# Patient Record
Sex: Male | Born: 1980 | Race: White | Hispanic: No | Marital: Married | Smoking: Never smoker
Health system: Southern US, Community
[De-identification: ages and names within clinical notes are randomized; demographics above are authoritative.]

## PROBLEM LIST (undated history)

## (undated) DIAGNOSIS — F419 Anxiety disorder, unspecified: Secondary | ICD-10-CM

## (undated) DIAGNOSIS — J45909 Unspecified asthma, uncomplicated: Secondary | ICD-10-CM

## (undated) DIAGNOSIS — I10 Essential (primary) hypertension: Secondary | ICD-10-CM

## (undated) DIAGNOSIS — K219 Gastro-esophageal reflux disease without esophagitis: Secondary | ICD-10-CM

## (undated) DIAGNOSIS — H3560 Retinal hemorrhage, unspecified eye: Secondary | ICD-10-CM

## (undated) DIAGNOSIS — K602 Anal fissure, unspecified: Secondary | ICD-10-CM

## (undated) DIAGNOSIS — T7840XA Allergy, unspecified, initial encounter: Secondary | ICD-10-CM

## (undated) DIAGNOSIS — B019 Varicella without complication: Secondary | ICD-10-CM

## (undated) HISTORY — DX: Allergy, unspecified, initial encounter: T78.40XA

## (undated) HISTORY — DX: Anal fissure, unspecified: K60.2

## (undated) HISTORY — DX: Varicella without complication: B01.9

## (undated) HISTORY — PX: PLANTAR FASCIA RELEASE: SHX2239

## (undated) HISTORY — DX: Gastro-esophageal reflux disease without esophagitis: K21.9

## (undated) HISTORY — DX: Unspecified asthma, uncomplicated: J45.909

## (undated) HISTORY — PX: NO PAST SURGERIES: SHX2092

## (undated) HISTORY — PX: HERNIA REPAIR: SHX51

## (undated) HISTORY — DX: Essential (primary) hypertension: I10

## (undated) HISTORY — DX: Anxiety disorder, unspecified: F41.9

## (undated) HISTORY — DX: Retinal hemorrhage, unspecified eye: H35.60

---

## 2017-09-02 DIAGNOSIS — H3560 Retinal hemorrhage, unspecified eye: Secondary | ICD-10-CM

## 2017-09-02 HISTORY — DX: Retinal hemorrhage, unspecified eye: H35.60

## 2017-09-02 LAB — HM DIABETES EYE EXAM

## 2017-09-12 ENCOUNTER — Ambulatory Visit (INDEPENDENT_AMBULATORY_CARE_PROVIDER_SITE_OTHER): Payer: Managed Care, Other (non HMO) | Admitting: Family Medicine

## 2017-09-12 ENCOUNTER — Encounter: Payer: Self-pay | Admitting: Family Medicine

## 2017-09-12 VITALS — BP 143/82 | HR 58 | Temp 98.1°F | Resp 20 | Ht 74.5 in | Wt 383.0 lb

## 2017-09-12 DIAGNOSIS — Z131 Encounter for screening for diabetes mellitus: Secondary | ICD-10-CM | POA: Diagnosis not present

## 2017-09-12 DIAGNOSIS — Q159 Congenital malformation of eye, unspecified: Secondary | ICD-10-CM | POA: Insufficient documentation

## 2017-09-12 DIAGNOSIS — I10 Essential (primary) hypertension: Secondary | ICD-10-CM | POA: Diagnosis not present

## 2017-09-12 DIAGNOSIS — Z7689 Persons encountering health services in other specified circumstances: Secondary | ICD-10-CM

## 2017-09-12 LAB — TSH: TSH: 1.13 u[IU]/mL (ref 0.35–4.50)

## 2017-09-12 LAB — COMPREHENSIVE METABOLIC PANEL
ALBUMIN: 4.4 g/dL (ref 3.5–5.2)
ALK PHOS: 55 U/L (ref 39–117)
ALT: 48 U/L (ref 0–53)
AST: 29 U/L (ref 0–37)
BUN: 13 mg/dL (ref 6–23)
CALCIUM: 9.4 mg/dL (ref 8.4–10.5)
CO2: 26 mEq/L (ref 19–32)
CREATININE: 0.87 mg/dL (ref 0.40–1.50)
Chloride: 106 mEq/L (ref 96–112)
GFR: 105.37 mL/min (ref >60.00)
Glucose, Bld: 94 mg/dL (ref 70–99)
Potassium: 4.5 mEq/L (ref 3.5–5.1)
SODIUM: 141 meq/L (ref 135–145)
Total Bilirubin: 0.6 mg/dL (ref 0.2–1.2)
Total Protein: 7 g/dL (ref 6.0–8.3)

## 2017-09-12 LAB — HEMOGLOBIN A1C: Hgb A1c MFr Bld: 5.9 % (ref 4.6–6.5)

## 2017-09-12 LAB — LIPID PANEL
CHOLESTEROL: 180 mg/dL (ref 0–200)
HDL: 36.5 mg/dL — ABNORMAL LOW (ref >39.00)
LDL Cholesterol: 123 mg/dL — ABNORMAL HIGH (ref 0–99)
NonHDL: 143.52
TRIGLYCERIDES: 101 mg/dL (ref 0.0–149.0)
Total CHOL/HDL Ratio: 5
VLDL: 20.2 mg/dL (ref 0.0–40.0)

## 2017-09-12 LAB — CBC
HCT: 48.7 % (ref 39.0–52.0)
HEMOGLOBIN: 16.4 g/dL (ref 13.0–17.0)
MCHC: 33.6 g/dL (ref 30.0–36.0)
MCV: 83.9 fl (ref 78.0–100.0)
Platelets: 266 10*3/uL (ref 150.0–400.0)
RBC: 5.81 Mil/uL (ref 4.22–5.81)
RDW: 13.9 % (ref 11.5–15.5)
WBC: 8.4 10*3/uL (ref 4.0–10.5)

## 2017-09-12 LAB — MICROALBUMIN / CREATININE URINE RATIO
Creatinine,U: 109.5 mg/dL
Microalb Creat Ratio: 0.6 mg/g (ref 0.0–30.0)

## 2017-09-12 MED ORDER — LISINOPRIL 5 MG PO TABS: 5.0000 mg | ORAL_TABLET | Freq: Every day | ORAL | 0 refills | Status: DC

## 2017-09-12 NOTE — Progress Notes (Signed)
Patient ID: Nicholas Anthony, male  DOB: 1980/09/05, 37 y.o.   MRN: [id number] Patient Care Team    Relationship Specialty Notifications Start End  Ma Hillock, DO PCP - General Family Medicine  09/12/17     Chief Complaint  Patient presents with  . Establish Care    Subjective:  Nicholas Anthony is a 37 y.o.  male present for new patient establishment. All past medical history, surgical history, allergies, family history, immunizations, medications and social history were obtained in the electronic medical record today. All recent labs, ED visits and hospitalizations within the last year were reviewed.  Hypertension/morbid obesity/ophthalmological abnormality:  New onset hypertension with elevated readings today, at prior UC setting and at home. recently at his eye doctor that told him he had "blood behind his eye" and needed to get checked. Patient denies chest pain, shortness of breath or lower extremity edema. Pt does not take a  daily baby ASA. Pt is is not  prescribed statin. BMP: ordered today CBC: ordered today Lipids: ordered today TSH: ordered today Urine microalb: ordered today Diet: low sodium Exercise: encouraged routinely.  RF: HTN, obesity, Fhx heart disease  Depression screen [facility] 2/9 09/12/2017  Decreased Interest 0  Down, Depressed, Hopeless 0  PHQ - 2 Score 0   No flowsheet data found.    Current Exercise Habits: The patient does not participate in regular exercise at present Exercise limited by: None identified Fall Risk  09/12/2017  Falls in the past year? No     Immunization History  Administered Date(s) Administered  . Influenza Split 03/30/2014  . Tdap 03/30/2014    No exam data present  Past Medical History:  Diagnosis Date  . Allergy   . Asthma   . Chicken pox    Allergies  Allergen Reactions  . Penicillins Other (See Comments)    Unsure has not taken since childhood Told he was allergic as a child Told he was allergic as a  child    Past Surgical History:  Procedure Laterality Date  . NO PAST SURGERIES     Family History  Problem Relation Age of Onset  . Diabetes Father   . Heart disease Father   . Hyperlipidemia Father   . Hypertension Father   . Drug abuse Sister   . Alcohol abuse Sister   . Arthritis Paternal Grandmother   . Depression Paternal Grandmother   . Diabetes Paternal Grandmother   . Hyperlipidemia Paternal Grandmother   . Heart disease Paternal Grandmother   . Hypertension Paternal Grandmother   . Arthritis Paternal Grandfather   . Hearing loss Paternal Grandfather   . Heart disease Paternal Grandfather   . Hyperlipidemia Paternal Grandfather   . Hypertension Paternal Grandfather   . Kidney disease Paternal Grandfather   . Drug abuse Brother    Social History   Socioeconomic History  . Marital status: Married    Spouse name: Not on file  . Number of children: Not on file  . Years of education: Not on file  . Highest education level: Not on file  Occupational History  . Not on file  Social Needs  . Financial resource strain: Not on file  . Food insecurity:    Worry: Not on file    Inability: Not on file  . Transportation needs:    Medical: Not on file    Non-medical: Not on file  Tobacco Use  . Smoking status: Never Smoker  . Smokeless tobacco: Never Used  Substance and Sexual Activity  . Alcohol use: Never    Frequency: Never  . Drug use: Never  . Sexual activity: Yes  Lifestyle  . Physical activity:    Days per week: Not on file    Minutes per session: Not on file  . Stress: Not on file  Relationships  . Social connections:    Talks on phone: Not on file    Gets together: Not on file    Attends religious service: Not on file    Active member of club or organization: Not on file    Attends meetings of clubs or organizations: Not on file    Relationship status: Not on file  . Intimate partner violence:    Fear of current or ex partner: Not on file     Emotionally abused: Not on file    Physically abused: Not on file    Forced sexual activity: Not on file  Other Topics Concern  . Not on file  Social History Narrative  . Not on file   Allergies as of 09/12/2017      Reactions   Penicillins Other (See Comments)   Unsure has not taken since childhood Told he was allergic as a child Told he was allergic as a child      Medication List        Accurate as of 09/12/17  5:08 PM. Always use your most recent med list.          lisinopril 5 MG tablet Commonly known as:  PRINIVIL,ZESTRIL Take 1 tablet (5 mg total) by mouth daily.       All past medical history, surgical history, allergies, family history, immunizations andmedications were updated in the EMR today and reviewed under the history and medication portions of their EMR.    No results found for this or any previous visit (from the past 2160 hour(s)).  Patient was never admitted.   ROS: 14 pt review of systems performed and negative (unless mentioned in an HPI)  Objective: BP (!) 143/82 (BP Location: Left Arm, Patient Position: Sitting, Cuff Size: Large)   Pulse (!) 58   Temp 98.1 F (36.7 C)   Resp 20   Ht 6' 2.5" (1.892 m)   Wt (!) 383 lb (173.7 kg)   SpO2 98%   BMI 48.52 kg/m  Gen: Afebrile. No acute distress. Nontoxic in appearance, well-developed, well-nourished, morbid obesity, very pleasant, caucasian male.  HENT: AT. [place].  MMM, no oral lesions, adequate dentition.no Cough on exam, no hoarseness on exam. Eyes:Pupils Equal Round Reactive to light, Extraocular movements intact,  Conjunctiva without redness, discharge or icterus. Neck/lymp/endocrine: Supple,no lymphadenopathy, no thyromegaly CV: RRR no murmur, no edema, +2/4 P posterior tibialis pulses. no carotid bruits. No JVD. Chest: CTAB, no wheeze, rhonchi or crackles. normal Respiratory effort. good Air movement. Abd: Soft. obese. NTND. BS present. no Masses palpated. No hepatosplenomegaly. No rebound  tenderness or guarding. Skin: no rashes, purpura or petechiae. Warm and well-perfused. Skin intact. Neuro/Msk:  Normal gait. PERLA. EOMi. Alert. Oriented x3.  Cranial nerves II through XII intact. Muscle strength 5/5 upper/lower extremity. DTRs equal bilaterally. Psych: Normal affect, dress and demeanor. Normal speech. Normal thought content and judgment.   Assessment/plan: Nicholas Anthony is a 37 y.o. male present for establishment with new onset HTN. Essential hypertension/Ophthalmologic abnormality/ Morbid obesity ([place]) - start lisinopril 5 mg QD. - low sodium, increase exercise > 150 minutes a week.  - CBC - Comp Met (CMET) - Lipid panel -  TSH - Urine Microalbumin w/creat. ratio - HgB A1c - f/u 2 weeks with provider appt.    Return in about 2 weeks (around 09/26/2017) for HTN'.   Note is dictated utilizing voice recognition software. Although note has been proof read prior to signing, occasional typographical errors still can be missed. If any questions arise, please do not hesitate to call for verification.  Electronically signed by: Howard Pouch, DO [place]

## 2017-09-12 NOTE — Patient Instructions (Signed)
Start lisinopril 5 mg a day. Follow up in 2 weeks for provider appt.  We will call you with all lab results.    Hypertension Hypertension is another name for high blood pressure. High blood pressure forces your heart to work harder to pump blood. This can cause problems over time. There are two numbers in a blood pressure reading. There is a top number (systolic) over a bottom number (diastolic). It is best to have a blood pressure below 120/80. Healthy choices can help lower your blood pressure. You may need medicine to help lower your blood pressure if:  Your blood pressure cannot be lowered with healthy choices.  Your blood pressure is higher than 130/80.  Follow these instructions at home: Eating and drinking  If directed, follow the DASH eating plan. This diet includes: ? Filling half of your plate at each meal with fruits and vegetables. ? Filling one quarter of your plate at each meal with whole grains. Whole grains include whole wheat pasta, brown rice, and whole grain bread. ? Eating or drinking low-fat dairy products, such as skim milk or low-fat yogurt. ? Filling one quarter of your plate at each meal with low-fat (lean) proteins. Low-fat proteins include fish, skinless chicken, eggs, beans, and tofu. ? Avoiding fatty meat, cured and processed meat, or chicken with skin. ? Avoiding premade or processed food.  Eat less than 1,500 mg of salt (sodium) a day.  Limit alcohol use to no more than 1 drink a day for nonpregnant women and 2 drinks a day for men. One drink equals 12 oz of beer, 5 oz of wine, or 1 oz of hard liquor. Lifestyle  Work with your doctor to stay at a healthy weight or to lose weight. Ask your doctor what the best weight is for you.  Get at least 30 minutes of exercise that causes your heart to beat faster (aerobic exercise) most days of the week. This may include walking, swimming, or biking.  Get at least 30 minutes of exercise that strengthens your  muscles (resistance exercise) at least 3 days a week. This may include lifting weights or pilates.  Do not use any products that contain nicotine or tobacco. This includes cigarettes and e-cigarettes. If you need help quitting, ask your doctor.  Check your blood pressure at home as told by your doctor.  Keep all follow-up visits as told by your doctor. This is important. Medicines  Take over-the-counter and prescription medicines only as told by your doctor. Follow directions carefully.  Do not skip doses of blood pressure medicine. The medicine does not work as well if you skip doses. Skipping doses also puts you at risk for problems.  Ask your doctor about side effects or reactions to medicines that you should watch for. Contact a doctor if:  You think you are having a reaction to the medicine you are taking.  You have headaches that keep coming back (recurring).  You feel dizzy.  You have swelling in your ankles.  You have trouble with your vision. Get help right away if:  You get a very bad headache.  You start to feel confused.  You feel weak or numb.  You feel faint.  You get very bad pain in your: ? Chest. ? Belly (abdomen).  You throw up (vomit) more than once.  You have trouble breathing. Summary  Hypertension is another name for high blood pressure.  Making healthy choices can help lower blood pressure. If your blood pressure cannot  be controlled with healthy choices, you may need to take medicine. This information is not intended to replace advice given to you by your health care provider. Make sure you discuss any questions you have with your health care provider. Document Released: 08/25/2007 Document Revised: 02/04/2016 Document Reviewed: 02/04/2016 Elsevier Interactive Patient Education  [employer].

## 2017-09-13 ENCOUNTER — Encounter: Payer: Self-pay | Admitting: *Deleted

## 2017-09-13 ENCOUNTER — Telehealth: Payer: Self-pay | Admitting: Family Medicine

## 2017-09-13 NOTE — Telephone Encounter (Signed)
Please inform patient the following information: His labs overall look good.  - is a1c (diabetes screen) is 5.9, which does place him in the prediabetes range. No need for medication yet. Exercising > 150 min a week and lower sugar/carbohydrate meals will help decrease his chances of becoming a diabetic. We can discuss dietary modifications if he desires, at his follow up in a couple weeks.  His cholesterol is ok. He could use an increase in his "good" cholesterol, which is can be achieved by eating foods such as olive oil, salmon, whole grains, beans/legumes and exercise. Fish oil supplement of 1000 mg a day can be helpful.  Kidney function is good.  See him in 2 weeks at his follow up and we can discuss in more detail if he wants

## 2017-09-14 ENCOUNTER — Encounter: Payer: Self-pay | Admitting: *Deleted

## 2017-09-14 NOTE — Telephone Encounter (Signed)
Called patient left message for patient to return call 

## 2017-09-14 NOTE — Telephone Encounter (Signed)
Spoke with patient reviewed lab results and instructions. Patient verbalized understanding. 

## 2017-09-28 ENCOUNTER — Encounter: Payer: Self-pay | Admitting: Family Medicine

## 2017-09-28 ENCOUNTER — Ambulatory Visit (INDEPENDENT_AMBULATORY_CARE_PROVIDER_SITE_OTHER): Payer: Managed Care, Other (non HMO) | Admitting: Family Medicine

## 2017-09-28 VITALS — BP 129/84 | HR 70 | Resp 16 | Ht 74.5 in | Wt 369.0 lb

## 2017-09-28 DIAGNOSIS — I10 Essential (primary) hypertension: Secondary | ICD-10-CM | POA: Diagnosis not present

## 2017-09-28 DIAGNOSIS — R7303 Prediabetes: Secondary | ICD-10-CM

## 2017-09-28 MED ORDER — LISINOPRIL 5 MG PO TABS: 5.0000 mg | ORAL_TABLET | Freq: Every day | ORAL | 1 refills | Status: DC

## 2017-09-28 MED ORDER — TRIAMCINOLONE ACETONIDE 0.1 % EX CREA: 1.0000 "application " | TOPICAL_CREAM | Freq: Two times a day (BID) | CUTANEOUS | 0 refills | Status: DC

## 2017-09-28 NOTE — Progress Notes (Signed)
Patient ID: Nicholas Anthony, male  DOB: 06-24-1980, 37 y.o.   MRN: [id number] Patient Care Team    Relationship Specialty Notifications Start End  Ma Hillock, DO PCP - General Family Medicine  09/12/17     Chief Complaint  Patient presents with  . Follow-up    HTN    Subjective:  Nicholas Anthony is a 37 y.o.  male present for follow-up on hypertension  Hypertension/morbid obesity/ophthalmological abnormality:  New onset hypertension last visit.  Started lisinopril 5 mg daily last visit.  Patient is tolerating medication well.  He has started walking every evening and has cut back on carbohydrates and sugars in his diet.  Patient denies chest pain, shortness of breath, dizziness or lower extremity edema.  He has lost 14 pounds in the last 4 weeks. Pt does not take a  daily baby ASA. Pt is is not  prescribed statin. BMP: 09/12/2017 within normal limits CBC: 09/12/2017 within normal limits Lipids:09/12/2017 cholesterol 180, HDL 36, LDL 123, triglycerides 101 TSH: 09/12/2017 1.13 Microalbumin: 09/12/2017 within normal limits Urine microalb: ordered today Diet: low sodium Exercise: encouraged routinely.  RF: HTN, obesity, Fhx heart disease  Prediabetes: Mildly elevated A1c 09/12/2017 of 5.9, fasting glucose normal at 94.  Eczema: Patient reports long-standing history of eczema, mostly affects left palm.  Used steroid cream in the past and would like refills.  Depression screen Nicholas Anthony 2/9 09/12/2017  Decreased Interest 0  Down, Depressed, Hopeless 0  PHQ - 2 Score 0   No flowsheet data found.     Fall Risk  09/12/2017  Falls in the past year? No     Immunization History  Administered Date(s) Administered  . Influenza Split 03/30/2014  . Tdap 03/30/2014    No exam data present  Past Medical History:  Diagnosis Date  . Allergy   . Asthma   . Chicken pox   . Retinal hemorrhage 09/02/2017   Allergies  Allergen Reactions  . Penicillins Other (See Comments)    Unsure  has not taken since childhood Told he was allergic as a child Told he was allergic as a child    Past Surgical History:  Procedure Laterality Date  . NO PAST SURGERIES     Family History  Problem Relation Age of Onset  . Diabetes Father   . Heart disease Father   . Hyperlipidemia Father   . Hypertension Father   . Drug abuse Sister   . Alcohol abuse Sister   . Arthritis Paternal Grandmother   . Depression Paternal Grandmother   . Diabetes Paternal Grandmother   . Hyperlipidemia Paternal Grandmother   . Heart disease Paternal Grandmother   . Hypertension Paternal Grandmother   . Arthritis Paternal Grandfather   . Hearing loss Paternal Grandfather   . Heart disease Paternal Grandfather   . Hyperlipidemia Paternal Grandfather   . Hypertension Paternal Grandfather   . Kidney disease Paternal Grandfather   . Drug abuse Brother    Social History   Socioeconomic History  . Marital status: Married    Spouse name: Not on file  . Number of children: Not on file  . Years of education: Not on file  . Highest education level: Not on file  Occupational History  . Not on file  Social Needs  . Financial resource strain: Not on file  . Food insecurity:    Worry: Not on file    Inability: Not on file  . Transportation needs:    Medical: Not on  file    Non-medical: Not on file  Tobacco Use  . Smoking status: Never Smoker  . Smokeless tobacco: Never Used  Substance and Sexual Activity  . Alcohol use: Never    Frequency: Never  . Drug use: Never  . Sexual activity: Yes    Partners: Female  Lifestyle  . Physical activity:    Days per week: Not on file    Minutes per session: Not on file  . Stress: Not on file  Relationships  . Social connections:    Talks on phone: Not on file    Gets together: Not on file    Attends religious service: Not on file    Active member of club or organization: Not on file    Attends meetings of clubs or organizations: Not on file     Relationship status: Not on file  . Intimate partner violence:    Fear of current or ex partner: Not on file    Emotionally abused: Not on file    Physically abused: Not on file    Forced sexual activity: Not on file  Other Topics Concern  . Not on file  Social History Narrative   Married, employed,    In college. Currently working as a Engineer, building services.    Drinks caffeine.    Smoke alarm in the home. Wears his seatbelt.    Feels safe in his relationship.    Allergies as of 09/28/2017      Reactions   Penicillins Other (See Comments)   Unsure has not taken since childhood Told he was allergic as a child Told he was allergic as a child      Medication List        Accurate as of 09/28/17  9:32 AM. Always use your most recent med list.          lisinopril 5 MG tablet Commonly known as:  PRINIVIL,ZESTRIL Take 1 tablet (5 mg total) by mouth daily.       All past medical history, surgical history, allergies, family history, immunizations andmedications were updated in the EMR today and reviewed under the history and medication portions of their EMR.    Recent Results (from the past 2160 hour(s))  HM DIABETES EYE EXAM     Status: None   Collection Time: 09/02/17 12:00 AM  Result Value Ref Range   HM Diabetic Eye Exam No Retinopathy No Retinopathy  CBC     Status: None   Collection Time: 09/12/17 10:03 AM  Result Value Ref Range   WBC 8.4 4.0 - 10.5 K/uL   RBC 5.81 4.22 - 5.81 Mil/uL   Platelets 266.0 150.0 - 400.0 K/uL   Hemoglobin 16.4 13.0 - 17.0 g/dL   HCT 48.7 39.0 - 52.0 %   MCV 83.9 78.0 - 100.0 fl   MCHC 33.6 30.0 - 36.0 g/dL   RDW 13.9 11.5 - 15.5 %  HgB A1c     Status: None   Collection Time: 09/12/17 10:03 AM  Result Value Ref Range   Hgb A1c MFr Bld 5.9 4.6 - 6.5 %    Comment: Glycemic Control Guidelines for People with Diabetes:Non Diabetic:  <6%Goal of Therapy: <7%Additional Action Suggested:  >8%   Comp Met (CMET)     Status: None   Collection Time:  09/12/17 10:03 AM  Result Value Ref Range   Sodium 141 135 - 145 mEq/L   Potassium 4.5 3.5 - 5.1 mEq/L   Chloride 106 96 - 112 mEq/L  CO2 26 19 - 32 mEq/L   Glucose, Bld 94 70 - 99 mg/dL   BUN 13 6 - 23 mg/dL   Creatinine, Ser 0.87 0.40 - 1.50 mg/dL   Total Bilirubin 0.6 0.2 - 1.2 mg/dL   Alkaline Phosphatase 55 39 - 117 U/L   AST 29 0 - 37 U/L   ALT 48 0 - 53 U/L   Total Protein 7.0 6.0 - 8.3 g/dL   Albumin 4.4 3.5 - 5.2 g/dL   Calcium 9.4 8.4 - 10.5 mg/dL   GFR 105.37 >60.00 mL/min  Lipid panel     Status: Abnormal   Collection Time: 09/12/17 10:03 AM  Result Value Ref Range   Cholesterol 180 0 - 200 mg/dL    Comment: ATP III Classification       Desirable:  < 200 mg/dL               Borderline High:  200 - 239 mg/dL          High:  > = 240 mg/dL   Triglycerides 101.0 0.0 - 149.0 mg/dL    Comment: Normal:  <150 mg/dLBorderline High:  150 - 199 mg/dL   HDL 36.50 (L) >39.00 mg/dL   VLDL 20.2 0.0 - 40.0 mg/dL   LDL Cholesterol 123 (H) 0 - 99 mg/dL   Total CHOL/HDL Ratio 5     Comment:                Men          Women1/2 Average Risk     3.4          3.3Average Risk          5.0          4.42X Average Risk          9.6          7.13X Average Risk          15.0          11.0                       NonHDL 143.52     Comment: NOTE:  Non-HDL goal should be 30 mg/dL higher than patient's LDL goal (i.e. LDL goal of < 70 mg/dL, would have non-HDL goal of < 100 mg/dL)  TSH     Status: None   Collection Time: 09/12/17 10:03 AM  Result Value Ref Range   TSH 1.13 0.35 - 4.50 uIU/mL  Urine Microalbumin w/creat. ratio     Status: None   Collection Time: 09/12/17 10:03 AM  Result Value Ref Range   Microalb, Ur <0.7 0.0 - 1.9 mg/dL   Creatinine,U 109.5 mg/dL   Microalb Creat Ratio 0.6 0.0 - 30.0 mg/g    Patient was never admitted.   ROS: 14 pt review of systems performed and negative (unless mentioned in an HPI)  Objective: BP 129/84 (BP Location: Left Arm, Patient Position: Sitting,  Cuff Size: Large)   Pulse 70   Resp 16   Ht 6' 2.5" (1.892 m)   Wt (!) 369 lb (167.4 kg)   SpO2 97%   BMI 46.74 kg/m  Gen: Afebrile. No acute distress.  Nontoxic in appearance, well-developed, well-nourished, very pleasant obese Caucasian male. HENT: AT. [place].  MMM.  Eyes:Pupils Equal Round Reactive to light, Extraocular movements intact,  Conjunctiva without redness, discharge or icterus. Neck/lymp/endocrine: Supple, no lymphadenopathy, no thyromegaly CV: RRR no murmur, no edema, +2/4  P posterior tibialis pulses Chest: CTAB, no wheeze or crackles Abd: Soft.  Obese. NTND. BS active.  Skin: dry flaky skin left palm.  Neuro:  Normal gait. PERLA. EOMi. Alert. Oriented.  Psych: Normal affect, dress and demeanor. Normal speech. Normal thought content and judgment.  Assessment/plan: Jancarlos Thrun is a 37 y.o. male present for establishment with new onset HTN. Essential hypertension/Ophthalmologic abnormality/ Morbid obesity ([place]) -Stable.  Continue lisinopril 5 mg QD.  Refills provided today. - low sodium, increase exercise > 150 minutes a week.  Doing great has lost 14 pounds. -Follow-up in 6 months.  Eczema: -New problem to this provider, chronic condition to patient.  Has used steroid cream in the past.  Will provide Kenalog cream.  Recommended patient use cetaphil or cerave cream after showers.    Return in about 6 months (around 03/31/2018) for HTN'.   Note is dictated utilizing voice recognition software. Although note has been proof read prior to signing, occasional typographical errors still can be missed. If any questions arise, please do not hesitate to call for verification.  Electronically signed by: Howard Pouch, DO [place]

## 2017-09-28 NOTE — Patient Instructions (Signed)
Great job on the weight loss. Refilled medicines for you. Follow up in 6 months.    Hypertension Hypertension is another name for high blood pressure. High blood pressure forces your heart to work harder to pump blood. This can cause problems over time. There are two numbers in a blood pressure reading. There is a top number (systolic) over a bottom number (diastolic). It is best to have a blood pressure below 120/80. Healthy choices can help lower your blood pressure. You may need medicine to help lower your blood pressure if:  Your blood pressure cannot be lowered with healthy choices.  Your blood pressure is higher than 130/80.  Follow these instructions at home: Eating and drinking  If directed, follow the DASH eating plan. This diet includes: ? Filling half of your plate at each meal with fruits and vegetables. ? Filling one quarter of your plate at each meal with whole grains. Whole grains include whole wheat pasta, brown rice, and whole grain bread. ? Eating or drinking low-fat dairy products, such as skim milk or low-fat yogurt. ? Filling one quarter of your plate at each meal with low-fat (lean) proteins. Low-fat proteins include fish, skinless chicken, eggs, beans, and tofu. ? Avoiding fatty meat, cured and processed meat, or chicken with skin. ? Avoiding premade or processed food.  Eat less than 1,500 mg of salt (sodium) a day.  Limit alcohol use to no more than 1 drink a day for nonpregnant women and 2 drinks a day for men. One drink equals 12 oz of beer, 5 oz of wine, or 1 oz of hard liquor. Lifestyle  Work with your doctor to stay at a healthy weight or to lose weight. Ask your doctor what the best weight is for you.  Get at least 30 minutes of exercise that causes your heart to beat faster (aerobic exercise) most days of the week. This may include walking, swimming, or biking.  Get at least 30 minutes of exercise that strengthens your muscles (resistance exercise) at  least 3 days a week. This may include lifting weights or pilates.  Do not use any products that contain nicotine or tobacco. This includes cigarettes and e-cigarettes. If you need help quitting, ask your doctor.  Check your blood pressure at home as told by your doctor.  Keep all follow-up visits as told by your doctor. This is important. Medicines  Take over-the-counter and prescription medicines only as told by your doctor. Follow directions carefully.  Do not skip doses of blood pressure medicine. The medicine does not work as well if you skip doses. Skipping doses also puts you at risk for problems.  Ask your doctor about side effects or reactions to medicines that you should watch for. Contact a doctor if:  You think you are having a reaction to the medicine you are taking.  You have headaches that keep coming back (recurring).  You feel dizzy.  You have swelling in your ankles.  You have trouble with your vision. Get help right away if:  You get a very bad headache.  You start to feel confused.  You feel weak or numb.  You feel faint.  You get very bad pain in your: ? Chest. ? Belly (abdomen).  You throw up (vomit) more than once.  You have trouble breathing. Summary  Hypertension is another name for high blood pressure.  Making healthy choices can help lower blood pressure. If your blood pressure cannot be controlled with healthy choices, you may need  to take medicine. This information is not intended to replace advice given to you by your health care provider. Make sure you discuss any questions you have with your health care provider. Document Released: 08/25/2007 Document Revised: 02/04/2016 Document Reviewed: 02/04/2016 Elsevier Interactive Patient Education  [employer].

## 2017-10-03 ENCOUNTER — Encounter: Payer: Self-pay | Admitting: Family Medicine

## 2017-10-03 DIAGNOSIS — R7303 Prediabetes: Secondary | ICD-10-CM | POA: Insufficient documentation

## 2017-10-03 HISTORY — DX: Prediabetes: R73.03

## 2017-11-25 ENCOUNTER — Encounter: Payer: Self-pay | Admitting: Family Medicine

## 2017-11-25 ENCOUNTER — Ambulatory Visit (INDEPENDENT_AMBULATORY_CARE_PROVIDER_SITE_OTHER): Payer: Managed Care, Other (non HMO) | Admitting: Family Medicine

## 2017-11-25 VITALS — BP 134/84 | HR 76 | Temp 97.9°F | Resp 20 | Ht 75.0 in | Wt 349.2 lb

## 2017-11-25 DIAGNOSIS — I10 Essential (primary) hypertension: Secondary | ICD-10-CM | POA: Diagnosis not present

## 2017-11-25 DIAGNOSIS — R41840 Attention and concentration deficit: Secondary | ICD-10-CM

## 2017-11-25 DIAGNOSIS — Z23 Encounter for immunization: Secondary | ICD-10-CM | POA: Diagnosis not present

## 2017-11-25 DIAGNOSIS — F419 Anxiety disorder, unspecified: Secondary | ICD-10-CM | POA: Insufficient documentation

## 2017-11-25 HISTORY — DX: Attention and concentration deficit: R41.840

## 2017-11-25 MED ORDER — LISINOPRIL 10 MG PO TABS: 10.0000 mg | ORAL_TABLET | Freq: Every day | ORAL | 1 refills | Status: DC

## 2017-11-25 MED ORDER — VENLAFAXINE HCL ER 37.5 MG PO CP24: ORAL_CAPSULE | ORAL | 0 refills | Status: DC

## 2017-11-25 NOTE — Progress Notes (Addendum)
Nicholas Anthony , [birth year], 37 y.o., male MRN: [id number] Patient Care Team    Relationship Specialty Notifications Start End  Natalia Leatherwood, DO PCP - General Family Medicine  09/12/17     Chief Complaint  Patient presents with  . trouble focusing    wants to be evaluated for ADHD     Subjective: Pt presents for an OV with complaints of occult he staying focused.  He states he had trouble as a kid but was never formally diagnosed with ADHD or had treatment.  His brothers were diagnosed with ADHD and had treatment.  He states he has gone back to school and is having difficulty staying focused.  He reports that his mind is racing and he has trouble staying on tasks and getting assignments completed.  Has difficulty retaining what he reads because he is thinking of other things as he is reading.  He reports he is unable to "shut off the other parts of his brain "when needing to focus.    Depression screen [facility] 2/9 09/12/2017  Decreased Interest 0  Down, Depressed, Hopeless 0  PHQ - 2 Score 0    Allergies  Allergen Reactions  . Penicillins Other (See Comments)    Unsure has not taken since childhood Told he was allergic as a child Told he was allergic as a child    Social History   Tobacco Use  . Smoking status: Never Smoker  . Smokeless tobacco: Never Used  Substance Use Topics  . Alcohol use: Never    Frequency: Never   Past Medical History:  Diagnosis Date  . Allergy   . Asthma   . Chicken pox   . 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 NO PAST SURGERIES     Family History  Problem Relation Age of Onset  . Diabetes Father   . Heart disease Father   . Hyperlipidemia Father   . Hypertension Father   . Drug abuse Sister   . Alcohol abuse Sister   . Arthritis Paternal Grandmother   . Depression Paternal Grandmother   . Diabetes Paternal Grandmother   . Hyperlipidemia Paternal Grandmother   . Heart disease Paternal  Grandmother   . Hypertension Paternal Grandmother   . Arthritis Paternal Grandfather   . Hearing loss Paternal Grandfather   . Heart disease Paternal Grandfather   . Hyperlipidemia Paternal Grandfather   . Hypertension Paternal Grandfather   . Kidney disease Paternal Grandfather   . Drug abuse Brother    Allergies as of 11/25/2017      Reactions   Penicillins Other (See Comments)   Unsure has not taken since childhood Told he was allergic as a child Told he was allergic as a child      Medication List        Accurate as of 11/25/17  3:24 PM. Always use your most recent med list.          lisinopril 5 MG tablet Commonly known as:  PRINIVIL,ZESTRIL Take 1 tablet (5 mg total) by mouth daily.   triamcinolone cream 0.1 % Commonly known as:  KENALOG Apply 1 application topically 2 (two) times daily.       All past medical history, surgical history, allergies, family history, immunizations andmedications were updated in the EMR today and reviewed under the history and medication portions of their EMR.     ROS: Negative, with the exception of above mentioned in HPI  Objective:  BP 134/84 (BP Location: Right Arm, Patient Position: Sitting, Cuff Size: Large)   Pulse 76   Temp 97.9 F (36.6 C)   Resp 20   Ht 6\' 3"  (1.905 m)   Wt (!) 349 lb 4 oz (158.4 kg)   SpO2 97%   BMI 43.65 kg/m  Body mass index is 43.65 kg/m. Gen: Afebrile. No acute distress. Nontoxic in appearance, well developed, well nourished.  HENT: AT. [place].  MMM Eyes:Pupils Equal Round Reactive to light, Extraocular movements intact,  Conjunctiva without redness, discharge or icterus. CV: RRR no murmur, no edema Chest: CTAB, no wheeze or crackles. Good air movement, normal resp effort.  Abd: Soft.  Obese. NTND. BS +.  Neuro:  Normal gait. PERLA. EOMi. Alert. Oriented x3  Psych: Normal affect, dress and demeanor. Normal speech. Normal thought content and judgment.  No exam data present No results found. No  results found for this or any previous visit (from the past 24 hour(s)).  Assessment/Plan: Jarmal Lewelling is a 37 y.o. male present for OV for  Immunization due - Flu Vaccine QUAD 6+ mos PF IM (Fluarix Quad PF)  Attention deficit/Mild anxiety -Lengthy discussion today on different types of medications that can be used for treating off label for attention deficits and mild anxiety.  He is agreeable to try Effexor.  Effexor taper was prescribed today with instructions. Follow-up in 4 weeks.  Essential hypertension Blood pressures are still borderline.  Increase lisinopril to 10 mg daily.  New prescription prescribed for him today. -low Sodium diet.  Increase exercise.   Reviewed expectations re: course of current medical issues.  Discussed self-management of symptoms.  Outlined signs and symptoms indicating need for more acute intervention.  Patient verbalized understanding and all questions were answered.  Patient received an After-Visit Summary.    Orders Placed This Encounter  Procedures  . Flu Vaccine QUAD 6+ mos PF IM (Fluarix Quad PF)    > 25 minutes spent with patient, >50% of time spent face to face counseling   Note is dictated utilizing voice recognition software. Although note has been proof read prior to signing, occasional typographical errors still can be missed. If any questions arise, please do not hesitate to call for verification.   electronically signed by:  Felix Pacini, DO  [facility] - [place]

## 2017-11-25 NOTE — Patient Instructions (Addendum)
Start effexor taper as directions state.  Increase lisinopril to 10 mg a day ( take 2- of the 5 mg tabs you have at home). New script will be 10 mg per tab (takeone then).    Follow up in 4 weeks.    Please help [place] help you:  We are honored you have chosen [facility] for your Primary Care home. Below you will find basic instructions that you may need to access in the future. Please help [place] help you by reading the instructions, which cover many of the frequent questions we experience.   Prescription refills and request:  -In order to allow more efficient response time, please call your pharmacy for all refills. They will forward the request electronically to [place]. This allows for the quickest possible response. Request left on a nurse line can take longer to refill, since these are checked as time allows between office patients and other phone calls.  - refill request can take up to 3-5 working days to complete.  - If request is sent electronically and request is appropiate, it is usually completed in 1-2 business days.  - all patients will need to be seen routinely for all chronic medical conditions requiring prescription medications (see follow-up below). If you are overdue for follow up on your condition, you will be asked to make an appointment and we will call in enough medication to cover you until your appointment (up to 30 days).  - all controlled substances will require a face to face visit to request/refill.  - if you desire your prescriptions to go through a new pharmacy, and have an active script at original pharmacy, you will need to call your pharmacy and have scripts transferred to new pharmacy. This is completed between the pharmacy locations and not by your provider.    Results: If any images or labs were ordered, it can take up to 1 week to get results depending on the test ordered and the lab/facility running and resulting the test. - Normal or stable results, which do not  need further discussion, may be released to your mychart immediately with attached note to you. A call may not be generated for normal results. Please make certain to sign up for mychart. If you have questions on how to activate your mychart you can call the front office.  - If your results need further discussion, our office will attempt to contact you via phone, and if unable to reach you after 2 attempts, we will release your abnormal result to your mychart with instructions.  - All results will be automatically released in mychart after 1 week.  - Your provider will provide you with explanation and instruction on all relevant material in your results. Please keep in mind, results and labs may appear confusing or abnormal to the untrained eye, but it does not mean they are actually abnormal for you personally. If you have any questions about your results that are not covered, or you desire more detailed explanation than what was provided, you should make an appointment with your provider to do so.   Our office handles many outgoing and incoming calls daily. If we have not contacted you within 1 week about your results, please check your mychart to see if there is a message first and if not, then contact our office.  In helping with this matter, you help decrease call volume, and therefore allow [place] to be able to respond to patients needs more efficiently.  Acute office visits (sick visit):  An acute visit is intended for a new problem and are scheduled in shorter time slots to allow schedule openings for patients with new problems. This is the appropriate visit to discuss a new problem. Problems will not be addressed by phone call or Echart message. Appointment is needed if requesting treatment. In order to provide you with excellent quality medical care with proper time for you to explain your problem, have an exam and receive treatment with instructions, these appointments should be limited to one new  problem per visit. If you experience a new problem, in which you desire to be addressed, please make an acute office visit, we save openings on the schedule to accommodate you. Please do not save your new problem for any other type of visit, let us take care of it properly and quickly for you.   Follow up visits:  Depending on your condition(s) your provider will need to see you routinely in order to provide you with quality care and prescribe medication(s). Most chronic conditions (Example: hypertension, Diabetes, depression/anxiety... etc), require visits a couple times a year. Your provider will instruct you on proper follow up for your personal medical conditions and history. Please make certain to make follow up appointments for your condition as instructed. Failing to do so could result in lapse in your medication treatment/refills. If you request a refill, and are overdue to be seen on a condition, we will always provide you with a 30 day script (once) to allow you time to schedule.    Medicare wellness (well visit): - we have a wonderful Nurse Maudie Mercury), that will meet with you and provide you will yearly medicare wellness visits. These visits should occur yearly (can not be scheduled less than 1 calendar year apart) and cover preventive health, immunizations, advance directives and screenings you are entitled to yearly through your medicare benefits. Do not miss out on your entitled benefits, this is when medicare will pay for these benefits to be ordered for you.  These are strongly encouraged by your provider and is the appropriate type of visit to make certain you are up to date with all preventive health benefits. If you have not had your medicare wellness exam in the last 12 months, please make certain to schedule one by calling the office and schedule your medicare wellness with Maudie Mercury as soon as possible.   Yearly physical (well visit):  - Adults are recommended to be seen yearly for physicals.  Check with your insurance and date of your last physical, most insurances require one calendar year between physicals. Physicals include all preventive health topics, screenings, medical exam and labs that are appropriate for gender/age and history. You may have fasting labs needed at this visit. This is a well visit (not a sick visit), new problems should not be covered during this visit (see acute visit).  - Pediatric patients are seen more frequently when they are younger. Your provider will advise you on well child visit timing that is appropriate for your their age. - This is not a medicare wellness visit. Medicare wellness exams do not have an exam portion to the visit. Some medicare companies allow for a physical, some do not allow a yearly physical. If your medicare allows a yearly physical you can schedule the medicare wellness with our nurse Maudie Mercury and have your physical with your provider after, on the same day. Please check with insurance for your full benefits.   Late Policy/No Shows:  -  all new patients should arrive 15-30 minutes earlier than appointment to allow [place] time  to  obtain all personal demographics,  insurance information and for you to complete office paperwork. - All established patients should arrive 10-15 minutes earlier than appointment time to update all information and be checked in .  - In our best efforts to run on time, if you are late for your appointment you will be asked to either reschedule or if able, we will work you back into the schedule. There will be a wait time to work you back in the schedule,  depending on availability.  - If you are unable to make it to your appointment as scheduled, please call 24 hours ahead of time to allow [place] to fill the time slot with someone else who needs to be seen. If you do not cancel your appointment ahead of time, you may be charged a no show fee.

## 2017-12-23 ENCOUNTER — Ambulatory Visit (INDEPENDENT_AMBULATORY_CARE_PROVIDER_SITE_OTHER): Payer: Managed Care, Other (non HMO) | Admitting: Family Medicine

## 2017-12-23 ENCOUNTER — Encounter: Payer: Self-pay | Admitting: Family Medicine

## 2017-12-23 VITALS — BP 128/86 | HR 66 | Temp 98.2°F | Resp 20 | Ht 75.0 in | Wt 327.0 lb

## 2017-12-23 DIAGNOSIS — F419 Anxiety disorder, unspecified: Secondary | ICD-10-CM | POA: Diagnosis not present

## 2017-12-23 DIAGNOSIS — R41840 Attention and concentration deficit: Secondary | ICD-10-CM

## 2017-12-23 MED ORDER — VENLAFAXINE HCL ER 75 MG PO CP24: 75.0000 mg | ORAL_CAPSULE | Freq: Every day | ORAL | 0 refills | Status: DC

## 2017-12-23 NOTE — Patient Instructions (Signed)
New script for effexor 75 mg a day prescribed. The new pill is 75 mg in each pill, just take 1 now.  If you are doing well, follow up in 3 months. If you feel you are not doing as well as you would like at the end of October, make an appt for early November so we can adjust meds.

## 2017-12-23 NOTE — Progress Notes (Signed)
Nicholas Anthony , [birth year], 37 y.o., male MRN: [id number] Patient Care Team    Relationship Specialty Notifications Start End  Natalia Leatherwood, DO PCP - General Family Medicine  09/12/17     Chief Complaint  Patient presents with  . ADD  . Anxiety     Subjective:  Pt reports he is doing well on the tapering of effexor. No negative side effects to reports. He has noticed a decrease in his appetite, which is ok with him. He has not noticed much anxiety or attention benefit yet- but maybe a small amount. He is on his 2nd week of therapeutic dose.  Prior note:  Pt presents for an OV with complaints of difficulty staying focused.  He states he had trouble as a kid but was never formally diagnosed with ADHD or had treatment.  His brothers were diagnosed with ADHD and had treatment.  He states he has gone back to school and is having difficulty staying focused.  He reports that his mind is racing and he has trouble staying on tasks and getting assignments completed.  Has difficulty retaining what he reads because he is thinking of other things as he is reading.  He reports he is unable to "shut off the other parts of his brain "when needing to focus.    Depression screen [facility] 2/9 11/25/2017 09/12/2017  Decreased Interest 0 0  Down, Depressed, Hopeless 2 0  PHQ - 2 Score 2 0  Altered sleeping 0 -  Tired, decreased energy 0 -  Change in appetite 0 -  Feeling bad or failure about yourself  0 -  Trouble concentrating 3 -  Moving slowly or fidgety/restless 3 -  Suicidal thoughts 0 -  PHQ-9 Score 8 -  Difficult doing work/chores Very difficult -    Allergies  Allergen Reactions  . Penicillins Other (See Comments)    Unsure has not taken since childhood Told he was allergic as a child Told he was allergic as a child    Social History   Tobacco Use  . Smoking status: Never Smoker  . Smokeless tobacco: Never Used  Substance Use Topics  . Alcohol use: Never    Frequency: Never   Past  Medical History:  Diagnosis Date  . Allergy   . Asthma   . Chicken pox   . 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 NO PAST SURGERIES     Family History  Problem Relation Age of Onset  . Diabetes Father   . Heart disease Father   . Hyperlipidemia Father   . Hypertension Father   . Drug abuse Sister   . Alcohol abuse Sister   . Arthritis Paternal Grandmother   . Depression Paternal Grandmother   . Diabetes Paternal Grandmother   . Hyperlipidemia Paternal Grandmother   . Heart disease Paternal Grandmother   . Hypertension Paternal Grandmother   . Arthritis Paternal Grandfather   . Hearing loss Paternal Grandfather   . Heart disease Paternal Grandfather   . Hyperlipidemia Paternal Grandfather   . Hypertension Paternal Grandfather   . Kidney disease Paternal Grandfather   . Drug abuse Brother    Allergies as of 12/23/2017      Reactions   Penicillins Other (See Comments)   Unsure has not taken since childhood Told he was allergic as a child Told he was allergic as a child      Medication List  Accurate as of 12/23/17  3:50 PM. Always use your most recent med list.          lisinopril 10 MG tablet Commonly known as:  PRINIVIL,ZESTRIL Take 1 tablet (10 mg total) by mouth daily.   triamcinolone cream 0.1 % Commonly known as:  KENALOG Apply 1 application topically 2 (two) times daily.   venlafaxine XR 37.5 MG 24 hr capsule Commonly known as:  EFFEXOR-XR Take 1 capsule (37.5 mg total) by mouth daily with breakfast for 7 days, THEN 2 capsules (75 mg total) daily with breakfast for 23 days. Start taking on:  11/25/2017       All past medical history, surgical history, allergies, family history, immunizations andmedications were updated in the EMR today and reviewed under the history and medication portions of their EMR.     ROS: Negative, with the exception of above mentioned in HPI  Objective:  BP 128/86 (BP  Location: Right Arm, Patient Position: Sitting, Cuff Size: Large)   Pulse 66   Temp 98.2 F (36.8 C)   Resp 20   Ht 6\' 3"  (1.905 m)   Wt (!) 327 lb (148.3 kg)   SpO2 98%   BMI 40.87 kg/m  Body mass index is 40.87 kg/m. Gen: Afebrile. No acute distress. Very pleasant, caucasian male. Obese. HENT: AT. .  MMM.  Eyes:Pupils Equal Round Reactive to light, Extraocular movements intact,  Conjunctiva without redness, discharge or icterus. Neuro:  Normal gait. PERLA. EOMi. Alert. Oriented.  Psych: Normal affect, dress and demeanor. Normal speech. Normal thought content and judgment.  No exam data present No results found. No results found for this or any previous visit (from the past 24 hour(s)).  Assessment/Plan: Nicholas Anthony is a 37 y.o. male present for OV for  Attention deficit/Mild anxiety - continue Effexor 75 mg QD. Refills provided today. Only been on therapeutic dose for 2-3 weeks. Full benefit of this dose will be achieved within another 3-4 weeks.  - he is doing great on his weight loss.  - Discussed follow up in 3 months if doing well on this dose or in 4 weeks if not experiencing enough benefit.    Reviewed expectations re: course of current medical issues.  Discussed self-management of symptoms.  Outlined signs and symptoms indicating need for more acute intervention.  Patient verbalized understanding and all questions were answered.  Patient received an After-Visit Summary.    No orders of the defined types were placed in this encounter.   Note is dictated utilizing voice recognition software. Although note has been proof read prior to signing, occasional typographical errors still can be missed. If any questions arise, please do not hesitate to call for verification.   electronically signed by:  Felix Pacini, DO  [facility] [place]

## 2018-01-23 ENCOUNTER — Encounter: Payer: Self-pay | Admitting: Family Medicine

## 2018-01-23 ENCOUNTER — Ambulatory Visit (INDEPENDENT_AMBULATORY_CARE_PROVIDER_SITE_OTHER): Payer: Managed Care, Other (non HMO) | Admitting: Family Medicine

## 2018-01-23 VITALS — BP 136/82 | HR 80 | Temp 97.0°F | Resp 20 | Ht 75.0 in | Wt 316.0 lb

## 2018-01-23 DIAGNOSIS — F419 Anxiety disorder, unspecified: Secondary | ICD-10-CM

## 2018-01-23 DIAGNOSIS — R41840 Attention and concentration deficit: Secondary | ICD-10-CM | POA: Diagnosis not present

## 2018-01-23 MED ORDER — VENLAFAXINE HCL ER 150 MG PO CP24: 150.0000 mg | ORAL_CAPSULE | Freq: Every day | ORAL | 1 refills | Status: DC

## 2018-01-23 NOTE — Progress Notes (Signed)
Nicholas Anthony , [birth year], 37 y.o., male MRN: [id number] Patient Care Team    Relationship Specialty Notifications Start End  Natalia Leatherwood, DO PCP - General Family Medicine  09/12/17     Chief Complaint  Patient presents with  . Anxiety  . ADD     Subjective:  Patient reports he is doing ok on effexor 75 mg QD, but still feels anxious and inability to focus.  Pt reports he is doing well on the tapering of effexor. No negative side effects to reports. He has noticed a decrease in his appetite, which is ok with him. He has not noticed much anxiety or attention benefit yet- but maybe a small amount. He is on his 2nd week of therapeutic dose.  Prior note:  Pt presents for an OV with complaints of difficulty staying focused.  He states he had trouble as a kid but was never formally diagnosed with ADHD or had treatment.  His brothers were diagnosed with ADHD and had treatment.  He states he has gone back to school and is having difficulty staying focused.  He reports that his mind is racing and he has trouble staying on tasks and getting assignments completed.  Has difficulty retaining what he reads because he is thinking of other things as he is reading.  He reports he is unable to "shut off the other parts of his brain "when needing to focus.    Depression screen [facility] 2/9 11/25/2017 09/12/2017  Decreased Interest 0 0  Down, Depressed, Hopeless 2 0  PHQ - 2 Score 2 0  Altered sleeping 0 -  Tired, decreased energy 0 -  Change in appetite 0 -  Feeling bad or failure about yourself  0 -  Trouble concentrating 3 -  Moving slowly or fidgety/restless 3 -  Suicidal thoughts 0 -  PHQ-9 Score 8 -  Difficult doing work/chores Very difficult -   GAD 7 : Generalized Anxiety Score 01/23/2018 12/23/2017 11/25/2017  Nervous, Anxious, on Edge 3 0 0  Control/stop worrying 0 0 0  Worry too much - different things 0 0 0  Trouble relaxing 1 0 0  Restless 3 2 2   Easily annoyed or irritable 3 1 2     Afraid - awful might happen 0 0 0  Total GAD 7 Score 10 3 4   Anxiety Difficulty Extremely difficult Somewhat difficult Very difficult   Allergies  Allergen Reactions  . Penicillins Other (See Comments)    Unsure has not taken since childhood Told he was allergic as a child Told he was allergic as a child    Social History   Tobacco Use  . Smoking status: Never Smoker  . Smokeless tobacco: Never Used  Substance Use Topics  . Alcohol use: Never    Frequency: Never   Past Medical History:  Diagnosis Date  . Allergy   . Asthma   . Chicken pox   . 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 NO PAST SURGERIES     Family History  Problem Relation Age of Onset  . Diabetes Father   . Heart disease Father   . Hyperlipidemia Father   . Hypertension Father   . Drug abuse Sister   . Alcohol abuse Sister   . Arthritis Paternal Grandmother   . Depression Paternal Grandmother   . Diabetes Paternal Grandmother   . Hyperlipidemia Paternal Grandmother   . Heart disease Paternal Grandmother   . Hypertension Paternal  Grandmother   . Arthritis Paternal Grandfather   . Hearing loss Paternal Grandfather   . Heart disease Paternal Grandfather   . Hyperlipidemia Paternal Grandfather   . Hypertension Paternal Grandfather   . Kidney disease Paternal Grandfather   . Drug abuse Brother    Allergies as of 01/23/2018      Reactions   Penicillins Other (See Comments)   Unsure has not taken since childhood Told he was allergic as a child Told he was allergic as a child      Medication List        Accurate as of 01/23/18  2:52 PM. Always use your most recent med list.          lisinopril 10 MG tablet Commonly known as:  PRINIVIL,ZESTRIL Take 1 tablet (10 mg total) by mouth daily.   triamcinolone cream 0.1 % Commonly known as:  KENALOG Apply 1 application topically 2 (two) times daily.   venlafaxine XR 75 MG 24 hr capsule Commonly  known as:  EFFEXOR-XR Take 1 capsule (75 mg total) by mouth daily with breakfast.       All past medical history, surgical history, allergies, family history, immunizations andmedications were updated in the EMR today and reviewed under the history and medication portions of their EMR.     ROS: Negative, with the exception of above mentioned in HPI  Objective:  BP 136/82 (BP Location: Right Arm, Patient Position: Sitting, Cuff Size: Large)   Pulse 80   Temp (!) 97 F (36.1 C)   Resp 20   Ht 6\' 3"  (1.905 m)   Wt (!) 316 lb (143.3 kg)   SpO2 97%   BMI 39.50 kg/m  Body mass index is 39.5 kg/m. Gen: Afebrile. No acute distress.  HENT: AT. [place]. . MMM.  Eyes:Pupils Equal Round Reactive to light, Extraocular movements intact,  Conjunctiva without redness, discharge or icterus. CV: RRR  Chest: CTAB, no wheeze or crackles Neuro:  Normal gait. PERLA. EOMi. Alert. Oriented.  Psych: Normal affect, dress and demeanor. Normal speech. Normal thought content and judgment..    No exam data present No results found. No results found for this or any previous visit (from the past 24 hour(s)).  Assessment/Plan: Nicholas Anthony is a 37 y.o. male present for OV for  Attention deficit/Mild anxiety - increase Effexor 150 mg QD. Refills provided today. - he is doing great on his weight loss.  - he has an appt in 4 weeks. Could consider adding SSRI or vyvanse, depending on remaining symptoms present after increase in effexor is either anxiety or inattention.  - monitor BP w/ increasing effexor.    Reviewed expectations re: course of current medical issues.  Discussed self-management of symptoms.  Outlined signs and symptoms indicating need for more acute intervention.  Patient verbalized understanding and all questions were answered.  Patient received an After-Visit Summary.    No orders of the defined types were placed in this encounter.   Note is dictated utilizing voice recognition  software. Although note has been proof read prior to signing, occasional typographical errors still can be missed. If any questions arise, please do not hesitate to call for verification.   electronically signed by:  Felix Pacini, DO  [facility] - [place]

## 2018-01-23 NOTE — Patient Instructions (Addendum)
increase to effexor 150 mg a day.  We follow up at your 12/10 appt.  Please help [place] help you:  We are honored you have chosen [facility] for your Primary Care home. Below you will find basic instructions that you may need to access in the future. Please help [place] help you by reading the instructions, which cover many of the frequent questions we experience.   Prescription refills and request:  -In order to allow more efficient response time, please call your pharmacy for all refills. They will forward the request electronically to [place]. This allows for the quickest possible response. Request left on a nurse line can take longer to refill, since these are checked as time allows between office patients and other phone calls.  - refill request can take up to 3-5 working days to complete.  - If request is sent electronically and request is appropiate, it is usually completed in 1-2 business days.  - all patients will need to be seen routinely for all chronic medical conditions requiring prescription medications (see follow-up below). If you are overdue for follow up on your condition, you will be asked to make an appointment and we will call in enough medication to cover you until your appointment (up to 30 days).  - all controlled substances will require a face to face visit to request/refill.  - if you desire your prescriptions to go through a new pharmacy, and have an active script at original pharmacy, you will need to call your pharmacy and have scripts transferred to new pharmacy. This is completed between the pharmacy locations and not by your provider.    Results: If any images or labs were ordered, it can take up to 1 week to get results depending on the test ordered and the lab/facility running and resulting the test. - Normal or stable results, which do not need further discussion, may be released to your mychart immediately with attached note to you. A call may not be generated for normal  results. Please make certain to sign up for mychart. If you have questions on how to activate your mychart you can call the front office.  - If your results need further discussion, our office will attempt to contact you via phone, and if unable to reach you after 2 attempts, we will release your abnormal result to your mychart with instructions.  - All results will be automatically released in mychart after 1 week.  - Your provider will provide you with explanation and instruction on all relevant material in your results. Please keep in mind, results and labs may appear confusing or abnormal to the untrained eye, but it does not mean they are actually abnormal for you personally. If you have any questions about your results that are not covered, or you desire more detailed explanation than what was provided, you should make an appointment with your provider to do so.   Our office handles many outgoing and incoming calls daily. If we have not contacted you within 1 week about your results, please check your mychart to see if there is a message first and if not, then contact our office.  In helping with this matter, you help decrease call volume, and therefore allow [place] to be able to respond to patients needs more efficiently.   Acute office visits (sick visit):  An acute visit is intended for a new problem and are scheduled in shorter time slots to allow schedule openings for patients with new problems.  This is the appropriate visit to discuss a new problem. Problems will not be addressed by phone call or Echart message. Appointment is needed if requesting treatment. In order to provide you with excellent quality medical care with proper time for you to explain your problem, have an exam and receive treatment with instructions, these appointments should be limited to one new problem per visit. If you experience a new problem, in which you desire to be addressed, please make an acute office visit, we save  openings on the schedule to accommodate you. Please do not save your new problem for any other type of visit, let us take care of it properly and quickly for you.   Follow up visits:  Depending on your condition(s) your provider will need to see you routinely in order to provide you with quality care and prescribe medication(s). Most chronic conditions (Example: hypertension, Diabetes, depression/anxiety... etc), require visits a couple times a year. Your provider will instruct you on proper follow up for your personal medical conditions and history. Please make certain to make follow up appointments for your condition as instructed. Failing to do so could result in lapse in your medication treatment/refills. If you request a refill, and are overdue to be seen on a condition, we will always provide you with a 30 day script (once) to allow you time to schedule.    Medicare wellness (well visit): - we have a wonderful Nurse Maudie Mercury), that will meet with you and provide you will yearly medicare wellness visits. These visits should occur yearly (can not be scheduled less than 1 calendar year apart) and cover preventive health, immunizations, advance directives and screenings you are entitled to yearly through your medicare benefits. Do not miss out on your entitled benefits, this is when medicare will pay for these benefits to be ordered for you.  These are strongly encouraged by your provider and is the appropriate type of visit to make certain you are up to date with all preventive health benefits. If you have not had your medicare wellness exam in the last 12 months, please make certain to schedule one by calling the office and schedule your medicare wellness with Maudie Mercury as soon as possible.   Yearly physical (well visit):  - Adults are recommended to be seen yearly for physicals. Check with your insurance and date of your last physical, most insurances require one calendar year between physicals. Physicals  include all preventive health topics, screenings, medical exam and labs that are appropriate for gender/age and history. You may have fasting labs needed at this visit. This is a well visit (not a sick visit), new problems should not be covered during this visit (see acute visit).  - Pediatric patients are seen more frequently when they are younger. Your provider will advise you on well child visit timing that is appropriate for your their age. - This is not a medicare wellness visit. Medicare wellness exams do not have an exam portion to the visit. Some medicare companies allow for a physical, some do not allow a yearly physical. If your medicare allows a yearly physical you can schedule the medicare wellness with our nurse Maudie Mercury and have your physical with your provider after, on the same day. Please check with insurance for your full benefits.   Late Policy/No Shows:  - all new patients should arrive 15-30 minutes earlier than appointment to allow [place] time  to  obtain all personal demographics,  insurance information and for you to complete office  paperwork. - All established patients should arrive 10-15 minutes earlier than appointment time to update all information and be checked in .  - In our best efforts to run on time, if you are late for your appointment you will be asked to either reschedule or if able, we will work you back into the schedule. There will be a wait time to work you back in the schedule,  depending on availability.  - If you are unable to make it to your appointment as scheduled, please call 24 hours ahead of time to allow [place] to fill the time slot with someone else who needs to be seen. If you do not cancel your appointment ahead of time, you may be charged a no show fee.

## 2018-02-28 ENCOUNTER — Ambulatory Visit: Payer: Managed Care, Other (non HMO) | Admitting: Family Medicine

## 2018-03-22 HISTORY — PX: SPHINCTEROTOMY: SHX5279

## 2018-03-23 ENCOUNTER — Ambulatory Visit (INDEPENDENT_AMBULATORY_CARE_PROVIDER_SITE_OTHER): Payer: Managed Care, Other (non HMO) | Admitting: Family Medicine

## 2018-03-23 ENCOUNTER — Encounter: Payer: Self-pay | Admitting: Family Medicine

## 2018-03-23 VITALS — BP 133/83 | HR 69 | Temp 98.2°F | Resp 16 | Ht 75.0 in | Wt 301.0 lb

## 2018-03-23 DIAGNOSIS — R42 Dizziness and giddiness: Secondary | ICD-10-CM | POA: Diagnosis not present

## 2018-03-23 DIAGNOSIS — F419 Anxiety disorder, unspecified: Secondary | ICD-10-CM

## 2018-03-23 DIAGNOSIS — R51 Headache: Secondary | ICD-10-CM | POA: Diagnosis not present

## 2018-03-23 DIAGNOSIS — R41840 Attention and concentration deficit: Secondary | ICD-10-CM

## 2018-03-23 DIAGNOSIS — R519 Headache, unspecified: Secondary | ICD-10-CM

## 2018-03-23 LAB — GLUCOSE, POCT (MANUAL RESULT ENTRY): POC GLUCOSE: 87 mg/dL (ref 70–99)

## 2018-03-23 MED ORDER — FLUOXETINE HCL 20 MG PO TABS: ORAL_TABLET | ORAL | 2 refills | Status: DC

## 2018-03-23 MED ORDER — VENLAFAXINE HCL ER 75 MG PO CP24: 225.0000 mg | ORAL_CAPSULE | Freq: Every day | ORAL | 0 refills | Status: DC

## 2018-03-23 NOTE — Patient Instructions (Addendum)
Start drinking at least 130 ounces of water a day.  Add more "good" carbs- fruits, veggies, wheat based products to diet for snacks between.    Good carbs equal carbs with low glycemic index.  Increase effexor 225 mg QD Start prozac taper as bottle reads.  Follow up in 3 months   Please help [place] help you:  We are honored you have chosen [facility] for your Primary Care home. Below you will find basic instructions that you may need to access in the future. Please help [place] help you by reading the instructions, which cover many of the frequent questions we experience.   Prescription refills and request:  -In order to allow more efficient response time, please call your pharmacy for all refills. They will forward the request electronically to [place]. This allows for the quickest possible response. Request left on a nurse line can take longer to refill, since these are checked as time allows between office patients and other phone calls.  - refill request can take up to 3-5 working days to complete.  - If request is sent electronically and request is appropiate, it is usually completed in 1-2 business days.  - all patients will need to be seen routinely for all chronic medical conditions requiring prescription medications (see follow-up below). If you are overdue for follow up on your condition, you will be asked to make an appointment and we will call in enough medication to cover you until your appointment (up to 30 days).  - all controlled substances will require a face to face visit to request/refill.  - if you desire your prescriptions to go through a new pharmacy, and have an active script at original pharmacy, you will need to call your pharmacy and have scripts transferred to new pharmacy. This is completed between the pharmacy locations and not by your provider.    Results: If any images or labs were ordered, it can take up to 1 week to get results depending on the test ordered and the  lab/facility running and resulting the test. - Normal or stable results, which do not need further discussion, may be released to your mychart immediately with attached note to you. A call may not be generated for normal results. Please make certain to sign up for mychart. If you have questions on how to activate your mychart you can call the front office.  - If your results need further discussion, our office will attempt to contact you via phone, and if unable to reach you after 2 attempts, we will release your abnormal result to your mychart with instructions.  - All results will be automatically released in mychart after 1 week.  - Your provider will provide you with explanation and instruction on all relevant material in your results. Please keep in mind, results and labs may appear confusing or abnormal to the untrained eye, but it does not mean they are actually abnormal for you personally. If you have any questions about your results that are not covered, or you desire more detailed explanation than what was provided, you should make an appointment with your provider to do so.   Our office handles many outgoing and incoming calls daily. If we have not contacted you within 1 week about your results, please check your mychart to see if there is a message first and if not, then contact our office.  In helping with this matter, you help decrease call volume, and therefore allow [place] to be able  to respond to patients needs more efficiently.   Acute office visits (sick visit):  An acute visit is intended for a new problem and are scheduled in shorter time slots to allow schedule openings for patients with new problems. This is the appropriate visit to discuss a new problem. Problems will not be addressed by phone call or Echart message. Appointment is needed if requesting treatment. In order to provide you with excellent quality medical care with proper time for you to explain your problem, have an exam and  receive treatment with instructions, these appointments should be limited to one new problem per visit. If you experience a new problem, in which you desire to be addressed, please make an acute office visit, we save openings on the schedule to accommodate you. Please do not save your new problem for any other type of visit, let us take care of it properly and quickly for you.   Follow up visits:  Depending on your condition(s) your provider will need to see you routinely in order to provide you with quality care and prescribe medication(s). Most chronic conditions (Example: hypertension, Diabetes, depression/anxiety... etc), require visits a couple times a year. Your provider will instruct you on proper follow up for your personal medical conditions and history. Please make certain to make follow up appointments for your condition as instructed. Failing to do so could result in lapse in your medication treatment/refills. If you request a refill, and are overdue to be seen on a condition, we will always provide you with a 30 day script (once) to allow you time to schedule.    Medicare wellness (well visit): - we have a wonderful Nurse Selena Batten), that will meet with you and provide you will yearly medicare wellness visits. These visits should occur yearly (can not be scheduled less than 1 calendar year apart) and cover preventive health, immunizations, advance directives and screenings you are entitled to yearly through your medicare benefits. Do not miss out on your entitled benefits, this is when medicare will pay for these benefits to be ordered for you.  These are strongly encouraged by your provider and is the appropriate type of visit to make certain you are up to date with all preventive health benefits. If you have not had your medicare wellness exam in the last 12 months, please make certain to schedule one by calling the office and schedule your medicare wellness with Selena Batten as soon as possible.   Yearly  physical (well visit):  - Adults are recommended to be seen yearly for physicals. Check with your insurance and date of your last physical, most insurances require one calendar year between physicals. Physicals include all preventive health topics, screenings, medical exam and labs that are appropriate for gender/age and history. You may have fasting labs needed at this visit. This is a well visit (not a sick visit), new problems should not be covered during this visit (see acute visit).  - Pediatric patients are seen more frequently when they are younger. Your provider will advise you on well child visit timing that is appropriate for your their age. - This is not a medicare wellness visit. Medicare wellness exams do not have an exam portion to the visit. Some medicare companies allow for a physical, some do not allow a yearly physical. If your medicare allows a yearly physical you can schedule the medicare wellness with our nurse Selena Batten and have your physical with your provider after, on the same day. Please check with insurance for  your full benefits.   Late Policy/No Shows:  - all new patients should arrive 15-30 minutes earlier than appointment to allow [place] time  to  obtain all personal demographics,  insurance information and for you to complete office paperwork. - All established patients should arrive 10-15 minutes earlier than appointment time to update all information and be checked in .  - In our best efforts to run on time, if you are late for your appointment you will be asked to either reschedule or if able, we will work you back into the schedule. There will be a wait time to work you back in the schedule,  depending on availability.  - If you are unable to make it to your appointment as scheduled, please call 24 hours ahead of time to allow [place] to fill the time slot with someone else who needs to be seen. If you do not cancel your appointment ahead of time, you may be charged a no show  fee.

## 2018-03-23 NOTE — Progress Notes (Signed)
Nicholas Anthony , 1980/07/30, 38 y.o., male MRN: [id number] Patient Care Team    Relationship Specialty Notifications Start End  Natalia LeatherwoodKuneff, Nadege Carriger A, DO PCP - General Family Medicine  09/12/17     Chief Complaint  Patient presents with  . Headache    4-5 days  . Dizziness  . Fatigue  . Fussy     Subjective:  Patient reports he is doing ok on effexor150 mg QD, increased last visit 01/23/2018 2/2 to reports of irritable, anxious and lack of focus. Today he reports 5 days of headache, dizziness, fatigue and feeling "fussy". No negative side effects to medication.  Reports the dizziness is usually when changing positions such as when squatting.  He also has noticed a frontal headache and needing to squint at night because headlights were hurting his eyes.  He has been dieting cutting back on many carbs, however he states he does eat some carbs per day.  He drinks about 4-5 bottles of water a day.  Denies fever, chills, nausea, vomit, syncope or diarrhea.  He is worried his sugar is dropping. Flu shot UTD. Prior note:  Pt presents for an OV with complaints of difficulty staying focused.  He states he had trouble as a kid but was never formally diagnosed with ADHD or had treatment.  His brothers were diagnosed with ADHD and had treatment.  He states he has gone back to school and is having difficulty staying focused.  He reports that his mind is racing and he has trouble staying on tasks and getting assignments completed.  Has difficulty retaining what he reads because he is thinking of other things as he is reading.  He reports he is unable to "shut off the other parts of his brain "when needing to focus.    [facility] 2[facility] 11/25/2017 09/12/2017  Decreased Interest 0 0  Down, Depressed, Hopeless 2 0  PHQ - 2 Score 2 0  Altered sleeping 0 -  Tired, decreased energy 0 -  Change in appetite 0 -  Feeling bad or failure about yourself  0 -  Trouble concentrating 3 -  Moving slowly or  fidgety/restless 3 -  Suicidal thoughts 0 -  PHQ-9 Score 8 -  Difficult doing work/chores Very difficult -   GAD 7 : Generalized Anxiety Score 01/23/2018 12/23/2017 11/25/2017  Nervous, Anxious, on Edge 3 0 0  Control/stop worrying 0 0 0  Worry too much - different things 0 0 0  Trouble relaxing 1 0 0  Restless 3 2 2   Easily annoyed or irritable 3 1 2   Afraid - awful might happen 0 0 0  Total GAD 7 Score 10 3 4   Anxiety Difficulty Extremely difficult Somewhat difficult Very difficult   Allergies  Allergen Reactions  . Penicillins Other (See Comments)    Unsure has not taken since childhood Told he was allergic as a child Told he was allergic as a child    Social History   Tobacco Use  . Smoking status: Never Smoker  . Smokeless tobacco: Never Used  Substance Use Topics  . Alcohol use: Never    Frequency: Never   Past Medical History:  Diagnosis Date  . Allergy   . Asthma   . Chicken pox   . 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 NO PAST SURGERIES     Family History  Problem Relation Age of Onset  . Diabetes Father   . Heart  disease Father   . Hyperlipidemia Father   . Hypertension Father   . Drug abuse Sister   . Alcohol abuse Sister   . Arthritis Paternal Grandmother   . Depression Paternal Grandmother   . Diabetes Paternal Grandmother   . Hyperlipidemia Paternal Grandmother   . Heart disease Paternal Grandmother   . Hypertension Paternal Grandmother   . Arthritis Paternal Grandfather   . Hearing loss Paternal Grandfather   . Heart disease Paternal Grandfather   . Hyperlipidemia Paternal Grandfather   . Hypertension Paternal Grandfather   . Kidney disease Paternal Grandfather   . Drug abuse Brother    Allergies as of 03/23/2018      Reactions   Penicillins Other (See Comments)   Unsure has not taken since childhood Told he was allergic as a child Told he was allergic as a child      Medication List         Accurate as of March 23, 2018 11:59 PM. Always use your most recent med list.        FLUoxetine 20 MG tablet Commonly known as:  PROZAC 20 mg Qd for 7 days, then 40 mg QD   lisinopril 10 MG tablet Commonly known as:  PRINIVIL,ZESTRIL Take 1 tablet (10 mg total) by mouth daily.   triamcinolone cream 0.1 % Commonly known as:  KENALOG Apply 1 application topically 2 (two) times daily.   venlafaxine XR 75 MG 24 hr capsule Commonly known as:  EFFEXOR XR Take 3 capsules (225 mg total) by mouth daily with breakfast.       All past medical history, surgical history, allergies, family history, immunizations andmedications were updated in the EMR today and reviewed under the history and medication portions of their EMR.     ROS: Negative, with the exception of above mentioned in HPI  Objective:  BP 133/83 (BP Location: Left Arm, Patient Position: Sitting, Cuff Size: Large)   Pulse 69   Temp 98.2 F (36.8 C) (Oral)   Resp 16   Ht 6\' 3"  (1.905 m)   Wt (!) 301 lb (136.5 kg)   SpO2 (!) 16%   BMI 37.62 kg/m  Body mass index is 37.62 kg/m. Gen: Afebrile. No acute distress.  Nontoxic.  Obese, Caucasian male. HENT: AT. [place]. Bilateral TM visualized and normal in appearance. MMM. Bilateral nares without erythema, drainage or swelling. Throat without erythema or exudates.  No cough, no hoarseness. Eyes:Pupils Equal Round Reactive to light, Extraocular movements intact,  Conjunctiva without redness, discharge or icterus. Neck/lymp/endocrine: Supple, no lymphadenopathy, no thyromegaly CV: RRR Chest: CTAB, no wheeze or crackles Skin: No rashes, purpura or petechiae.  Neuro:  Normal gait. PERLA. EOMi. Alert. Oriented x3  Psych: Normal affect, dress and demeanor. Normal speech. Normal thought content and judgment.   Results for orders placed or performed in visit on 03/23/18 (from the past 48 hour(s))  POCT Glucose (CBG)     Status: Normal   Collection Time: 03/23/18  8:57 AM  Result  Value Ref Range   POC Glucose 87 70 - 99 mg/dl    Assessment/Plan: Nicholas Anthony is a 38 y.o. male present for OV for  Dizziness/headache -Multiple  potential causes of his dizziness, irritability and headaches.  He is exercising and watching his diet closely.  He states that he is taking in carbohydrates daily.  Encouraged him to monitor the grams of carbohydrates he is taking in, try to increase his carbohydrates with low glycemic index carbs.  He was  educated on low glycemic index carbs today. -Hydrate.  Encouraged him to take in at least 130 ounces of water a day, more if exercising. -Did not appear infectious today. - POCT Glucose (CBG) -If symptoms are not improved with the above recommendations follow-up in 2-4 weeks otherwise we will follow-up in 3 months  Mild anxiety/Attention deficit -Discussed options with his irritability today.  Increase Effexor to 225 mg daily.  Refills provided for higher dose. Start Prozac 20 mg--tapered to 40 mg. -He does have attention problems when in school, would not want to start a stimulant if he is having irritability and anxiety issues.  He agrees.  Could consider at a later date once anxiety and irritability is more controlled. - venlafaxine XR (EFFEXOR-XR) 75 MG 24 hr capsule; Take 3 capsules (225 mg total) by mouth daily with breakfast.  Dispense: 270 capsule; Refill: 0 -, Up 3 months  Reviewed expectations re: course of current medical issues.  Discussed self-management of symptoms.  Outlined signs and symptoms indicating need for more acute intervention.  Patient verbalized understanding and all questions were answered.  Patient received an After-Visit Summary.   > 25 minutes spent with patient, >50% of time spent face to face counseling     Orders Placed This Encounter  Procedures  . POCT Glucose (CBG)    Note is dictated utilizing voice recognition software. Although note has been proof read prior to signing, occasional  typographical errors still can be missed. If any questions arise, please do not hesitate to call for verification.   electronically signed by:  Felix Pacini, DO  [facility] [place]

## 2018-03-24 ENCOUNTER — Encounter: Payer: Self-pay | Admitting: Family Medicine

## 2018-03-24 DIAGNOSIS — R51 Headache: Secondary | ICD-10-CM

## 2018-03-24 DIAGNOSIS — R519 Headache, unspecified: Secondary | ICD-10-CM | POA: Insufficient documentation

## 2018-03-31 ENCOUNTER — Ambulatory Visit: Payer: Managed Care, Other (non HMO) | Admitting: Family Medicine

## 2018-04-04 ENCOUNTER — Other Ambulatory Visit: Payer: Self-pay

## 2018-04-04 MED ORDER — LISINOPRIL 10 MG PO TABS: 10.0000 mg | ORAL_TABLET | Freq: Every day | ORAL | 1 refills | Status: DC

## 2018-04-04 NOTE — Telephone Encounter (Signed)
Pts pharmacy request refill on pts Lisinopril. Pt was in for OV in 1/20. Will rf #90 w 1 additional rf.

## 2018-04-25 ENCOUNTER — Other Ambulatory Visit: Payer: Self-pay | Admitting: Family Medicine

## 2018-04-25 DIAGNOSIS — F419 Anxiety disorder, unspecified: Secondary | ICD-10-CM

## 2018-04-25 DIAGNOSIS — R41840 Attention and concentration deficit: Secondary | ICD-10-CM

## 2018-05-17 ENCOUNTER — Other Ambulatory Visit: Payer: Self-pay | Admitting: Family Medicine

## 2018-05-17 DIAGNOSIS — R41840 Attention and concentration deficit: Secondary | ICD-10-CM

## 2018-05-17 DIAGNOSIS — F419 Anxiety disorder, unspecified: Secondary | ICD-10-CM

## 2018-05-18 NOTE — Telephone Encounter (Signed)
Called pt and LM about stating we had a RX refill and if he needed refills on his medications to please call the office.

## 2018-05-18 NOTE — Telephone Encounter (Signed)
Please inform patient the following information: I received a request for his effexor. This med was filled his last appt 03/23/2018 for #270 caps- which should have provided with him with 3 months of script.  Please make sure he has not run out of this medication, and will not before his appt 4/2

## 2018-05-26 ENCOUNTER — Encounter: Payer: Self-pay | Admitting: Family Medicine

## 2018-05-26 ENCOUNTER — Ambulatory Visit (INDEPENDENT_AMBULATORY_CARE_PROVIDER_SITE_OTHER): Payer: Managed Care, Other (non HMO) | Admitting: Family Medicine

## 2018-05-26 VITALS — BP 119/78 | HR 76 | Temp 98.0°F | Resp 16 | Ht 75.0 in | Wt 292.5 lb

## 2018-05-26 DIAGNOSIS — I10 Essential (primary) hypertension: Secondary | ICD-10-CM | POA: Diagnosis not present

## 2018-05-26 DIAGNOSIS — F419 Anxiety disorder, unspecified: Secondary | ICD-10-CM | POA: Diagnosis not present

## 2018-05-26 DIAGNOSIS — L84 Corns and callosities: Secondary | ICD-10-CM | POA: Insufficient documentation

## 2018-05-26 DIAGNOSIS — R41840 Attention and concentration deficit: Secondary | ICD-10-CM

## 2018-05-26 MED ORDER — LISINOPRIL 10 MG PO TABS: 10.0000 mg | ORAL_TABLET | Freq: Every day | ORAL | 5 refills | Status: DC

## 2018-05-26 MED ORDER — FLUOXETINE HCL 20 MG PO TABS: ORAL_TABLET | ORAL | 5 refills | Status: DC

## 2018-05-26 MED ORDER — FLUOXETINE HCL 20 MG PO TABS: 40.0000 mg | ORAL_TABLET | Freq: Every day | ORAL | 5 refills | Status: DC

## 2018-05-26 MED ORDER — VENLAFAXINE HCL ER 75 MG PO CP24: 225.0000 mg | ORAL_CAPSULE | Freq: Every day | ORAL | 5 refills | Status: DC

## 2018-05-26 NOTE — Patient Instructions (Addendum)
Glad you are doing great.  Follow up in 6 months for physical (fasting)- if other chronic  Problems doing well with no changes needed--> we will cover those as well. If your are having issues--> follow up sooner for your chronic problems.   Try the corn pads for your foot.    Please help [place] help you:  We are honored you have chosen [facility] for your Primary Care home. Below you will find basic instructions that you may need to access in the future. Please help [place] help you by reading the instructions, which cover many of the frequent questions we experience.   Prescription refills and request:  -In order to allow more efficient response time, please call your pharmacy for all refills. They will forward the request electronically to [place]. This allows for the quickest possible response. Request left on a nurse line can take longer to refill, since these are checked as time allows between office patients and other phone calls.  - refill request can take up to 3-5 working days to complete.  - If request is sent electronically and request is appropiate, it is usually completed in 1-2 business days.  - all patients will need to be seen routinely for all chronic medical conditions requiring prescription medications (see follow-up below). If you are overdue for follow up on your condition, you will be asked to make an appointment and we will call in enough medication to cover you until your appointment (up to 30 days).  - all controlled substances will require a face to face visit to request/refill.  - if you desire your prescriptions to go through a new pharmacy, and have an active script at original pharmacy, you will need to call your pharmacy and have scripts transferred to new pharmacy. This is completed between the pharmacy locations and not by your provider.    Results: If any images or labs were ordered, it can take up to 1 week to get results depending on the test ordered and the  lab/facility running and resulting the test. - Normal or stable results, which do not need further discussion, may be released to your mychart immediately with attached note to you. A call may not be generated for normal results. Please make certain to sign up for mychart. If you have questions on how to activate your mychart you can call the front office.  - If your results need further discussion, our office will attempt to contact you via phone, and if unable to reach you after 2 attempts, we will release your abnormal result to your mychart with instructions.  - All results will be automatically released in mychart after 1 week.  - Your provider will provide you with explanation and instruction on all relevant material in your results. Please keep in mind, results and labs may appear confusing or abnormal to the untrained eye, but it does not mean they are actually abnormal for you personally. If you have any questions about your results that are not covered, or you desire more detailed explanation than what was provided, you should make an appointment with your provider to do so.   Our office handles many outgoing and incoming calls daily. If we have not contacted you within 1 week about your results, please check your mychart to see if there is a message first and if not, then contact our office.  In helping with this matter, you help decrease call volume, and therefore allow [place] to be able to  respond to patients needs more efficiently.   Acute office visits (sick visit):  An acute visit is intended for a new problem and are scheduled in shorter time slots to allow schedule openings for patients with new problems. This is the appropriate visit to discuss a new problem. Problems will not be addressed by phone call or Echart message. Appointment is needed if requesting treatment. In order to provide you with excellent quality medical care with proper time for you to explain your problem, have an exam and  receive treatment with instructions, these appointments should be limited to one new problem per visit. If you experience a new problem, in which you desire to be addressed, please make an acute office visit, we save openings on the schedule to accommodate you. Please do not save your new problem for any other type of visit, let us take care of it properly and quickly for you.   Follow up visits:  Depending on your condition(s) your provider will need to see you routinely in order to provide you with quality care and prescribe medication(s). Most chronic conditions (Example: hypertension, Diabetes, depression/anxiety... etc), require visits a couple times a year. Your provider will instruct you on proper follow up for your personal medical conditions and history. Please make certain to make follow up appointments for your condition as instructed. Failing to do so could result in lapse in your medication treatment/refills. If you request a refill, and are overdue to be seen on a condition, we will always provide you with a 30 day script (once) to allow you time to schedule.    Medicare wellness (well visit): - we have a wonderful Nurse Maudie Mercury), that will meet with you and provide you will yearly medicare wellness visits. These visits should occur yearly (can not be scheduled less than 1 calendar year apart) and cover preventive health, immunizations, advance directives and screenings you are entitled to yearly through your medicare benefits. Do not miss out on your entitled benefits, this is when medicare will pay for these benefits to be ordered for you.  These are strongly encouraged by your provider and is the appropriate type of visit to make certain you are up to date with all preventive health benefits. If you have not had your medicare wellness exam in the last 12 months, please make certain to schedule one by calling the office and schedule your medicare wellness with Maudie Mercury as soon as possible.   Yearly  physical (well visit):  - Adults are recommended to be seen yearly for physicals. Check with your insurance and date of your last physical, most insurances require one calendar year between physicals. Physicals include all preventive health topics, screenings, medical exam and labs that are appropriate for gender/age and history. You may have fasting labs needed at this visit. This is a well visit (not a sick visit), new problems should not be covered during this visit (see acute visit).  - Pediatric patients are seen more frequently when they are younger. Your provider will advise you on well child visit timing that is appropriate for your their age. - This is not a medicare wellness visit. Medicare wellness exams do not have an exam portion to the visit. Some medicare companies allow for a physical, some do not allow a yearly physical. If your medicare allows a yearly physical you can schedule the medicare wellness with our nurse Maudie Mercury and have your physical with your provider after, on the same day. Please check with insurance for your  full benefits.   Late Policy/No Shows:  - all new patients should arrive 15-30 minutes earlier than appointment to allow [place] time  to  obtain all personal demographics,  insurance information and for you to complete office paperwork. - All established patients should arrive 10-15 minutes earlier than appointment time to update all information and be checked in .  - In our best efforts to run on time, if you are late for your appointment you will be asked to either reschedule or if able, we will work you back into the schedule. There will be a wait time to work you back in the schedule,  depending on availability.  - If you are unable to make it to your appointment as scheduled, please call 24 hours ahead of time to allow [place] to fill the time slot with someone else who needs to be seen. If you do not cancel your appointment ahead of time, you may be charged a no show fee.

## 2018-05-26 NOTE — Progress Notes (Signed)
Nicholas Anthony , 09-Apr-1980, 38 y.o., male MRN: [id number] Patient Care Team    Relationship Specialty Notifications Start End  Natalia Leatherwood, DO PCP - General Family Medicine  09/12/17     Chief Complaint  Patient presents with  . Anxiety    Not fasting. Possible side effects of anxiety medications.   . Foot Pain    Left pain x2 months but has gotten worse. Hard place that is sore to touch and painful when walking.   Marland Kitchen Hypertension    Pt takes medicaitons in the morning. Takes BP at home sometimes. States it has been good. He would like 90 day supply of medications if possible.      Subjective:   Hypertension/morbid obesity/ophthalmological abnormality:  Patient reports compliance with lisinopril 10 mg daily.  He has started walking every evening and has cut back on carbohydrates and sugars in his diet.  Patient denies chest pain, shortness of breath, dizziness or lower extremity edema.  He has lost 90.5 pounds since September 12, 2017. Pt does not take a  daily baby ASA. Pt is is not  prescribed statin. BMP: 09/12/2017 within normal limits CBC: 09/12/2017 within normal limits Lipids:09/12/2017 cholesterol 180, HDL 36, LDL 123, triglycerides 101 TSH: 09/12/2017 1.13 Microalbumin: 09/12/2017 within normal limits Urine microalb: ordered today Diet: low sodium Exercise: encouraged routinely.  RF: HTN, obesity, Fhx heart disease  ADD/Anxiety:  She returns today to discuss his ADD and anxiety.  He states he is doing the best he has ever done with the current combination of medications.  He is compliant with Effexor 225 mg daily, which was increased last visit.  He is also compliant with Prozac 40 mg daily, which was tapered and started last visit.  He reports he is focusing well, he is not irritable, he feels great.  The only problem is he is having some sexual side effects since the last visit while we increased his regimen.  He states he can deal with it for now but he is wondering if this  is something that will get better with time.  He states he does have a desire and he is able to have an erection, but he is not able to have an orgasm. Prior note: Patient reports he is doing ok on effexor150 mg QD, increased last visit 01/23/2018 2/2 to reports of irritable, anxious and lack of focus. Today he reports 5 days of headache, dizziness, fatigue and feeling "fussy". No negative side effects to medication.  Reports the dizziness is usually when changing positions such as when squatting.  He also has noticed a frontal headache and needing to squint at night because headlights were hurting his eyes.  He has been dieting cutting back on many carbs, however he states he does eat some carbs per day.  He drinks about 4-5 bottles of water a day.  Denies fever, chills, nausea, vomit, syncope or diarrhea.  He is worried his sugar is dropping. Flu shot UTD. Prior note:  Pt presents for an OV with complaints of difficulty staying focused.  He states he had trouble as a kid but was never formally diagnosed with ADHD or had treatment.  His brothers were diagnosed with ADHD and had treatment.  He states he has gone back to school and is having difficulty staying focused.  He reports that his mind is racing and he has trouble staying on tasks and getting assignments completed.  Has difficulty retaining what he reads because he is thinking  of other things as he is reading.  He reports he is unable to "shut off the other parts of his brain "when needing to focus.    Foot Pain:  Patient reports last 2 weeks he has had left foot pain on the bottom year first and second metatarsal heads.  He states he has had something like this similar in the past.  He does have 2 hard callus-like formations at this location.  He has not tried anything for the discomfort.  He also has a bone spur he is aware of at a different location that is creating foot pain.    Depression screen [facility] 2/9 05/26/2018 11/25/2017 09/12/2017  Decreased  Interest 0 0 0  Down, Depressed, Hopeless 0 2 0  PHQ - 2 Score 0 2 0  Altered sleeping 3 0 -  Tired, decreased energy 0 0 -  Change in appetite 0 0 -  Feeling bad or failure about yourself  0 0 -  Trouble concentrating 0 3 -  Moving slowly or fidgety/restless 0 3 -  Suicidal thoughts 0 0 -  PHQ-9 Score 3 8 -  Difficult doing work/chores Not difficult at all Very difficult -   GAD 7 : Generalized Anxiety Score 05/26/2018 01/23/2018 12/23/2017 11/25/2017  Nervous, Anxious, on Edge 0 3 0 0  Control/stop worrying 0 0 0 0  Worry too much - different things 0 0 0 0  Trouble relaxing 0 1 0 0  Restless 0 3 2 2   Easily annoyed or irritable 0 3 1 2   Afraid - awful might happen 0 0 0 0  Total GAD 7 Score 0 10 3 4   Anxiety Difficulty Not difficult at all Extremely difficult Somewhat difficult Very difficult   Allergies  Allergen Reactions  . Penicillins Other (See Comments)    Unsure has not taken since childhood Told he was allergic as a child Told he was allergic as a child    Social History   Tobacco Use  . Smoking status: Never Smoker  . Smokeless tobacco: Never Used  Substance Use Topics  . Alcohol use: Never    Frequency: Never   Past Medical History:  Diagnosis Date  . Allergy   . Asthma   . Chicken pox   . 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 NO PAST SURGERIES     Family History  Problem Relation Age of Onset  . Diabetes Father   . Heart disease Father   . Hyperlipidemia Father   . Hypertension Father   . Drug abuse Sister   . Alcohol abuse Sister   . Arthritis Paternal Grandmother   . Depression Paternal Grandmother   . Diabetes Paternal Grandmother   . Hyperlipidemia Paternal Grandmother   . Heart disease Paternal Grandmother   . Hypertension Paternal Grandmother   . Arthritis Paternal Grandfather   . Hearing loss Paternal Grandfather   . Heart disease Paternal Grandfather   . Hyperlipidemia Paternal  Grandfather   . Hypertension Paternal Grandfather   . Kidney disease Paternal Grandfather   . Drug abuse Brother    Allergies as of 05/26/2018      Reactions   Penicillins Other (See Comments)   Unsure has not taken since childhood Told he was allergic as a child Told he was allergic as a child      Medication List       Accurate as of May 26, 2018 12:50 PM. Always use your  most recent med list.        FLUoxetine 20 MG tablet Commonly known as:  PROZAC 20 mg Qd for 7 days, then 40 mg QD   lisinopril 10 MG tablet Commonly known as:  PRINIVIL,ZESTRIL Take 1 tablet (10 mg total) by mouth daily.   triamcinolone cream 0.1 % Commonly known as:  KENALOG Apply 1 application topically 2 (two) times daily.   venlafaxine XR 75 MG 24 hr capsule Commonly known as:  EFFEXOR-XR Take 3 capsules (225 mg total) by mouth daily with breakfast.       All past medical history, surgical history, allergies, family history, immunizations andmedications were updated in the EMR today and reviewed under the history and medication portions of their EMR.     ROS: Negative, with the exception of above mentioned in HPI  Objective:  BP 119/78 (BP Location: Right Arm, Patient Position: Sitting, Cuff Size: Normal)   Pulse 76   Temp 98 F (36.7 C) (Oral)   Resp 16   Ht  (1.905 m)   Wt 292 lb 8 oz (132.7 kg)   SpO2 100%   BMI 36.56 kg/m  Body mass index is 36.56 kg/m. Gen: Afebrile. No acute distress.  Nontoxic in presentation, well-developed, well-nourished, obese, Caucasian, male. HENT: AT. [place].MMM.  Eyes:Pupils Equal Round Reactive to light, Extraocular movements intact,  Conjunctiva without redness, discharge or icterus. Neck/lymp/endocrine: Supple, no lymphadenopathy, no thyromegaly CV: RRR no murmur, no edema, +2/4 P posterior tibialis pulses Chest: CTAB, no wheeze or crackles Abd: Soft. NTND. BS present.  MSK: Plantar aspect left foot with x2 small corns located between first and  second metatarsal heads.  No erythema, no drainage. Skin: no rashes, purpura or petechiae.  Neuro: Normal gait. PERLA. EOMi. Alert. Oriented.  Psych: Normal affect, dress and demeanor. Normal speech. Normal thought content and judgment..   No results found for this or any previous visit (from the past 48 hour(s)).  Assessment/Plan: Nicholas Anthony is a 38 y.o. male present for OV for  Mild anxiety/Attention deficit -Stable on current regimen.  Is having some sexual side effects.  Hopefully this will resolve with time.  If not may need to either decrease the Effexor dose or the Prozac dose and see if we can keep him as controlled on lower dose medication.   -Continue Effexor 225 mg daily.   -Continue Prozac 40 mg daily.  -Follow-up 6 months, can be CPE if he is doing well on medications and does not need any changes.  Hypertension/obesity:  -Stable.  Continue lisinopril 10 mg daily.  Refills provided today. -Continue diet and weight loss regimen, routine exercise.  He has lost 90.5 pounds since last June. -Low sodium diet. -Follow-up in 6 months, can be CPE.  Corn:  -Area appears to be a corn.  Encouraged him to use OTC corn treatment.  If does not resolve with treatment can consider podiatry referral.  He is agreeable to this today.   Reviewed expectations re: course of current medical issues.  Discussed self-management of symptoms.  Outlined signs and symptoms indicating need for more acute intervention.  Patient verbalized understanding and all questions were answered.  Patient received an After-Visit Summary.   > 25 minutes spent with patient, >50% of time spent face to face counseling     No orders of the defined types were placed in this encounter.   Note is dictated utilizing voice recognition software. Although note has been proof read prior to signing, occasional typographical errors  still can be missed. If any questions arise, please do not hesitate to call for  verification.   electronically signed by:  Felix Pacini, DO  [facility] - [place]

## 2018-06-19 ENCOUNTER — Ambulatory Visit (INDEPENDENT_AMBULATORY_CARE_PROVIDER_SITE_OTHER): Payer: Managed Care, Other (non HMO) | Admitting: Family Medicine

## 2018-06-19 ENCOUNTER — Encounter: Payer: Self-pay | Admitting: Family Medicine

## 2018-06-19 ENCOUNTER — Other Ambulatory Visit: Payer: Self-pay

## 2018-06-19 DIAGNOSIS — S99922A Unspecified injury of left foot, initial encounter: Secondary | ICD-10-CM

## 2018-06-19 DIAGNOSIS — M79675 Pain in left toe(s): Secondary | ICD-10-CM

## 2018-06-19 NOTE — Progress Notes (Signed)
Virtual Visit via Video Note  I connected with Nicholas Anthony on 06/19/18 at  3:30 PM EDT by a video enabled telemedicine application and verified that I am speaking with the correct person using two identifiers.  Location patient: home Location provider:work office Persons participating in the virtual visit: patient, provider  I discussed the limitations of evaluation and management by telemedicine and the availability of in person appointments. The patient expressed understanding and agreed to proceed.   HPI: Four days ago he jammed L big toe into front of his crocks.  Hurts pretty bad still. Bruised.  It is swollen--looks bigger than the other one.  Pain with palpation of IP joint per Nicholas Anthony.  Hurts quite a bit when he stands and puts wt on it. No hx of injury to that toe in the past. Took ibup once, otherwise dealing with the pain.   He walks on side of foot.    ROS: See pertinent positives and negatives per HPI.  Past Medical History:  Diagnosis Date  . Allergy   . Asthma   . Chicken pox   . 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 NO PAST SURGERIES      Family History  Problem Relation Age of Onset  . Diabetes Father   . Heart disease Father   . Hyperlipidemia Father   . Hypertension Father   . Drug abuse Sister   . Alcohol abuse Sister   . Arthritis Paternal Grandmother   . Depression Paternal Grandmother   . Diabetes Paternal Grandmother   . Hyperlipidemia Paternal Grandmother   . Heart disease Paternal Grandmother   . Hypertension Paternal Grandmother   . Arthritis Paternal Grandfather   . Hearing loss Paternal Grandfather   . Heart disease Paternal Grandfather   . Hyperlipidemia Paternal Grandfather   . Hypertension Paternal Grandfather   . Kidney disease Paternal Grandfather   . Drug abuse Brother       Current Outpatient Medications:  .  cetirizine (ZYRTEC) 10 MG tablet, Take 10 mg by mouth daily., Disp: , Rfl:  .   FLUoxetine (PROZAC) 20 MG tablet, Take 2 tablets (40 mg total) by mouth daily., Disp: 60 tablet, Rfl: 5 .  lisinopril (PRINIVIL,ZESTRIL) 10 MG tablet, Take 1 tablet (10 mg total) by mouth daily., Disp: 30 tablet, Rfl: 5 .  triamcinolone cream (KENALOG) 0.1 %, Apply 1 application topically 2 (two) times daily., Disp: 30 g, Rfl: 0 .  venlafaxine XR (EFFEXOR-XR) 75 MG 24 hr capsule, Take 3 capsules (225 mg total) by mouth daily with breakfast., Disp: 90 capsule, Rfl: 5  EXAM:  VITALS per patient if applicable: There were no vitals taken for this visit.   GENERAL: alert, oriented, appears well and in no acute distress  No further exam today.  ASSESSMENT AND PLAN:  Discussed the following assessment and plan:  Left great toe pain s/p jamming into front of shoe: ongoing pain, swelling. Need to r/o fracture. Post-op shoe dispensed. X-ray ordered for [employer] for tomorrow. Ice x 2-3 times per day, elevate, crutches prn.   Take 600 mg ibup q6h prn with food.    I discussed the assessment and treatment plan with the patient. The patient was provided an opportunity to ask questions and all were answered. The patient agreed with the plan and demonstrated an understanding of the instructions.   The patient was advised to call back or seek an in-person evaluation if the symptoms  worsen or if the condition fails to improve as anticipated.  F/u:  To be determined based on results of pending workup.   Signed:  Santiago Bumpers, MD           06/19/2018

## 2018-06-20 ENCOUNTER — Ambulatory Visit (INDEPENDENT_AMBULATORY_CARE_PROVIDER_SITE_OTHER): Payer: Managed Care, Other (non HMO)

## 2018-06-20 DIAGNOSIS — S99922A Unspecified injury of left foot, initial encounter: Secondary | ICD-10-CM

## 2018-06-20 DIAGNOSIS — M79675 Pain in left toe(s): Secondary | ICD-10-CM

## 2018-06-22 ENCOUNTER — Ambulatory Visit: Payer: Managed Care, Other (non HMO) | Admitting: Family Medicine

## 2018-08-10 ENCOUNTER — Ambulatory Visit (INDEPENDENT_AMBULATORY_CARE_PROVIDER_SITE_OTHER): Payer: Managed Care, Other (non HMO) | Admitting: Family Medicine

## 2018-08-10 ENCOUNTER — Encounter: Payer: Self-pay | Admitting: Family Medicine

## 2018-08-10 VITALS — BP 138/90 | HR 80 | Temp 98.1°F | Resp 17 | Ht 75.0 in | Wt 306.5 lb

## 2018-08-10 DIAGNOSIS — K6289 Other specified diseases of anus and rectum: Secondary | ICD-10-CM | POA: Diagnosis not present

## 2018-08-10 DIAGNOSIS — K640 First degree hemorrhoids: Secondary | ICD-10-CM

## 2018-08-10 LAB — POC HEMOCCULT BLD/STL (OFFICE/1-CARD/DIAGNOSTIC)
Card #1 Date: NEGATIVE
Fecal Occult Blood, POC: NEGATIVE

## 2018-08-10 MED ORDER — HYDROCORTISONE (PERIANAL) 2.5 % EX CREA: TOPICAL_CREAM | Freq: Two times a day (BID) | CUTANEOUS | 1 refills | Status: DC

## 2018-08-10 MED ORDER — DILTIAZEM GEL 2 %: 1.0000 "application " | Freq: Two times a day (BID) | CUTANEOUS | 0 refills | Status: DC

## 2018-08-10 MED ORDER — LIDOCAINE (ANORECTAL) 5 % EX GEL: CUTANEOUS | 1 refills | Status: DC

## 2018-08-10 MED ORDER — HYDROCORTISONE ACETATE 25 MG RE SUPP: 25.0000 mg | Freq: Two times a day (BID) | RECTAL | 0 refills | Status: DC

## 2018-08-10 NOTE — Progress Notes (Signed)
Nicholas Anthony , [birth year], 38 y.o., male MRN: [id number] Patient Care Team    Relationship Specialty Notifications Start End  Natalia LeatherwoodKuneff, [employer] A, DO PCP - General Family Medicine  09/12/17     Chief Complaint  Patient presents with  . Hemorrhoids    x1 month. No blood. No fever. Inside anus L side is painful, more after using the bathroom.      Subjective: Pt presents for an OV with complaints of rectal pain of 1 month duration.  Patient reports he had an ED visit in which he was diagnosed with hemorrhoids provided with a hydrocortisone suppository and cream.  He used the suppositories and symptoms were relieved but not resolved.  For the last few days his symptoms have increased with increased pain.  He reports he feels like it is spasming and stinging.  Is keeping him awake at night and throbbing.  Reports sitting can be uncomfortable but still can standing.  Denies any rectal bleeding.  He states the majority of time his stools are soft formed.  He does not suffer routinely from constipation.  He denies any increased activity or lifting prior to onset.  He has never had hemorrhoids in the past.  Feels that the pain is worse on the left side just inside the rectum.  Pt has tried over-the-counter pain aids and he has continued to use the steroid cream to ease their symptoms.   [facility] 05/26/2018 11/25/2017 09/12/2017  Decreased Interest 0 0 0  Down, Depressed, Hopeless 0 2 0  PHQ - 2 Score 0 2 0  Altered sleeping 3 0 -  Tired, decreased energy 0 0 -  Change in appetite 0 0 -  Feeling bad or failure about yourself  0 0 -  Trouble concentrating 0 3 -  Moving slowly or fidgety/restless 0 3 -  Suicidal thoughts 0 0 -  PHQ-9 Score 3 8 -  Difficult doing work/chores Not difficult at all Very difficult -    Allergies  Allergen Reactions  . Penicillins Other (See Comments)    Unsure has not taken since childhood Told he was allergic as a child Told he was allergic as a  child    Social History   Social History Narrative   Married, employed,    In college. Currently working as a Games developerdiesel Mechanic.    Drinks caffeine.    Smoke alarm in the home. Wears his seatbelt.    Feels safe in his relationship.    Past Medical History:  Diagnosis Date  . Allergy   . Asthma   . Chicken pox   . 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 NO PAST SURGERIES     Family History  Problem Relation Age of Onset  . Diabetes Father   . Heart disease Father   . Hyperlipidemia Father   . Hypertension Father   . Drug abuse Sister   . Alcohol abuse Sister   . Arthritis Paternal Grandmother   . Depression Paternal Grandmother   . Diabetes Paternal Grandmother   . Hyperlipidemia Paternal Grandmother   . Heart disease Paternal Grandmother   . Hypertension Paternal Grandmother   . Arthritis Paternal Grandfather   . Hearing loss Paternal Grandfather   . Heart disease Paternal Grandfather   . Hyperlipidemia Paternal Grandfather   . Hypertension Paternal Grandfather   . Kidney disease Paternal Grandfather   . Drug abuse Brother    Allergies as  of 08/10/2018      Reactions   Penicillins Other (See Comments)   Unsure has not taken since childhood Told he was allergic as a child Told he was allergic as a child      Medication List       Accurate as of Aug 10, 2018 10:23 AM. If you have any questions, ask your nurse or doctor.        cetirizine 10 MG tablet Commonly known as:  ZYRTEC Take 10 mg by mouth daily.   diltiazem 2 % Gel Apply 1 application topically 2 (two) times daily. Started by:  Felix Pacini, DO   FLUoxetine 20 MG tablet Commonly known as:  PROZAC Take 2 tablets (40 mg total) by mouth daily.   hydrocortisone 2.5 % rectal cream Commonly known as:  ANUSOL-HC Apply topically 2 (two) times daily. What changed:    how to take this  when to take this Changed by:  Felix Pacini, DO    hydrocortisone 25 MG suppository Commonly known as:  ANUSOL-HC Place 1 suppository (25 mg total) rectally 2 (two) times daily. Started by:  Felix Pacini, DO   Lidocaine (Anorectal) 5 % Gel Apply pea size amount to rectal area up to 4 times a day PRN for pain. Started by:  Felix Pacini, DO   lisinopril 10 MG tablet Commonly known as:  ZESTRIL Take 1 tablet (10 mg total) by mouth daily.   triamcinolone cream 0.1 % Commonly known as:  KENALOG Apply 1 application topically 2 (two) times daily.   venlafaxine XR 75 MG 24 hr capsule Commonly known as:  EFFEXOR-XR Take 3 capsules (225 mg total) by mouth daily with breakfast.       All past medical history, surgical history, allergies, family history, immunizations andmedications were updated in the EMR today and reviewed under the history and medication portions of their EMR.     ROS: Negative, with the exception of above mentioned in HPI   Objective:  BP 138/90 (BP Location: Left Arm, Patient Position: Sitting, Cuff Size: Normal)   Pulse 80   Temp 98.1 F (36.7 C) (Temporal)   Resp 17   Ht  (1.905 m)   Wt (!) 306 lb 8 oz (139 kg)   SpO2 98%   BMI 38.31 kg/m  Body mass index is 38.31 kg/m. Gen: Afebrile. No acute distress. Nontoxic in appearance, well developed, well nourished.  HENT: AT. [place].  Moist mucous membranes.   Eyes:Pupils Equal Round Reactive to light, Extraocular movements intact,  Conjunctiva without redness, discharge or icterus. Abd: Soft. NTND. BS present.  Rectal exam: tenderness noted 9 o'clock position with rectal exam, internal hemorrhoids noted 9 o'clock position, sphincter tone normal, stool guaiac negative.  No exam data present No results found. Results for orders placed or performed in visit on 08/10/18 (from the past 24 hour(s))  POC Hemoccult Bld/Stl (1-Cd Office Dx)     Status: Normal   Collection Time: 08/10/18  9:36 AM  Result Value Ref Range   Card #1 Date Negative    Fecal Occult  Blood, POC Negative Negative    Assessment/Plan: Nicholas Anthony is a 38 y.o. male present for OV for  Grade I hemorrhoids/Rectal pain - afebrile. TTP over internal hemorrhoid at 9 o'clock position. -Described hydrocortisone suppositories, cortisone cream, lidocaine and a rectal gel and diltiazem gel. -Referred to anorectal specialist to evaluate further and consider procedure if felt necessary secondary to his hemorrhoids returning quickly within a few weeks after  initial treatment. -She was encouraged to increase the fiber in his diet.  Increase water consumption.  Keep stools soft but formed may use MiraLAX if needed and titrate to keep stools soft but formed.  Avoid heavy lifting if possible. -She was encouraged to monitor for any signs of infection or bleeding.  Report immediately if recurs. - Ambulatory referral to General Surgery - POC Hemoccult Bld/Stl (1-Cd Office Dx)>negative -Follow-up PRN   Reviewed expectations re: course of current medical issues.  Discussed self-management of symptoms.  Outlined signs and symptoms indicating need for more acute intervention.  Patient verbalized understanding and all questions were answered.  Patient received an After-Visit Summary.    Orders Placed This Encounter  Procedures  . Ambulatory referral to General Surgery  . POC Hemoccult Bld/Stl (1-Cd Office Dx)    > 25 minutes spent with patient, >50% of time spent face to face counseling     Note is dictated utilizing voice recognition software. Although note has been proof read prior to signing, occasional typographical errors still can be missed. If any questions arise, please do not hesitate to call for verification.   electronically signed by:  Felix Pacini, DO  [facility] - [place]

## 2018-08-10 NOTE — Patient Instructions (Signed)
Start suppositories again. I have called in new ones for and additional cream.  Keep stools soft but formed- you can taper miralax as we discussed to help with this.   Higher fiber content diet can help- as well increasing water consumption.   Avoid heavy lifting- use your legs to help avoid abdominal/rectal pressure with lifting.   Monitor for fever or redness- this would indicate need to be seen.  I have referred you to a specialist to evaluate and offer options since this returned so quickly for you.    Hemorrhoids Hemorrhoids are swollen veins in and around the rectum or anus. There are two types of hemorrhoids:  Internal hemorrhoids. These occur in the veins that are just inside the rectum. They may poke through to the outside and become irritated and painful.  External hemorrhoids. These occur in the veins that are outside the anus and can be felt as a painful swelling or hard lump near the anus. Most hemorrhoids do not cause serious problems, and they can be managed with home treatments such as diet and lifestyle changes. If home treatments do not help the symptoms, procedures can be done to shrink or remove the hemorrhoids. What are the causes? This condition is caused by increased pressure in the anal area. This pressure may result from various things, including:  Constipation.  Straining to have a bowel movement.  Diarrhea.  Pregnancy.  Obesity.  Sitting for long periods of time.  Heavy lifting or other activity that causes you to strain.  Anal sex.  Riding a bike for a long period of time. What are the signs or symptoms? Symptoms of this condition include:  Pain.  Anal itching or irritation.  Rectal bleeding.  Leakage of stool (feces).  Anal swelling.  One or more lumps around the anus. How is this diagnosed? This condition can often be diagnosed through a visual exam. Other exams or tests may also be done, such as:  An exam that involves feeling  the rectal area with a gloved hand (digital rectal exam).  An exam of the anal canal that is done using a small tube (anoscope).  A blood test, if you have lost a significant amount of blood.  A test to look inside the colon using a flexible tube with a camera on the end (sigmoidoscopy or colonoscopy). How is this treated? This condition can usually be treated at home. However, various procedures may be done if dietary changes, lifestyle changes, and other home treatments do not help your symptoms. These procedures can help make the hemorrhoids smaller or remove them completely. Some of these procedures involve surgery, and others do not. Common procedures include:  Rubber band ligation. Rubber bands are placed at the base of the hemorrhoids to cut off their blood supply.  Sclerotherapy. Medicine is injected into the hemorrhoids to shrink them.  Infrared coagulation. A type of light energy is used to get rid of the hemorrhoids.  Hemorrhoidectomy surgery. The hemorrhoids are surgically removed, and the veins that supply them are tied off.  Stapled hemorrhoidopexy surgery. The surgeon staples the base of the hemorrhoid to the rectal wall. Follow these instructions at home: Eating and drinking   Eat foods that have a lot of fiber in them, such as whole grains, beans, nuts, fruits, and vegetables.  Ask your health care provider about taking products that have added fiber (fiber supplements).  Reduce the amount of fat in your diet. You can do this by eating low-fat dairy products,  eating less red meat, and avoiding processed foods.  Drink enough fluid to keep your urine pale yellow. Managing pain and swelling   Take warm sitz baths for 20 minutes, 3-4 times a day to ease pain and discomfort. You may do this in a bathtub or using a portable sitz bath that fits over the toilet.  If directed, apply ice to the affected area. Using ice packs between sitz baths may be helpful. ? Put ice in a  plastic bag. ? Place a towel between your skin and the bag. ? Leave the ice on for 20 minutes, 2-3 times a day. General instructions  Take over-the-counter and prescription medicines only as told by your health care provider.  Use medicated creams or suppositories as told.  Get regular exercise. Ask your health care provider how much and what kind of exercise is best for you. In general, you should do moderate exercise for at least 30 minutes on most days of the week (150 minutes each week). This can include activities such as walking, biking, or yoga.  Go to the bathroom when you have the urge to have a bowel movement. Do not wait.  Avoid straining to have bowel movements.  Keep the anal area dry and clean. Use wet toilet paper or moist towelettes after a bowel movement.  Do not sit on the toilet for long periods of time. This increases blood pooling and pain.  Keep all follow-up visits as told by your health care provider. This is important. Contact a health care provider if you have:  Increasing pain and swelling that are not controlled by treatment or medicine.  Difficulty having a bowel movement, or you are unable to have a bowel movement.  Pain or inflammation outside the area of the hemorrhoids. Get help right away if you have:  Uncontrolled bleeding from your rectum. Summary  Hemorrhoids are swollen veins in and around the rectum or anus.  Most hemorrhoids can be managed with home treatments such as diet and lifestyle changes.  Taking warm sitz baths can help ease pain and discomfort.  In severe cases, procedures or surgery can be done to shrink or remove the hemorrhoids. This information is not intended to replace advice given to you by your health care provider. Make sure you discuss any questions you have with your health care provider. Document Released: 03/05/2000 Document Revised: 07/28/2017 Document Reviewed: 07/28/2017 Elsevier Interactive Patient Education   2019 [employer].

## 2018-09-10 ENCOUNTER — Encounter: Payer: Self-pay | Admitting: Family Medicine

## 2018-09-11 NOTE — Telephone Encounter (Signed)
Pt was called and has appt at 9:30am tomorrow with surgeon. He is feeling better this morning than he was last night. Pt is using all medications and sitz baths as RXed. Pt is only seeing blood when he wipes. Pt is not constipated and is having formed, soft stools. Denies fever. Pt was advised to call surgeon to see if they could move appt to today and if he began to see blood that was more than when wiping he needed to go to ED. Pt was offered another appt with another Chili MD but refused. Pt will call back with any concerns or questions

## 2018-09-12 ENCOUNTER — Encounter: Payer: Self-pay | Admitting: Surgery

## 2018-09-12 DIAGNOSIS — K602 Anal fissure, unspecified: Secondary | ICD-10-CM | POA: Insufficient documentation

## 2018-09-18 ENCOUNTER — Ambulatory Visit: Payer: Self-pay | Admitting: Surgery

## 2018-09-18 NOTE — H&P (Signed)
Nicholas Anthony Documented: 09/12/2018 9:22 AM Location: [facility] Patient #: [id number] DOB: Jul 01, 1980 Married / Language: [place] / Race: White Male   History ofRolan Anthony Present Illness Nicholas Anthony(Nicholas Anthony C. Nicholas Lebeck MD; 09/12/2018 2:30 PM) The patient is a 38 year old male who presents with anal pain. Note for "Anal pain": ` ` ` Patient sent for surgical consultation at the request of Dr Nicholas DikeKunoff  Chief Complaint: Anal pain. Probable hemorrhoids ` ` The patient is a pleasant male that has had some anal pain more recently. He does moderate lifting in his work for [employer]. He is an obese male. He adjusted to a daily and has intentionally lost about 90 pounds. He does not see any radical change in his bowel movements. Usually moves his bowels most days. However he had an episode of sharp pain. Occasional bleeding. Started several weeks ago. However he notes he gets very sharp pain with bowel movements. Not much bleeding. Just when he wipes. No history of prior anorectal problems no history of gastroenterologist. Does confess that sometimes it's hard to strain to get his bowels going. He saw his primary care physician. Resumption of hemorrhoids. Recommended placement of fiber supplement. His be using MiraLAX and that seems his bowels move a little more easily with less straining. However still occasionally hard. He was prescribed suppositories. He didn't feel like that helped much. Tried some topical medications with some mild relief.  No personal nor family history of GI/colon cancer, inflammatory bowel disease, irritable bowel syndrome, allergy such as Celiac Sprue, dietary/dairy problems, colitis, ulcers nor gastritis. No recent sick contacts/gastroenteritis. No travel outside the country. No changes in diet. No dysphagia to solids or liquids. No significant heartburn or reflux. No hematochezia, hematemesis, coffee ground emesis. No evidence of prior gastric/peptic  ulceration.  (Review of systems as stated in this history (HPI) or in the review of systems. Otherwise all other 12 point ROS are negative) ` ` `   Past Surgical History (Nicholas A. Manson PasseyBrown, RMA; 09/12/2018 9:23 AM) No pertinent past surgical history   Diagnostic Studies History (Nicholas A. Manson PasseyBrown, RMA; 09/12/2018 9:23 AM) Colonoscopy  never  Allergies (Nicholas A. Manson PasseyBrown, RMA; 09/12/2018 9:24 AM) Penicillins  Allergies Reconciled   Medication History (Nicholas A. Manson PasseyBrown, RMA; 09/12/2018 9:24 AM) FLUoxetine HCl (20MG  Capsule, Oral) Active. Lisinopril (10MG  Tablet, Oral) Active. Venlafaxine HCl ER (225MG  Tablet ER 24HR, Oral) Active. Proctozone-HC (2.5% Cream, External) Active. Medications Reconciled  Social History (Nicholas A. Manson PasseyBrown, RMA; 09/12/2018 9:23 AM) Caffeine use  Coffee. No alcohol use  No drug use  Tobacco use  Never smoker.  Family History (Nicholas A. Manson PasseyBrown, RMA; 09/12/2018 9:23 AM) Diabetes Mellitus  Family Members In General, Father. Heart Disease  Father. Hypertension  Father.  Other Problems (Nicholas A. Manson PasseyBrown, RMA; 09/12/2018 9:23 AM) Anxiety Disorder  Hemorrhoids  High blood pressure     Review of Systems (Nicholas Anthony RMA; 09/12/2018 9:23 AM) General Not Present- Appetite Loss, Chills, Fatigue, Fever, Night Sweats, Weight Gain and Weight Loss. Skin Present- Dryness and Rash. Not Present- Change in Wart/Mole, Hives, Jaundice, New Lesions, Non-Healing Wounds and Ulcer. HEENT Present- Seasonal Allergies and Wears glasses/contact lenses. Not Present- Earache, Hearing Loss, Hoarseness, Nose Bleed, Oral Ulcers, Ringing in the Ears, Sinus Pain, Sore Throat, Visual Disturbances and Yellow Eyes. Respiratory Not Present- Bloody sputum, Chronic Cough, Difficulty Breathing, Snoring and Wheezing. Breast Not Present- Breast Mass, Breast Pain, Nipple Discharge and Skin Changes. Cardiovascular Not Present- Chest Pain, Difficulty Breathing Lying Down, Leg  Cramps,  Palpitations, Rapid Heart Rate, Shortness of Breath and Swelling of Extremities. Gastrointestinal Present- Bloody Stool, Hemorrhoids, Nausea and Rectal Pain. Not Present- Abdominal Pain, Bloating, Change in Bowel Habits, Chronic diarrhea, Constipation, Difficulty Swallowing, Excessive gas, Gets full quickly at meals, Indigestion and Vomiting. Male Genitourinary Not Present- Blood in Urine, Change in Urinary Stream, Frequency, Impotence, Nocturia, Painful Urination, Urgency and Urine Leakage. Musculoskeletal Not Present- Back Pain, Joint Pain, Joint Stiffness, Muscle Pain, Muscle Weakness and Swelling of Extremities. Neurological Present- Headaches. Not Present- Decreased Memory, Fainting, Numbness, Seizures, Tingling, Tremor, Trouble walking and Weakness. Psychiatric Present- Anxiety. Not Present- Bipolar, Change in Sleep Pattern, Depression, Fearful and Frequent crying. Endocrine Not Present- Cold Intolerance, Excessive Hunger, Hair Changes, Heat Intolerance, Hot flashes and New Diabetes. Hematology Not Present- Blood Thinners, Easy Bruising, Excessive bleeding, Gland problems, HIV and Persistent Infections.  Vitals (Nicholas Anthony RMA; 09/12/2018 9:23 AM) 09/12/2018 9:23 AM Weight: 302.6 lb Height: 75in Body Surface Area: 2.62 m Body Mass Index: 37.82 kg/m  Temp.: 98.58F  Pulse: 101 (Regular)  BP: 132/88(Sitting, Left Arm, Standard)       Physical Exam Nicholas Hector MD; 09/12/2018 1:59 PM) General Mental Status-Alert. General Appearance-Not in acute distress, Not Sickly. Orientation-Oriented X3. Hydration-Well hydrated. Voice-Normal.  Integumentary Global Assessment Upon inspection and palpation of skin surfaces of the - Axillae: non-tender, no inflammation or ulceration, no drainage. and Distribution of scalp and body hair is normal. General Characteristics Temperature - normal warmth is noted.  Head and Neck Head-normocephalic, atraumatic  with no lesions or palpable masses. Face Global Assessment - atraumatic, no absence of expression. Neck Global Assessment - no abnormal movements, no bruit auscultated on the right, no bruit auscultated on the left, no decreased range of motion, non-tender. Trachea-midline. Thyroid Gland Characteristics - non-tender.  Eye Eyeball - Left-Extraocular movements intact, No Nystagmus - Left. Eyeball - Right-Extraocular movements intact, No Nystagmus - Right. Cornea - Left-No Hazy - Left. Cornea - Right-No Hazy - Right. Sclera/Conjunctiva - Left-No scleral icterus, No Discharge - Left. Sclera/Conjunctiva - Right-No scleral icterus, No Discharge - Right. Pupil - Left-Direct reaction to light normal. Pupil - Right-Direct reaction to light normal.  ENMT Ears Pinna - Left - no drainage observed, no generalized tenderness observed. Pinna - Right - no drainage observed, no generalized tenderness observed. Nose and Sinuses External Inspection of the Nose - no destructive lesion observed. Inspection of the nares - Left - quiet respiration. Inspection of the nares - Right - quiet respiration. Mouth and Throat Lips - Upper Lip - no fissures observed, no pallor noted. Lower Lip - no fissures observed, no pallor noted. Nasopharynx - no discharge present. Oral Cavity/Oropharynx - Tongue - no dryness observed. Oral Mucosa - no cyanosis observed. Hypopharynx - no evidence of airway distress observed.  Chest and Lung Exam Inspection Movements - Normal and Symmetrical. Accessory muscles - No use of accessory muscles in breathing. Palpation Palpation of the chest reveals - Non-tender. Auscultation Breath sounds - Normal and Clear.  Cardiovascular Auscultation Rhythm - Regular. Murmurs & Other Heart Sounds - Auscultation of the heart reveals - No Murmurs and No Systolic Clicks.  Abdomen Inspection Inspection of the abdomen reveals - No Visible peristalsis and No Abnormal pulsations.  Umbilicus - No Bleeding, No Urine drainage. Palpation/Percussion Palpation and Percussion of the abdomen reveal - Soft, Non Tender, No Rebound tenderness, No Rigidity (guarding) and No Cutaneous hyperesthesia. Note: Abdomen soft. Nontender. Not distended. No umbilical or incisional hernias. No guarding.   Male Genitourinary Sexual Maturity  Tanner 5 - Adult hair pattern and Adult penile size and shape. Note: No inguinal hernias. Normal external genitalia. Epididymi, testes, and spermatic cords normal without any masses.   Rectal Note: Posterior anal midline fissure with very tiny sentinel tag. Increased sphincter tone. No external hemorrhoids or condylomas. Mild moisture in leaking and mild pleuritis. On any digital or anoscopic exam.   Peripheral Vascular Upper Extremity Inspection - Left - No Cyanotic nailbeds - Left, Not Ischemic. Inspection - Right - No Cyanotic nailbeds - Right, Not Ischemic.  Neurologic Neurologic evaluation reveals -normal attention span and ability to concentrate, able to name objects and repeat phrases. Appropriate fund of knowledge , normal sensation and normal coordination. Mental Status Affect - not angry, not paranoid. Cranial Nerves-Normal Bilaterally. Gait-Normal.  Neuropsychiatric Mental status exam performed with findings of-able to articulate well with normal speech/language, rate, volume and coherence, thought content normal with ability to perform basic computations and apply abstract reasoning and no evidence of hallucinations, delusions, obsessions or homicidal/suicidal ideation.  Musculoskeletal Global Assessment Spine, Ribs and Pelvis - no instability, subluxation or laxity. Right Upper Extremity - no instability, subluxation or laxity.  Lymphatic Head & Neck  General Head & Neck Lymphatics: Bilateral - Description - No Localized lymphadenopathy. Axillary  General Axillary Region: Bilateral - Description - No Localized  lymphadenopathy. Femoral & Inguinal  Generalized Femoral & Inguinal Lymphatics: Left - Description - No Localized lymphadenopathy. Right - Description - No Localized lymphadenopathy.    Assessment & Plan Nicholas Anthony(Kathelyn Gombos C. 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  I recommended that adjust his fiber bowel regimen until he is moving his bowels 1 or 2 times a day.  I recommended a trial of diltiazem cream 4 times a day. Call back in 2 weeks to let us know how things are going. IF he has not seen improvement in the next 2 weeks, schedule surgery for examination under anesthesia and partial internal sphincterotomy. Because the data notes that most fissures can be healed nonoperatively with a muscle relaxant diltiazem cream, try that first. He agrees with the plan  Current Plans Pt Education - [facility] Anal Fissure (Nicholas Anthony) Started DILTIAZEM GEL, 2% (External Gel), 1 (one) application four times daily, 15 Gram, 09/12/2018, Ref. x3. Local Order: Pharmacist Notes: Apply on anus for 3-6 weeks to allow fissure to heal The patient was instructed to call back in 2 weeks with progress  CHRONIC CONSTIPATION (K59.09) Impression: I noted that the fissure is most likely a side effect from hard stools. I agree with his primary care physician and a fiber bowel regimen to soften and normalize his stools and avoid straining to break the cycle of fissures and other issues  Current Plans Pt Education - [facility] Constipation (AT) Pt Education - [facility] Good Bowel Health (Nicholas Anthony)   Signed by Nicholas SportsmanSteven C Andra Matsuo, MD (09/12/2018 2:30 PM)   Addendum - He already had been using diltiazem Rx from PCP w/o help - wants surgery.  Will schedule  Nicholas SportsmanSteven C. Tiannah Greenly, MD, FACS, MASCRS Gastrointestinal and Minimally Invasive Surgery    [address] [place], [place] [id number] [phone] Main / Paging [phone] Fax

## 2018-11-30 ENCOUNTER — Other Ambulatory Visit: Payer: Self-pay | Admitting: Family Medicine

## 2018-11-30 DIAGNOSIS — F419 Anxiety disorder, unspecified: Secondary | ICD-10-CM

## 2018-11-30 DIAGNOSIS — R41840 Attention and concentration deficit: Secondary | ICD-10-CM

## 2018-11-30 NOTE — Telephone Encounter (Signed)
Patient called requesting enough Effexor to get him to his 12/14/18 appt.

## 2018-11-30 NOTE — Telephone Encounter (Signed)
RF request for Effexor  LOV: 05/26/2018 Next ov: 12/14/2018 Last written: 05/26/2018 #60 x5 refills   Pt needs medication to last until scheduled OV. Please advise.

## 2018-12-01 MED ORDER — FLUOXETINE HCL 20 MG PO TABS: 40.0000 mg | ORAL_TABLET | Freq: Every day | ORAL | 0 refills | Status: DC

## 2018-12-04 ENCOUNTER — Telehealth: Payer: Self-pay | Admitting: Family Medicine

## 2018-12-04 DIAGNOSIS — R41840 Attention and concentration deficit: Secondary | ICD-10-CM

## 2018-12-04 DIAGNOSIS — F419 Anxiety disorder, unspecified: Secondary | ICD-10-CM

## 2018-12-04 MED ORDER — VENLAFAXINE HCL ER 75 MG PO CP24: 225.0000 mg | ORAL_CAPSULE | Freq: Every day | ORAL | 0 refills | Status: DC

## 2018-12-04 NOTE — Telephone Encounter (Signed)
PATIENT STATES THAT HE CALLED ON FRIDAY FOR REFILL venlafaxine XR (EFFEXOR-XR) 75 MG 24 hr capsule [[id number]]   PHARMACY STATING RX IS NOT THERE. PATIENT IS OUT OF MEDS  [facility] - [place], [place] #:  --

## 2018-12-04 NOTE — Telephone Encounter (Signed)
Pharmacy was called and they received the Prozac but not the Effexor refill sent for 30 day supply to last until patients appt. PT was notified.

## 2018-12-08 ENCOUNTER — Encounter: Payer: Managed Care, Other (non HMO) | Admitting: Family Medicine

## 2018-12-14 ENCOUNTER — Ambulatory Visit (INDEPENDENT_AMBULATORY_CARE_PROVIDER_SITE_OTHER): Payer: Managed Care, Other (non HMO) | Admitting: Family Medicine

## 2018-12-14 ENCOUNTER — Encounter: Payer: Self-pay | Admitting: Family Medicine

## 2018-12-14 ENCOUNTER — Other Ambulatory Visit: Payer: Self-pay

## 2018-12-14 VITALS — BP 121/81 | HR 66 | Temp 97.6°F | Resp 16 | Ht 75.0 in | Wt 316.0 lb

## 2018-12-14 DIAGNOSIS — R7303 Prediabetes: Secondary | ICD-10-CM | POA: Diagnosis not present

## 2018-12-14 DIAGNOSIS — W57XXXA Bitten or stung by nonvenomous insect and other nonvenomous arthropods, initial encounter: Secondary | ICD-10-CM | POA: Diagnosis not present

## 2018-12-14 DIAGNOSIS — R41840 Attention and concentration deficit: Secondary | ICD-10-CM

## 2018-12-14 DIAGNOSIS — I10 Essential (primary) hypertension: Secondary | ICD-10-CM

## 2018-12-14 DIAGNOSIS — Z23 Encounter for immunization: Secondary | ICD-10-CM

## 2018-12-14 DIAGNOSIS — R42 Dizziness and giddiness: Secondary | ICD-10-CM | POA: Insufficient documentation

## 2018-12-14 DIAGNOSIS — Z0000 Encounter for general adult medical examination without abnormal findings: Secondary | ICD-10-CM | POA: Diagnosis not present

## 2018-12-14 DIAGNOSIS — F419 Anxiety disorder, unspecified: Secondary | ICD-10-CM | POA: Diagnosis not present

## 2018-12-14 HISTORY — DX: Bitten or stung by nonvenomous insect and other nonvenomous arthropods, initial encounter: W57.XXXA

## 2018-12-14 LAB — COMPREHENSIVE METABOLIC PANEL
ALT: 38 U/L (ref 0–53)
AST: 32 U/L (ref 0–37)
Albumin: 4.6 g/dL (ref 3.5–5.2)
Alkaline Phosphatase: 55 U/L (ref 39–117)
BUN: 17 mg/dL (ref 6–23)
CO2: 26 mEq/L (ref 19–32)
Calcium: 9.4 mg/dL (ref 8.4–10.5)
Chloride: 103 mEq/L (ref 96–112)
Creatinine, Ser: 0.92 mg/dL (ref 0.40–1.50)
GFR: 92.31 mL/min (ref >60.00)
Glucose, Bld: 77 mg/dL (ref 70–99)
Potassium: 4.8 mEq/L (ref 3.5–5.1)
Sodium: 137 mEq/L (ref 135–145)
Total Bilirubin: 0.6 mg/dL (ref 0.2–1.2)
Total Protein: 7.2 g/dL (ref 6.0–8.3)

## 2018-12-14 LAB — CBC
HCT: 45.4 % (ref 39.0–52.0)
Hemoglobin: 14.9 g/dL (ref 13.0–17.0)
MCHC: 32.8 g/dL (ref 30.0–36.0)
MCV: 86.1 fl (ref 78.0–100.0)
Platelets: 274 10*3/uL (ref 150.0–400.0)
RBC: 5.28 Mil/uL (ref 4.22–5.81)
RDW: 13.3 % (ref 11.5–15.5)
WBC: 7.4 10*3/uL (ref 4.0–10.5)

## 2018-12-14 LAB — LIPID PANEL
Cholesterol: 183 mg/dL (ref 0–200)
HDL: 38.8 mg/dL — ABNORMAL LOW (ref >39.00)
LDL Cholesterol: 127 mg/dL — ABNORMAL HIGH (ref 0–99)
NonHDL: 144.62
Total CHOL/HDL Ratio: 5
Triglycerides: 87 mg/dL (ref 0.0–149.0)
VLDL: 17.4 mg/dL (ref 0.0–40.0)

## 2018-12-14 LAB — HEMOGLOBIN A1C: Hgb A1c MFr Bld: 5.6 % (ref 4.6–6.5)

## 2018-12-14 LAB — TSH: TSH: 1.15 u[IU]/mL (ref 0.35–4.50)

## 2018-12-14 MED ORDER — LISINOPRIL 10 MG PO TABS: 10.0000 mg | ORAL_TABLET | Freq: Every day | ORAL | 5 refills | Status: DC

## 2018-12-14 MED ORDER — FLUOXETINE HCL 20 MG PO TABS: 40.0000 mg | ORAL_TABLET | Freq: Every day | ORAL | 5 refills | Status: DC

## 2018-12-14 MED ORDER — TRIAMCINOLONE ACETONIDE 0.1 % EX CREA: 1.0000 "application " | TOPICAL_CREAM | Freq: Two times a day (BID) | CUTANEOUS | 2 refills | Status: DC

## 2018-12-14 MED ORDER — VENLAFAXINE HCL ER 75 MG PO CP24: 225.0000 mg | ORAL_CAPSULE | Freq: Every day | ORAL | 5 refills | Status: DC

## 2018-12-14 NOTE — Progress Notes (Signed)
Patient ID: Nicholas Anthony, male  DOB: [birth year], 38 y.o.   MRN: [id number] Patient Care Team    Relationship Specialty Notifications Start End  Natalia LeatherwoodKuneff, Renee A, DO PCP - General Family Medicine  09/12/17     Chief Complaint  Patient presents with  . Anxiety    Pt is doing well with no complaints. Pt had surgery for anal fissure.   . Hypertension  . ADD    Subjective:  Nicholas Anthony is a 38 y.o.  Male  present for CPE. All past medical history, surgical history, allergies, family history, immunizations, medications and social history were updated in the electronic medical record today. All recent labs, ED visits and hospitalizations within the last year were reviewed.  Hypertension/morbid obesity/ophthalmological abnormality:  Patient reports compliance with lisinopril 10 mg daily. Patient denies chest pain, shortness of breath, dizziness or lower extremity edema.    He has lost 90.5 pounds since September 12, 2017. Pt does not take a daily baby ASA. Pt is is not prescribed statin. BMP:09/12/2017 within normal limits CBC:09/12/2017 within normal limits Lipids:09/12/2017 cholesterol 180, HDL 36, LDL 123, triglycerides 101 TSH:09/12/2017 1.13 Microalbumin: 09/12/2017 within normal limits Urine microalb: ordered today Diet: low sodium Exercise: encouraged routinely.  RF: HTN, obesity, Fhx heart disease  ADD/Anxiety:  Patient reports he is doing rather well on the fluoxetine and Effexor combination.  He reports he has better attention and focus, he is no longer irritable and he feels good.  He initially was having some sexual side effects that have resolved.   Health maintenance:  Colonoscopy: No fhx. Routine screen. Immunizations: tdap UTD 2016, Influenza provided today (encouraged yearly) Infectious disease screening: HIV declined. PSA: no fhx. Routine screen. Assistive device: none Oxygen ION:GEXBuse:none Patient has a Dental home. Hospitalizations/ED visits: reviewed   [facility] 12/14/2018 05/26/2018 11/25/2017 09/12/2017  Decreased Interest 0 0 0 0  Down, Depressed, Hopeless 0 0 2 0  PHQ - 2 Score 0 0 2 0  Altered sleeping 3 3 0 -  Tired, decreased energy 0 0 0 -  Change in appetite 0 0 0 -  Feeling bad or failure about yourself  0 0 0 -  Trouble concentrating 1 0 3 -  Moving slowly or fidgety/restless 0 0 3 -  Suicidal thoughts 0 0 0 -  PHQ-9 Score 4 3 8  -  Difficult doing work/chores Not difficult at all Not difficult at all Very difficult -   GAD 7 : Generalized Anxiety Score 12/14/2018 05/26/2018 01/23/2018 12/23/2017  Nervous, Anxious, on Edge 0 0 3 0  Control/stop worrying 0 0 0 0  Worry too much - different things 0 0 0 0  Trouble relaxing 0 0 1 0  Restless 0 0 3 2  Easily annoyed or irritable 0 0 3 1  Afraid - awful might happen 0 0 0 0  Total GAD 7 Score 0 0 10 3  Anxiety Difficulty Not difficult at all Not difficult at all Extremely difficult Somewhat difficult    Immunization History  Administered Date(s) Administered  . Influenza Split 03/30/2014  . Influenza,inj,Quad PF,6+ Mos 11/25/2017, 12/14/2018  . Tdap 03/30/2014   Past Medical History:  Diagnosis Date  . Allergy   . Asthma   . Chicken pox   . Retinal hemorrhage 09/02/2017   Allergies  Allergen Reactions  . Penicillins Other (See Comments)    Unsure has not taken since childhood Told he was allergic as a child Told he was allergic  as a child    Past Surgical History:  Procedure Laterality Date  . NO PAST SURGERIES     Family History  Problem Relation Age of Onset  . Diabetes Father   . Heart disease Father   . Hyperlipidemia Father   . Hypertension Father   . Drug abuse Sister   . Alcohol abuse Sister   . Arthritis Paternal Grandmother   . Depression Paternal Grandmother   . Diabetes Paternal Grandmother   . Hyperlipidemia Paternal Grandmother   . Heart disease Paternal Grandmother   . Hypertension Paternal Grandmother   . Arthritis Paternal  Grandfather   . Hearing loss Paternal Grandfather   . Heart disease Paternal Grandfather   . Hyperlipidemia Paternal Grandfather   . Hypertension Paternal Grandfather   . Kidney disease Paternal Grandfather   . 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    Drinks caffeine.    Smoke alarm in the home. Wears his seatbelt.    Feels safe in his relationship.     Allergies as of 12/14/2018      Reactions   Penicillins Other (See Comments)   Unsure has not taken since childhood Told he was allergic as a child Told he was allergic as a child      Medication List       Accurate as of December 14, 2018 12:31 PM. If you have any questions, ask your nurse or doctor.        STOP taking these medications   diltiazem 2 % Gel Stopped by: Felix Pacini, DO   hydrocortisone 2.5 % rectal cream Commonly known as: ANUSOL-HC Stopped by: Felix Pacini, DO   hydrocortisone 25 MG suppository Commonly known as: ANUSOL-HC Stopped by: Felix Pacini, DO   Lidocaine (Anorectal) 5 % Gel Stopped by: Felix Pacini, DO     TAKE these medications   cetirizine 10 MG tablet Commonly known as: ZYRTEC Take 10 mg by mouth daily.   FLUoxetine 20 MG tablet Commonly known as: PROZAC Take 2 tablets (40 mg total) by mouth daily.   lisinopril 10 MG tablet Commonly known as: ZESTRIL Take 1 tablet (10 mg total) by mouth daily.   triamcinolone cream 0.1 % Commonly known as: KENALOG Apply 1 application topically 2 (two) times daily.   venlafaxine XR 75 MG 24 hr capsule Commonly known as: EFFEXOR-XR Take 3 capsules (225 mg total) by mouth daily with breakfast.       All past medical history, surgical history, allergies, family history, immunizations andmedications were updated in the EMR today and reviewed under the history and medication portions of their EMR.     No results found for this or any  previous visit (from the past 2160 hour(s)).  Patient was never admitted.   ROS: 14 pt review of systems performed and negative (unless mentioned in an HPI)   Objective: BP 121/81 (BP Location: Left Arm, Patient Position: Sitting, Cuff Size: Large)   Pulse 66   Temp 97.6 F (36.4 C) (Temporal)   Resp 16   Ht 6\' 3"  (1.905 m)   Wt (!) 316 lb (143.3 kg)   SpO2 98%   BMI 39.50 kg/m  Gen: Afebrile. No acute distress. Nontoxic in appearance, well-developed, well-nourished, pleasant, obese Caucasian male. HENT: AT. . Bilateral TM visualized and normal in appearance, normal external auditory canal. MMM, no oral lesions, adequate dentition. Bilateral  nares within normal limits. Throat without erythema, ulcerations or exudates.  No cough on exam, no hoarseness on exam. Eyes:Pupils Equal Round Reactive to light, Extraocular movements intact,  Conjunctiva without redness, discharge or icterus. Neck/lymp/endocrine: Supple, no lymphadenopathy, no thyromegaly CV: RRR no murmur, no edema, +2/4 P posterior tibialis pulses.  No carotid bruits. No JVD. Chest: CTAB, no wheeze, rhonchi or crackles.  Normal respiratory effort.  Good air movement. Abd: Soft.  Obese. NTND. BS present.  No masses palpated. No hepatosplenomegaly. No rebound tenderness or guarding. Skin: Mild erythemic fine rash bilateral shins.  No purpura or petechiae. Warm and well-perfused. Skin intact. Neuro/Msk:  Normal gait. PERLA. EOMi. Alert. Oriented x3.  Cranial nerves II through XII intact. Muscle strength 5/5 upper/lower extremity. DTRs equal bilaterally. Psych: Normal affect, dress and demeanor. Normal speech. Normal thought content and judgment.   No exam data present  Assessment/plan: Nicholas Anthony is a 38 y.o. male present for CPE Mild anxiety/Attention deficit -Stable on current regimen. -Continue Effexor 225 mg daily.   -Continue Prozac 40 mg daily.  -Refills provided today -Follow-up 6 months   Hypertension/obesity/dizziness:  -BP stable.  Having some orthostatic symptoms- but BP is normal here and at home. He hydrates - drinking water all day.R/O electrolytes and will discuss further plan on dizziness after labs received.  - Continue lisinopril 10 mg daily.   - Continue diet and weight loss regimen, routine exercise.  -Low sodium diet. -CBC, CMP, lipid panel, TSH -Follow-up in 6 months  Rash/tick bites:  - PMLE on shins after sunburn- kenalog prescribed and education provided.  - sun screen and cover while in the sun.   - recent tick bites x2 trunk- do not appear infected. No systemic symptoms or related rash. Discussed monitor area and for signs and  Symptoms of tick bourne disease. Follow up PRN  Need for influenza vaccination - Flu Vaccine QUAD 36+ mos IM  Prediabetes - Hemoglobin A1c  Encounter for preventive health examination Patient was encouraged to exercise greater than 150 minutes a week. Patient was encouraged to choose a diet filled with fresh fruits and vegetables, and lean meats. AVS provided to patient today for education/recommendation on gender specific health and safety maintenance. Colonoscopy: No fhx. Routine screen. Immunizations: tdap UTD 2016, Influenza provided today (encouraged yearly) Infectious disease screening: HIV declined. PSA: no fhx. Routine screen.  Annual physical completed today in addition to > 15 minutes spent with patient, > 50% of that time face to face discussing, evaluating acute and chronic problems.   Return in about 6 months (around 06/13/2019) for [place] (30 min). 1 year for annual physical Electronically signed by: Howard Pouch, DO [place]

## 2018-12-14 NOTE — Patient Instructions (Signed)

## 2018-12-15 ENCOUNTER — Telehealth: Payer: Self-pay

## 2018-12-15 ENCOUNTER — Telehealth: Payer: Self-pay | Admitting: Family Medicine

## 2018-12-15 DIAGNOSIS — F419 Anxiety disorder, unspecified: Secondary | ICD-10-CM

## 2018-12-15 DIAGNOSIS — R41840 Attention and concentration deficit: Secondary | ICD-10-CM

## 2018-12-15 MED ORDER — VENLAFAXINE HCL ER 75 MG PO CP24: 225.0000 mg | ORAL_CAPSULE | Freq: Every day | ORAL | 5 refills | Status: DC

## 2018-12-15 NOTE — Telephone Encounter (Signed)
Fax sent from pharmacy ask for a quantity of 90 to give patient a full 30 day supply of Effexor. No changes were made at CPE on 12/14/2018.

## 2018-12-15 NOTE — Telephone Encounter (Signed)
Please inform patient the following information: - His labs are all normal and look great.  - His labs do not give the cause for his dizziness upon standing, since he is not anemic and electrolytes are all normal. His fluoxetine and Effexor have 1-2 % side effect of potentially causing this, so its not common, but it is possible those symptoms are from his medication.    - Since the cause is not anemia,low BP, electrolyte imbalances and his is hydrating,if the symptoms are tolerable, I would not suggest changes- but just caution on standing quickly. Stand slowly, count to 5, then walk.  If symptoms worsen or are not tolerable >> then follow up and we will need to consider further work up or changing meds. Thanks.

## 2018-12-15 NOTE — Telephone Encounter (Signed)
Pt was called and given information. He verbalized understanding  

## 2019-01-10 ENCOUNTER — Other Ambulatory Visit: Payer: Self-pay

## 2019-01-10 DIAGNOSIS — Z20822 Contact with and (suspected) exposure to covid-19: Secondary | ICD-10-CM

## 2019-01-11 LAB — NOVEL CORONAVIRUS, NAA: SARS-CoV-2, NAA: NOT DETECTED

## 2019-03-21 ENCOUNTER — Other Ambulatory Visit: Payer: Self-pay

## 2019-03-21 ENCOUNTER — Encounter: Payer: Self-pay | Admitting: Family Medicine

## 2019-03-21 ENCOUNTER — Ambulatory Visit (INDEPENDENT_AMBULATORY_CARE_PROVIDER_SITE_OTHER): Payer: Managed Care, Other (non HMO) | Admitting: Family Medicine

## 2019-03-21 VITALS — Temp 97.1°F

## 2019-03-21 DIAGNOSIS — Z20822 Contact with and (suspected) exposure to covid-19: Secondary | ICD-10-CM

## 2019-03-21 DIAGNOSIS — J4521 Mild intermittent asthma with (acute) exacerbation: Secondary | ICD-10-CM

## 2019-03-21 DIAGNOSIS — Z20828 Contact with and (suspected) exposure to other viral communicable diseases: Secondary | ICD-10-CM

## 2019-03-21 DIAGNOSIS — J189 Pneumonia, unspecified organism: Secondary | ICD-10-CM

## 2019-03-21 MED ORDER — AZITHROMYCIN 250 MG PO TABS: ORAL_TABLET | ORAL | 0 refills | Status: DC

## 2019-03-21 MED ORDER — ALBUTEROL SULFATE HFA 108 (90 BASE) MCG/ACT IN AERS: 2.0000 | INHALATION_SPRAY | RESPIRATORY_TRACT | 0 refills | Status: DC | PRN

## 2019-03-21 MED ORDER — PREDNISONE 20 MG PO TABS: ORAL_TABLET | ORAL | 0 refills | Status: DC

## 2019-03-21 NOTE — Progress Notes (Signed)
Virtual Visit via Video Note  I connected with pt on 03/21/19 at  9:30 AM EST by a video enabled telemedicine application and verified that I am speaking with the correct person using two identifiers.  Location patient: home Location provider:work or home office Persons participating in the virtual visit: patient, provider  I discussed the limitations of evaluation and management by telemedicine and the availability of in person appointments. The patient expressed understanding and agreed to proceed.  Telemedicine visit is a necessity given the COVID-19 restrictions in place at the current time.  HPI: 38 y/o WM being seen today for respiratory complaints. Onset of feeling bad 6 d/a, HA and deep cough.  Nonproductive.  No fever.  Runny nose, mild ST from coughing.  No loss of sense of taste or smell.  NO distinct SOB but a bit of more feeling of difficulty getting breaths in/out.  Very fatigued.  No body aches.  Some nausea.  No change in bowel habits. Some feeling of pain/tightness in sinus regions.  +Wheezing "pretty bad".  He doesn't have an inhaler and says he has never been rx'd one.  Robitussin and mucinex DM and ibuprofen.   He feels like his illness is about the same as at onset.  Wife had positive rapid covid 19 test 03/15/19 b/c of exposure to coworker with covid illness. Wife got some nasal cong/cough (mild) shortly after being tested.  No fever.  3 kids at home w/out any sx's at all.  Pt had neg covid 19 test 03/19/19. This was self-swab [facility] test.    ROS: See pertinent positives and negatives per HPI.  Past Medical History:  Diagnosis Date  . Allergy   . Asthma   . Chicken pox   . Retinal hemorrhage 09/02/2017  Hx of childhood asthma---occ sx's as adult with severe allergy flares.  Past Surgical History:  Procedure Laterality Date  . NO PAST SURGERIES      Family History  Problem Relation Age of Onset  . Diabetes Father   . Heart disease Father   .  Hyperlipidemia Father   . Hypertension Father   . Drug abuse Sister   . Alcohol abuse Sister   . Arthritis Paternal Grandmother   . Depression Paternal Grandmother   . Diabetes Paternal Grandmother   . Hyperlipidemia Paternal Grandmother   . Heart disease Paternal Grandmother   . Hypertension Paternal Grandmother   . Arthritis Paternal Grandfather   . Hearing loss Paternal Grandfather   . Heart disease Paternal Grandfather   . Hyperlipidemia Paternal Grandfather   . Hypertension Paternal Grandfather   . Kidney disease Paternal Grandfather   . Drug abuse Brother     SOCIAL HX:  Social History   Socioeconomic History  . Marital status: Married    Spouse name: Not on file  . Number of children: Not on file  . Years of education: Not on file  . Highest education level: Not on file  Occupational History  . Not on file  Tobacco Use  . Smoking status: Never Smoker  . Smokeless tobacco: Never Used  Substance and Sexual Activity  . Alcohol use: Never  . Drug use: Never  . Sexual activity: Yes    Partners: Female  Other Topics Concern  . 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    Drinks caffeine.    Smoke alarm in the home. Wears his seatbelt.  Feels safe in his relationship.    Social Determinants of Health   Financial Resource Strain:   . Difficulty of Paying Living Expenses: Not on file  Food Insecurity:   . Worried About Charity fundraiser in the Last Year: Not on file  . Ran Out of Food in the Last Year: Not on file  Transportation Needs:   . Lack of Transportation (Medical): Not on file  . Lack of Transportation (Non-Medical): Not on file  Physical Activity:   . Days of Exercise per Week: Not on file  . Minutes of Exercise per Session: Not on file  Stress:   . Feeling of Stress : Not on file  Social Connections:   . Frequency of Communication with Friends and Family: Not on file  .  Frequency of Social Gatherings with Friends and Family: Not on file  . Attends Religious Services: Not on file  . Active Member of Clubs or Organizations: Not on file  . Attends Archivist Meetings: Not on file  . Marital Status: Not on file      Current Outpatient Medications:  .  cetirizine (ZYRTEC) 10 MG tablet, Take 10 mg by mouth daily., Disp: , Rfl:  .  FLUoxetine (PROZAC) 20 MG tablet, Take 2 tablets (40 mg total) by mouth daily., Disp: 60 tablet, Rfl: 5 .  lisinopril (ZESTRIL) 10 MG tablet, Take 1 tablet (10 mg total) by mouth daily., Disp: 30 tablet, Rfl: 5 .  triamcinolone cream (KENALOG) 0.1 %, Apply 1 application topically 2 (two) times daily., Disp: 30 g, Rfl: 2 .  venlafaxine XR (EFFEXOR-XR) 75 MG 24 hr capsule, Take 3 capsules (225 mg total) by mouth daily with breakfast., Disp: 90 capsule, Rfl: 5  EXAM:  VITALS per patient if applicable: There were no vitals taken for this visit.   GENERAL: alert, oriented, appears well and in no acute distress  HEENT: atraumatic, conjunttiva clear, no obvious abnormalities on inspection of external nose and ears  NECK: normal movements of the head and neck  LUNGS: on inspection no signs of respiratory distress, breathing rate appears normal, no obvious gross SOB, gasping or wheezing  CV: no obvious cyanosis  MS: moves all visible extremities without noticeable abnormality  PSYCH/NEURO: pleasant and cooperative, no obvious depression or anxiety, speech and thought processing grossly intact  LABS: none today  COVID 19 NEG 03/12/19     Chemistry      Component Value Date/Time   NA 137 12/14/2018 0829   K 4.8 12/14/2018 0829   CL 103 12/14/2018 0829   CO2 26 12/14/2018 0829   BUN 17 12/14/2018 0829   CREATININE 0.92 12/14/2018 0829      Component Value Date/Time   CALCIUM 9.4 12/14/2018 0829   ALKPHOS 55 12/14/2018 0829   AST 32 12/14/2018 0829   ALT 38 12/14/2018 0829   BILITOT 0.6 12/14/2018 0829       Lab Results  Component Value Date   WBC 7.4 12/14/2018   HGB 14.9 12/14/2018   HCT 45.4 12/14/2018   MCV 86.1 12/14/2018   PLT 274.0 12/14/2018   Lab Results  Component Value Date   HGBA1C 5.6 12/14/2018    ASSESSMENT AND PLAN:  Discussed the following assessment and plan:  Covid 19 infection suspected. I think he may have superimposed bacterial pneumonia vs covid sx's that are just a little delayed in improving.  No red flag symptoms. He does have mild intermittent asthma and this illness has caused mild  flare. Plan: no repeat covid test necessary. Prednisone 40mg  qd x 5d.  Albuterol HFA 2 p q4h prn. Z-pack. Therapeutic expectations and side effect profile of medication discussed today.  Patient's questions answered. Signs/symptoms to call or return for were reviewed and pt expressed understanding.  -we discussed possible serious and likely etiologies, options for evaluation and workup, limitations of telemedicine visit vs in person visit, treatment, treatment risks and precautions. Pt prefers to treat via telemedicine empirically rather then risking or undertaking an in person visit at this moment. Patient agrees to seek prompt in person care if worsening, new symptoms arise, or if is not improving with treatment.   I discussed the assessment and treatment plan with the patient. The patient was provided an opportunity to ask questions and all were answered. The patient agreed with the plan and demonstrated an understanding of the instructions.   The patient was advised to call back or seek an in-person evaluation if the symptoms worsen or if the condition fails to improve as anticipated.  F/u: if not improving in 2-3 d or if worsening prior to this  Signed:  Santiago BumpersPhil Chealsea Paske, MD           03/21/2019

## 2019-04-03 ENCOUNTER — Encounter: Payer: Self-pay | Admitting: Family Medicine

## 2019-04-03 ENCOUNTER — Other Ambulatory Visit: Payer: Self-pay

## 2019-04-03 ENCOUNTER — Ambulatory Visit (INDEPENDENT_AMBULATORY_CARE_PROVIDER_SITE_OTHER): Payer: Managed Care, Other (non HMO) | Admitting: Family Medicine

## 2019-04-03 ENCOUNTER — Ambulatory Visit
Admission: RE | Admit: 2019-04-03 | Discharge: 2019-04-03 | Disposition: A | Discharge status: Home / Self Care | Payer: Managed Care, Other (non HMO) | Source: Ambulatory Visit | Attending: Family Medicine | Admitting: Family Medicine

## 2019-04-03 VITALS — BP 143/90 | HR 91 | Temp 98.2°F | Resp 16 | Ht 75.0 in | Wt 339.5 lb

## 2019-04-03 DIAGNOSIS — R1031 Right lower quadrant pain: Secondary | ICD-10-CM

## 2019-04-03 DIAGNOSIS — R062 Wheezing: Secondary | ICD-10-CM | POA: Diagnosis not present

## 2019-04-03 DIAGNOSIS — R1909 Other intra-abdominal and pelvic swelling, mass and lump: Secondary | ICD-10-CM

## 2019-04-03 DIAGNOSIS — J180 Bronchopneumonia, unspecified organism: Secondary | ICD-10-CM | POA: Diagnosis not present

## 2019-04-03 MED ORDER — PREDNISONE 20 MG PO TABS: 40.0000 mg | ORAL_TABLET | Freq: Every day | ORAL | 0 refills | Status: DC

## 2019-04-03 MED ORDER — ALBUTEROL SULFATE HFA 108 (90 BASE) MCG/ACT IN AERS: 2.0000 | INHALATION_SPRAY | RESPIRATORY_TRACT | 0 refills | Status: DC | PRN

## 2019-04-03 MED ORDER — DOXYCYCLINE HYCLATE 100 MG PO TABS: 100.0000 mg | ORAL_TABLET | Freq: Two times a day (BID) | ORAL | 0 refills | Status: DC

## 2019-04-03 MED ORDER — METHYLPREDNISOLONE ACETATE 80 MG/ML IJ SUSP
80.0000 mg | Freq: Once | INTRAMUSCULAR | Status: AC
  Administered 2019-04-03: 80 mg via INTRAMUSCULAR

## 2019-04-03 MED ORDER — METHYLPREDNISOLONE ACETATE 40 MG/ML IJ SUSP
40.0000 mg | Freq: Once | INTRAMUSCULAR | Status: AC
  Administered 2019-04-03: 40 mg via INTRAMUSCULAR

## 2019-04-03 NOTE — Patient Instructions (Signed)
Start doxy (antibioitc) today- every 12 hours with a little food on stomach.   Steroid injection today- start oral steroid on Thursday.  Have chest xray today. We will call you with results.   I will order an image study of your abdomen for hernia work up- they will call you to schedule.      Inguinal Hernia, Adult An inguinal hernia is when fat or your intestines push through a weak spot in a muscle where your leg meets your lower belly (groin). This causes a rounded lump (bulge). This kind of hernia could also be:  In your scrotum, if you are male.  In folds of skin around your vagina, if you are male. There are three types of inguinal hernias. These include:  Hernias that can be pushed back into the belly (are reducible). This type rarely causes pain.  Hernias that cannot be pushed back into the belly (are incarcerated).  Hernias that cannot be pushed back into the belly and lose their blood supply (are strangulated). This type needs emergency surgery. If you do not have symptoms, you may not need treatment. If you have symptoms or a large hernia, you may need surgery. Follow these instructions at home: Lifestyle  Do these things if told by your doctor so you do not have trouble pooping (constipation): ? Drink enough fluid to keep your pee (urine) pale yellow. ? Eat foods that have a lot of fiber. These include fresh fruits and vegetables, whole grains, and beans. ? Limit foods that are high in fat and processed sugars. These include foods that are fried or sweet. ? Take medicine for trouble pooping.  Avoid lifting heavy objects.  Avoid standing for long amounts of time.  Do not use any products that contain nicotine or tobacco. These include cigarettes and e-cigarettes. If you need help quitting, ask your doctor.  Stay at a healthy weight. General instructions  You may try to push your hernia in by very gently pressing on it when you are lying down. Do not try to  force the bulge back in if it will not push in easily.  Watch your hernia for any changes in shape, size, or color. Tell your doctor if you see any changes.  Take over-the-counter and prescription medicines only as told by your doctor.  Keep all follow-up visits as told by your doctor. This is important. Contact a doctor if:  You have a fever.  You have new symptoms.  Your symptoms get worse. Get help right away if:  The area where your leg meets your lower belly has: ? Pain that gets worse suddenly. ? A bulge that gets bigger suddenly, and it does not get smaller after that. ? A bulge that turns red or purple. ? A bulge that is painful when you touch it.  You are a man, and your scrotum: ? Suddenly feels painful. ? Suddenly changes in size.  You cannot push the hernia in by very gently pressing on it when you are lying down. Do not try to force the bulge back in if it will not push in easily.  You feel sick to your stomach (nauseous), and that feeling does not go away.  You throw up (vomit), and that keeps happening.  You have a fast heartbeat.  You cannot poop (have a bowel movement) or pass gas. These symptoms may be an emergency. Do not wait to see if the symptoms will go away. Get medical help right away. Call your local  emergency services (911 in the [place] Summary  An inguinal hernia is when fat or your intestines push through a weak spot in a muscle where your leg meets your lower belly (groin). This causes a rounded lump (bulge).  If you do not have symptoms, you may not need treatment. If you have symptoms or a large hernia, you may need surgery.  Avoid lifting heavy objects. Also avoid standing for long amounts of time.  Do not try to force the bulge back in if it will not push in easily. This information is not intended to replace advice given to you by your health care provider. Make sure you discuss any questions you have with your health care  provider. Document Revised: 04/09/2017 Document Reviewed: 12/08/2016 Elsevier Patient Education  2020 [employer].

## 2019-04-03 NOTE — Progress Notes (Signed)
This visit occurred during the SARS-CoV-2 public health emergency.  Safety protocols were in place, including screening questions prior to the visit, additional usage of staff PPE, and extensive cleaning of exam room while observing appropriate contact time as indicated for disinfecting solutions.    Nicholas Anthony , 1980-08-28, 39 y.o., male MRN: [id number] Patient Care Team    Relationship Specialty Notifications Start End  Natalia Leatherwood, DO PCP - General Family Medicine  09/12/17     Chief Complaint  Patient presents with  . Hernia    R inguinal hernia that patient feels when coughing. Tested negative for COVID but wife had COVID. Pt states no pain, just uncomfortable      Subjective: Pt presents for an OV with complaints of right lower pelvic pain greater than 1 year.  He states he had a recent illness in which he had an increased cough and he has had notable increase in discomfort in his right lower pelvic.  He states he feels like he needs to keep pressure over this area when he coughs and he can feel a bulging in this area.  He states that it has become more more uncomfortable.  He has had bowel changes since his sphincterectomy over the summer he does not feel he has had any new bowel changes since increase in discomfort on his lower abdomen.  He does have heavy lifting duties daily with his job.    Patient also endorses continued wheezing.  He and his wife are ill.  His wife tested positive for Covid and he had symptoms that started on March 15, 2019.  He tested early on and was negative, however his symptoms progressed and he did not get retested.  He will likely did have COVID-19 since his wife was positive.  He was treated with a Z-Pak and prednisone on 03/21/2019.  He states he continues to have an occasional cough and he is still short of breath and feels winded frequently.  He has wheezing.  He is using an albuterol inhaler approximately every 6 hours.  He used his albuterol  just 2 hours prior to his appointment today.  Depression screen [facility] 2/9 12/14/2018 05/26/2018 11/25/2017 09/12/2017  Decreased Interest 0 0 0 0  Down, Depressed, Hopeless 0 0 2 0  PHQ - 2 Score 0 0 2 0  Altered sleeping 3 3 0 -  Tired, decreased energy 0 0 0 -  Change in appetite 0 0 0 -  Feeling bad or failure about yourself  0 0 0 -  Trouble concentrating 1 0 3 -  Moving slowly or fidgety/restless 0 0 3 -  Suicidal thoughts 0 0 0 -  PHQ-9 Score 4 3 8  -  Difficult doing work/chores Not difficult at all Not difficult at all Very difficult -    Allergies  Allergen Reactions  . Penicillins Other (See Comments)    Unsure has not taken since childhood Told he was allergic as a child Told he was allergic as a child    Social History   Social History Narrative   Married, employed,    In college. Currently working as a .    Drinks caffeine.    Smoke alarm in the home. Wears his seatbelt.    Feels safe in his relationship.    Past Medical History:  Diagnosis Date  . Allergy   . Asthma   . Chicken pox   . 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 SPHINCTEROTOMY  2020   Family History  Problem Relation Age of Onset  . Diabetes Father   . Heart disease Father   . Hyperlipidemia Father   . Hypertension Father   . Drug abuse Sister   . Alcohol abuse Sister   . Arthritis Paternal Grandmother   . Depression Paternal Grandmother   . Diabetes Paternal Grandmother   . Hyperlipidemia Paternal Grandmother   . Heart disease Paternal Grandmother   . Hypertension Paternal Grandmother   . Arthritis Paternal Grandfather   . Hearing loss Paternal Grandfather   . Heart disease Paternal Grandfather   . Hyperlipidemia Paternal Grandfather   . Hypertension Paternal Grandfather   . Kidney disease Paternal Grandfather   . Drug abuse Brother    Allergies as of 04/03/2019      Reactions   Penicillins Other (See Comments)   Unsure  has not taken since childhood Told he was allergic as a child Told he was allergic as a child      Medication List       Accurate as of April 03, 2019 11:59 PM. If you have any questions, ask your nurse or doctor.        STOP taking these medications   azithromycin 250 MG tablet Commonly known as: ZITHROMAX Stopped by: Howard Pouch, DO   cetirizine 10 MG tablet Commonly known as: ZYRTEC Stopped by: Howard Pouch, DO     TAKE these medications   albuterol 108 (90 Base) MCG/ACT inhaler Commonly known as: Ventolin HFA Inhale 2 puffs into the lungs every 4 (four) hours as needed for wheezing or shortness of breath.   doxycycline 100 MG tablet Commonly known as: VIBRA-TABS Take 1 tablet (100 mg total) by mouth 2 (two) times daily. Started by: Howard Pouch, DO   FLUoxetine 20 MG tablet Commonly known as: PROZAC Take 2 tablets (40 mg total) by mouth daily.   lisinopril 10 MG tablet Commonly known as: ZESTRIL Take 1 tablet (10 mg total) by mouth daily.   predniSONE 20 MG tablet Commonly known as: DELTASONE Take 2 tablets (40 mg total) by mouth daily with breakfast. What changed:   how much to take  how to take this  when to take this  additional instructions Changed by: Howard Pouch, DO   triamcinolone cream 0.1 % Commonly known as: KENALOG Apply 1 application topically 2 (two) times daily.   venlafaxine XR 75 MG 24 hr capsule Commonly known as: EFFEXOR-XR Take 3 capsules (225 mg total) by mouth daily with breakfast.       All past medical history, surgical history, allergies, family history, immunizations andmedications were updated in the EMR today and reviewed under the history and medication portions of their EMR.     ROS: Negative, with the exception of above mentioned in HPI   Objective:  BP (!) 143/90 (BP Location: Right Arm, Patient Position: Sitting, Cuff Size: Normal)   Pulse 91   Temp 98.2 F (36.8 C) (Temporal)   Resp 16   Ht 6\' 3"   (1.905 m)   Wt (!) 339 lb 8 oz (154 kg)   SpO2 95%   BMI 42.43 kg/m  Body mass index is 42.43 kg/m. Gen: Afebrile. No acute distress. Nontoxic in appearance, well developed, well nourished.  HENT: AT. [place]. Eyes:Pupils Equal Round Reactive to light, Extraocular movements intact,  Conjunctiva without redness, discharge or icterus. Neck/lymp/endocrine: Supple,no lymphadenopathy CV: RRR, no edema Chest: bilateral wheezing present. Normal WOB. Good air  flow.  Abd: Soft. Obese. ND. BS present. Small right inguinal present - difficult exam d/t habitus. mildy TTP over this area. Palpable inguinal  protrusion with cough. No rebound or guarding.  Neuro: Normal gait. PERLA. EOMi. Alert. Oriented x3  Psych: Normal affect, dress and demeanor. Normal speech. Normal thought content and judgment.  No exam data present No results found. No results found for this or any previous visit (from the past 24 hour(s)).  Assessment/Plan: Jordy Verba is a 39 y.o. male present for OV for  Wheezing/Bronchial pneumonia Rest.  Hydrate.  Will obtain x-ray today to rule out pneumonia. Start doxycycline twice daily x10 days.  IM Depo-Medrol injection provided today.  Start prednisone orally in 2 days. Albuterol every 4-6 hours as needed. - DG Chest 2 View; Future - methylPREDNISolone acetate (DEPO-MEDROL) injection 80 mg - methylPREDNISolone acetate (DEPO-MEDROL) injection 40 mg Follow-up in 10 days - 2 weeks if symptoms are not resolved. Emergent precautions discussed.  Right inguinal pain/Inguinal mass Refrain from heavy lifting if able.  NSAIDs for discomfort. Limited ultrasound of right pelvis ordered to confirm  Hernia. Referral to surgery will be placed if ultrasound is positive for hernia. Emergent precautions discussed.   Reviewed expectations re: course of current medical issues.  Discussed self-management of symptoms.  Outlined signs and symptoms indicating need for more acute  intervention.  Patient verbalized understanding and all questions were answered.  Patient received an After-Visit Summary.    Orders Placed This Encounter  Procedures  . DG Chest 2 View  . US Pelvis Limited   Meds ordered this encounter  Medications  . doxycycline (VIBRA-TABS) 100 MG tablet    Sig: Take 1 tablet (100 mg total) by mouth 2 (two) times daily.    Dispense:  20 tablet    Refill:  0  . predniSONE (DELTASONE) 20 MG tablet    Sig: Take 2 tablets (40 mg total) by mouth daily with breakfast.    Dispense:  6 tablet    Refill:  0  . albuterol (VENTOLIN HFA) 108 (90 Base) MCG/ACT inhaler    Sig: Inhale 2 puffs into the lungs every 4 (four) hours as needed for wheezing or shortness of breath.    Dispense:  18 g    Refill:  0  . methylPREDNISolone acetate (DEPO-MEDROL) injection 80 mg  . methylPREDNISolone acetate (DEPO-MEDROL) injection 40 mg     Note is dictated utilizing voice recognition software. Although note has been proof read prior to signing, occasional typographical errors still can be missed. If any questions arise, please do not hesitate to call for verification.   electronically signed by:  Felix Pacini, DO  [facility] [place]

## 2019-04-05 ENCOUNTER — Encounter: Payer: Self-pay | Admitting: Family Medicine

## 2019-04-12 ENCOUNTER — Ambulatory Visit
Admission: RE | Admit: 2019-04-12 | Discharge: 2019-04-12 | Disposition: A | Discharge status: Home / Self Care | Payer: Managed Care, Other (non HMO) | Source: Ambulatory Visit | Attending: Family Medicine | Admitting: Family Medicine

## 2019-04-12 DIAGNOSIS — R1031 Right lower quadrant pain: Secondary | ICD-10-CM

## 2019-04-12 DIAGNOSIS — R1909 Other intra-abdominal and pelvic swelling, mass and lump: Secondary | ICD-10-CM

## 2019-04-18 ENCOUNTER — Ambulatory Visit (INDEPENDENT_AMBULATORY_CARE_PROVIDER_SITE_OTHER): Payer: Managed Care, Other (non HMO) | Admitting: Family Medicine

## 2019-04-18 ENCOUNTER — Other Ambulatory Visit: Payer: Self-pay

## 2019-04-18 ENCOUNTER — Encounter: Payer: Self-pay | Admitting: Family Medicine

## 2019-04-18 VITALS — Ht 75.0 in | Wt 334.0 lb

## 2019-04-18 DIAGNOSIS — R1903 Right lower quadrant abdominal swelling, mass and lump: Secondary | ICD-10-CM | POA: Diagnosis not present

## 2019-04-18 DIAGNOSIS — R1031 Right lower quadrant pain: Secondary | ICD-10-CM | POA: Diagnosis not present

## 2019-04-18 NOTE — Progress Notes (Signed)
VIRTUAL VISIT VIA VIDEO  I connected with Wilburn Mylar on 04/18/19 at  1:30 PM EST by a video enabled telemedicine application and verified that I am speaking with the correct person using two identifiers. Location patient: Home Location provider: [facility], Office Persons participating in the virtual visit: Patient, Dr. Raoul Pitch and R.Baker, LPN  I discussed the limitations of evaluation and management by telemedicine and the availability of in person appointments. The patient expressed understanding and agreed to proceed.   SUBJECTIVE Chief Complaint  Patient presents with  . Follow-up    Discuss [place] results and what to do next    HPI: Matther Labell is a 39 y.o. male present for follow-up on his abdominal pain.  Patient reports he continues to have right lower quadrant pain that is worse with coughing and lifting heavy objects at work.  He states he can feel a bulge in his right inguinal area when coughing and lifting.  Reviewed ultrasound results with him today which do not show evidence of a hernia.  He reports he also now feels pressure down into his right scrotum at times.  Prior note: Pt presents for an OV with complaints of right lower pelvic pain greater than 1 year.  He states he had a recent illness in which he had an increased cough and he has had notable increase in discomfort in his right lower pelvic.  He states he feels like he needs to keep pressure over this area when he coughs and he can feel a bulging in this area.  He states that it has become more more uncomfortable.  He has had bowel changes since his sphincterectomy over the summer he does not feel he has had any new bowel changes since increase in discomfort on his lower abdomen.  He does have heavy lifting duties daily with his job DG Chest 2 View  Result Date: 04/03/2019 CLINICAL DATA:  Wheezing and shortness of breath EXAM: CHEST - 2 VIEW COMPARISON:  None. FINDINGS: Lungs are clear. Heart size and  pulmonary vascularity are normal. No adenopathy. No bone lesions. IMPRESSION: Lungs clear.  No evident adenopathy. Electronically Signed   By: Lowella Grip III M.D.   On: 04/03/2019 11:08   US Pelvis Limited  Result Date: 04/12/2019 CLINICAL DATA:  39 year old male with right inguinal pain. Concern for hernia. EXAM: LIMITED ULTRASOUND OF PELVIS TECHNIQUE: Limited transabdominal ultrasound examination of the pelvis was performed. COMPARISON:  None. FINDINGS: Targeted sonographic images of the soft tissues of the right groin was performed using grayscale color Doppler. There is a normal appearing lymph node measuring approximately 4 mm in short axis. No mass, fluid collection, or hernia. No abnormal vascularity. IMPRESSION: Unremarkable targeted ultrasound of the right groin. Electronically Signed   By: Anner Crete M.D.   On: 04/12/2019 20:29     ROS: See pertinent positives and negatives per HPI.  Patient Active Problem List   Diagnosis Date Noted  . Right inguinal pain 04/03/2019  . Bronchial pneumonia 04/03/2019  . Wheezing 04/03/2019  . Inguinal mass 04/03/2019  . Orthostatic dizziness 12/14/2018  . Tick bite 12/14/2018  . Anal fissure 09/12/2018  . Corn of foot 05/26/2018  . Nonintractable headache 03/24/2018  . Attention deficit 11/25/2017  . Mild anxiety 11/25/2017  . Prediabetes 10/03/2017  . Ophthalmologic abnormality 09/12/2017  . Morbid obesity ([place]) 09/12/2017  . Essential hypertension 09/12/2017    Social History   Tobacco Use  . Smoking status: Never Smoker  . Smokeless  tobacco: Never Used  Substance Use Topics  . Alcohol use: Never    Current Outpatient Medications:  .  albuterol (VENTOLIN HFA) 108 (90 Base) MCG/ACT inhaler, Inhale 2 puffs into the lungs every 4 (four) hours as needed for wheezing or shortness of breath., Disp: 18 g, Rfl: 0 .  doxycycline (VIBRA-TABS) 100 MG tablet, Take 1 tablet (100 mg total) by mouth 2 (two) times daily., Disp: 20  tablet, Rfl: 0 .  FLUoxetine (PROZAC) 20 MG tablet, Take 2 tablets (40 mg total) by mouth daily., Disp: 60 tablet, Rfl: 5 .  lisinopril (ZESTRIL) 10 MG tablet, Take 1 tablet (10 mg total) by mouth daily., Disp: 30 tablet, Rfl: 5 .  predniSONE (DELTASONE) 20 MG tablet, Take 2 tablets (40 mg total) by mouth daily with breakfast., Disp: 6 tablet, Rfl: 0 .  triamcinolone cream (KENALOG) 0.1 %, Apply 1 application topically 2 (two) times daily., Disp: 30 g, Rfl: 2 .  venlafaxine XR (EFFEXOR-XR) 75 MG 24 hr capsule, Take 3 capsules (225 mg total) by mouth daily with breakfast., Disp: 90 capsule, Rfl: 5  Allergies  Allergen Reactions  . Penicillins Other (See Comments)    Unsure has not taken since childhood Told he was allergic as a child Told he was allergic as a child     OBJECTIVE: Ht 6\' 3"  (1.905 m)   Wt (!) 334 lb (151.5 kg)   BMI 41.75 kg/m  Gen: No acute distress. Nontoxic in appearance.  HENT: AT. [place].  MMM.  Eyes:Pupils Equal Round Reactive to light, Extraocular movements intact,  Conjunctiva without redness, discharge or icterus. Neuro: . Alert. Oriented x3  Psych: Normal affect, dress and demeanor. Normal speech. Normal thought content and judgment.  ASSESSMENT AND PLAN: Newton Frutiger is a 39 y.o. male present for  Chronic Abdominal pain, RLQ- worsening/Right lower quadrant abdominal mass: - now greater than 12 mos of RLQ/pelvic abd pain. Worsening with lifting and mass palpated.  Unfortunately ultrasound did not resolve and evidence of a hernia.  Possibly false negative secondary to body habitus.  Discussed small lymph node found on ultrasound however mass is tender and treated with abdominal pressure only.  Unlikely causes the lymph node.  Patient desires further evaluation. - CT Abdomen Pelvis W Contrast; Future -Patient will be called with results and further plan may depending upon those results.  May need referral to surgery versus gastroenterology.    30, DO  04/18/2019

## 2019-04-19 ENCOUNTER — Telehealth: Payer: Self-pay

## 2019-04-19 DIAGNOSIS — R1031 Right lower quadrant pain: Secondary | ICD-10-CM

## 2019-04-19 DIAGNOSIS — R1903 Right lower quadrant abdominal swelling, mass and lump: Secondary | ICD-10-CM

## 2019-04-19 NOTE — Telephone Encounter (Signed)
Left message for patient that his insurance has been contacted for approval of CT scan. [facility] physicians will have to review notes prior to approval. Notes have been faxed.

## 2019-04-25 NOTE — Telephone Encounter (Signed)
Per [place], CT scan has been denied for patient. Peer review may be done if needed. Please advise

## 2019-04-26 NOTE — Telephone Encounter (Signed)
He is established with GI.  He can certainly try and call to get in for his abdominal discomfort.  We can also place the referral with urgency to his GI for abdominal pain.  If he elects for [place] to place referral, please place this for him.

## 2019-04-26 NOTE — Telephone Encounter (Signed)
Pt was called and information was given. He states he going to try and call the insurance company also.   Diane/Dana please try to submit this again ASAP  Pt states since being at work this week he feels pain is getting a little worse than before and just uncomfortable. Patient is wondering if he should go ahead and at least make appt with GI since it could take time to get approval and on the schedule.   Please advise

## 2019-04-26 NOTE — Telephone Encounter (Signed)
Please have Nicholas Anthony resubmit CT to his insurance.  There is no reason why a person with  abdominal pain, small abdominal mass that needs a CT scan in order to to rule out herniation would not be approved.  If it is not approved, then we will need to refer to gastroenterology to further evaluate his abdominal pain.  Please advise patient that his insurance company denied the CT but we will try to resubmit.  If they deny again we will need to refer to gastroenterology

## 2019-04-27 NOTE — Addendum Note (Signed)
Addended by: Daryel November L on: 04/27/2019 08:23 AM   Modules accepted: Orders

## 2019-04-27 NOTE — Telephone Encounter (Signed)
Pt was called and would like the urgent referral placed for GI while CT scan is trying to be approved after being denied. Pt would like referral placed, this was placed.

## 2019-04-30 ENCOUNTER — Encounter: Payer: Self-pay | Admitting: Family Medicine

## 2019-04-30 ENCOUNTER — Other Ambulatory Visit: Payer: Self-pay

## 2019-04-30 ENCOUNTER — Ambulatory Visit (INDEPENDENT_AMBULATORY_CARE_PROVIDER_SITE_OTHER): Payer: Managed Care, Other (non HMO) | Admitting: Family Medicine

## 2019-04-30 VITALS — BP 133/87 | HR 85 | Temp 98.7°F | Resp 17 | Ht 75.0 in | Wt 343.0 lb

## 2019-04-30 DIAGNOSIS — J4530 Mild persistent asthma, uncomplicated: Secondary | ICD-10-CM

## 2019-04-30 DIAGNOSIS — H6982 Other specified disorders of Eustachian tube, left ear: Secondary | ICD-10-CM | POA: Diagnosis not present

## 2019-04-30 DIAGNOSIS — J4520 Mild intermittent asthma, uncomplicated: Secondary | ICD-10-CM | POA: Insufficient documentation

## 2019-04-30 DIAGNOSIS — H6992 Unspecified Eustachian tube disorder, left ear: Secondary | ICD-10-CM | POA: Insufficient documentation

## 2019-04-30 MED ORDER — ALBUTEROL SULFATE HFA 108 (90 BASE) MCG/ACT IN AERS: 2.0000 | INHALATION_SPRAY | RESPIRATORY_TRACT | 5 refills | Status: DC | PRN

## 2019-04-30 MED ORDER — FLUTICASONE-SALMETEROL 100-50 MCG/DOSE IN AEPB: 1.0000 | INHALATION_SPRAY | Freq: Two times a day (BID) | RESPIRATORY_TRACT | 11 refills | Status: DC

## 2019-04-30 MED ORDER — FLUTICASONE PROPIONATE 50 MCG/ACT NA SUSP: 2.0000 | Freq: Every day | NASAL | 6 refills | Status: DC

## 2019-04-30 NOTE — Progress Notes (Signed)
This visit occurred during the SARS-CoV-2 public health emergency.  Safety protocols were in place, including screening questions prior to the visit, additional usage of staff PPE, and extensive cleaning of exam room while observing appropriate contact time as indicated for disinfecting solutions.    Nicholas Anthony , Jan 27, 1981, 39 y.o., male MRN: [id number] Patient Care Team    Relationship Specialty Notifications Start End  Natalia Leatherwood, DO PCP - General Family Medicine  09/12/17     Chief Complaint  Patient presents with  . Ear Pain    Pt is having left ear pain and drainage.   . Shortness of Breath    Pt is still getting SOB with Exertion and also hard to take a breath in the cold air. Pt has taken two boxes of mucinex.      Subjective: Nicholas Anthony is a 39 y.o. for continue cough and shortness of breath when working outside in the cold. He has a h/o asthma from when he was younger. He use to be on a daily inhaler. He symptoms started after viral illness (likely covid). CXR was normal and pt was treated with steroid and doxy. He also endorses left ear pain of 1 week duration today. He denies fever, chhills ,nausea, vomit, diarrhea, Fisher, chest pain or dizziness. He endorses phlegm production.  He takes zyrtec daily. He uses his albuterol with SOB episodes and it is helpful.   Pt presents for an OV with complaints of right lower pelvic pain greater than 1 year.  He states he had a recent illness in which he had an increased cough and he has had notable increase in discomfort in his right lower pelvic.  He states he feels like he needs to keep pressure over this area when he coughs and he can feel a bulging in this area.  He states that it has become more more uncomfortable.  He has had bowel changes since his sphincterectomy over the summer he does not feel he has had any new bowel changes since increase in discomfort on his lower abdomen.  He does have heavy lifting duties daily with  his job.    Patient also endorses continued wheezing.  He and his wife are ill.  His wife tested positive for Covid and he had symptoms that started on March 15, 2019.  He tested early on and was negative, however his symptoms progressed and he did not get retested.  He will likely did have COVID-19 since his wife was positive.  He was treated with a Z-Pak and prednisone on 03/21/2019.  He states he continues to have an occasional cough and he is still short of breath and feels winded frequently.  He has wheezing.  He is using an albuterol inhaler approximately every 6 hours.  He used his albuterol just 2 hours prior to his appointment today.  Depression screen [facility] 2/9 12/14/2018 05/26/2018 11/25/2017 09/12/2017  Decreased Interest 0 0 0 0  Down, Depressed, Hopeless 0 0 2 0  PHQ - 2 Score 0 0 2 0  Altered sleeping 3 3 0 -  Tired, decreased energy 0 0 0 -  Change in appetite 0 0 0 -  Feeling bad or failure about yourself  0 0 0 -  Trouble concentrating 1 0 3 -  Moving slowly or fidgety/restless 0 0 3 -  Suicidal thoughts 0 0 0 -  PHQ-9 Score 4 3 8  -  Difficult doing work/chores Not difficult at all Not difficult  at all Very difficult -    Allergies  Allergen Reactions  . Penicillins Other (See Comments)    Unsure has not taken since childhood Told he was allergic as a child Told he was allergic as a child    Social History   Social History Narrative   Married, employed,    In college. Currently working as a Games developer.    Drinks caffeine.    Smoke alarm in the home. Wears his seatbelt.    Feels safe in his relationship.    Past Medical History:  Diagnosis Date  . Allergy   . Asthma   . Chicken pox   . 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 SPHINCTEROTOMY  2020   Family History  Problem Relation Age of Onset  . Diabetes Father   . Heart disease Father   . Hyperlipidemia Father   . Hypertension Father   . Drug abuse  Sister   . Alcohol abuse Sister   . Arthritis Paternal Grandmother   . Depression Paternal Grandmother   . Diabetes Paternal Grandmother   . Hyperlipidemia Paternal Grandmother   . Heart disease Paternal Grandmother   . Hypertension Paternal Grandmother   . Arthritis Paternal Grandfather   . Hearing loss Paternal Grandfather   . Heart disease Paternal Grandfather   . Hyperlipidemia Paternal Grandfather   . Hypertension Paternal Grandfather   . Kidney disease Paternal Grandfather   . Drug abuse Brother    Allergies as of 04/30/2019      Reactions   Penicillins Other (See Comments)   Unsure has not taken since childhood Told he was allergic as a child Told he was allergic as a child      Medication List       Accurate as of April 30, 2019  2:35 PM. If you have any questions, ask your nurse or doctor.        STOP taking these medications   doxycycline 100 MG tablet Commonly known as: VIBRA-TABS Stopped by: Felix Pacini, DO   predniSONE 20 MG tablet Commonly known as: DELTASONE Stopped by: Felix Pacini, DO     TAKE these medications   albuterol 108 (90 Base) MCG/ACT inhaler Commonly known as: Ventolin HFA Inhale 2 puffs into the lungs every 4 (four) hours as needed for wheezing or shortness of breath.   FLUoxetine 20 MG tablet Commonly known as: PROZAC Take 2 tablets (40 mg total) by mouth daily.   fluticasone 50 MCG/ACT nasal spray Commonly known as: FLONASE Place 2 sprays into both nostrils daily. Started by: Felix Pacini, DO   Fluticasone-Salmeterol 100-50 MCG/DOSE Aepb Commonly known as: ADVAIR Inhale 1 puff into the lungs 2 (two) times daily. Started by: Felix Pacini, DO   lisinopril 10 MG tablet Commonly known as: ZESTRIL Take 1 tablet (10 mg total) by mouth daily.   triamcinolone cream 0.1 % Commonly known as: KENALOG Apply 1 application topically 2 (two) times daily.   venlafaxine XR 75 MG 24 hr capsule Commonly known as: EFFEXOR-XR Take 3  capsules (225 mg total) by mouth daily with breakfast.       All past medical history, surgical history, allergies, family history, immunizations andmedications were updated in the EMR today and reviewed under the history and medication portions of their EMR.     ROS: Negative, with the exception of above mentioned in HPI   Objective:  BP 133/87 (BP Location: Left Arm, Patient Position: Sitting, Cuff Size:  Large)   Pulse 85   Temp 98.7 F (37.1 C) (Temporal)   Resp 17   Ht 6\' 3"  (1.905 m)   Wt (!) 343 lb (155.6 kg)   SpO2 96%   BMI 42.87 kg/m  Body mass index is 42.87 kg/m. Gen: Afebrile. No acute distress.  HENT: AT. [place]. Bilateral TM visualized and normal in appearance, mild bilateral fluid. MMM. Bilateral nares WNL. Throat without erythema or exudates. No cough or shortness of breath on exam.  Eyes:Pupils Equal Round Reactive to light, Extraocular movements intact,  Conjunctiva without redness, discharge or icterus. Neck/lymp/endocrine: Supple,no lymphadenopathy CV: RRR no murmur, no edema.  Chest: CTAB, no wheeze or crackles Neuro:  Normal gait. PERLA. EOMi. Alert. Oriented x3   No exam data present No results found. No results found for this or any previous visit (from the past 24 hour(s)).  Assessment/Plan: Nicholas Anthony is a 39 y.o. male present for OV for  RAD/asthma: Exam today is normal. Suspect his symptoms are asthma/RAD related during cold weather and/or s/p COVID (likely- his wife was + he had sx). - CXR during initial visit was normal.  - start advair BID - continue zyrtec - continue albuterol PRN and 1-2 puffs prior to working in the cold.  - if sx remain after 2 weeks he is to call in and we will start Singulair as well, then schedule follow up 4 weeks after starting Singulair. If sx controlled f/o w/ [facility] appts .  Eustachian tube dysfunction:  Bilateral eustachian tube dysfunction. No signs of infections. Demonstrated Galbreath maneuver.  Start flonase  nasal spray daily.     Reviewed expectations re: course of current medical issues.  Discussed self-management of symptoms.  Outlined signs and symptoms indicating need for more acute intervention.  Patient verbalized understanding and all questions were answered.  Patient received an After-Visit Summary.    No orders of the defined types were placed in this encounter.  Meds ordered this encounter  Medications  . Fluticasone-Salmeterol (ADVAIR) 100-50 MCG/DOSE AEPB    Sig: Inhale 1 puff into the lungs 2 (two) times daily.    Dispense:  1 each    Refill:  11  . albuterol (VENTOLIN HFA) 108 (90 Base) MCG/ACT inhaler    Sig: Inhale 2 puffs into the lungs every 4 (four) hours as needed for wheezing or shortness of breath.    Dispense:  18 g    Refill:  5  . fluticasone (FLONASE) 50 MCG/ACT nasal spray    Sig: Place 2 sprays into both nostrils daily.    Dispense:  16 g    Refill:  6     Note is dictated utilizing voice recognition software. Although note has been proof read prior to signing, occasional typographical errors still can be missed. If any questions arise, please do not hesitate to call for verification.   electronically signed by:  Howard Pouch, DO  [place]

## 2019-04-30 NOTE — Patient Instructions (Addendum)
Start: - flonase nasal spray 1 spray each side of nose daily.  - advair 1 inhalation twice a day - continue zyrtec.  - use albuterol if needed for chest tightness and 2 puffs prior to working out in the cold.    If still feeling asthma like symptoms after 2 weeks, call in and we would also start Singulair pill.   Asthma, Adult  Asthma is a long-term (chronic) condition in which the airways get tight and narrow. The airways are the breathing passages that lead from the nose and mouth down into the lungs. A person with asthma will have times when symptoms get worse. These are called asthma attacks. They can cause coughing, whistling sounds when you breathe (wheezing), shortness of breath, and chest pain. They can make it hard to breathe. There is no cure for asthma, but medicines and lifestyle changes can help control it. There are many things that can bring on an asthma attack or make asthma symptoms worse (triggers). Common triggers include:  Mold.  Dust.  Cigarette smoke.  Cockroaches.  Things that can cause allergy symptoms (allergens). These include animal skin flakes (dander) and pollen from trees or grass.  Things that pollute the air. These may include household cleaners, wood smoke, smog, or chemical odors.  Cold air, weather changes, and wind.  Crying or laughing hard.  Stress.  Certain medicines or drugs.  Certain foods such as dried fruit, potato chips, and grape juice.  Infections, such as a cold or the flu.  Certain medical conditions or diseases.  Exercise or tiring activities. Asthma may be treated with medicines and by staying away from the things that cause asthma attacks. Types of medicines may include:  Controller medicines. These help prevent asthma symptoms. They are usually taken every day.  Fast-acting reliever or rescue medicines. These quickly relieve asthma symptoms. They are used as needed and provide short-term relief.  Allergy medicines if  your attacks are brought on by allergens.  Medicines to help control the body's defense (immune) system. Follow these instructions at home: Avoiding triggers in your home  Change your heating and air conditioning filter often.  Limit your use of fireplaces and wood stoves.  Get rid of pests (such as roaches and mice) and their droppings.  Throw away plants if you see mold on them.  Clean your floors. Dust regularly. Use cleaning products that do not smell.  Have someone vacuum when you are not home. Use a vacuum cleaner with a HEPA filter if possible.  Replace carpet with wood, tile, or vinyl flooring. Carpet can trap animal skin flakes and dust.  Use allergy-proof pillows, mattress covers, and box spring covers.  Wash bed sheets and blankets every week in hot water. Dry them in a dryer.  Keep your bedroom free of any triggers.  Avoid pets and keep windows closed when things that cause allergy symptoms are in the air.  Use blankets that are made of polyester or cotton.  Clean bathrooms and kitchens with bleach. If possible, have someone repaint the walls in these rooms with mold-resistant paint. Keep out of the rooms that are being cleaned and painted.  Wash your hands often with soap and water. If soap and water are not available, use hand sanitizer.  Do not allow anyone to smoke in your home. General instructions  Take over-the-counter and prescription medicines only as told by your doctor. ? Talk with your doctor if you have questions about how or when to take your medicines. ?  Make note if you need to use your medicines more often than usual.  Do not use any products that contain nicotine or tobacco, such as cigarettes and e-cigarettes. If you need help quitting, ask your doctor.  Stay away from secondhand smoke.  Avoid doing things outdoors when allergen counts are high and when air quality is low.  Wear a ski mask when doing outdoor activities in the winter. The  mask should cover your nose and mouth. Exercise indoors on cold days if you can.  Warm up before you exercise. Take time to cool down after exercise.  Use a peak flow meter as told by your doctor. A peak flow meter is a tool that measures how well the lungs are working.  Keep track of the peak flow meter's readings. Write them down.  Follow your asthma action plan. This is a written plan for taking care of your asthma and treating your attacks.  Make sure you get all the shots (vaccines) that your doctor recommends. Ask your doctor about a flu shot and a pneumonia shot.  Keep all follow-up visits as told by your doctor. This is important. Contact a doctor if:  You have wheezing, shortness of breath, or a cough even while taking medicine to prevent attacks.  The mucus you cough up (sputum) is thicker than usual.  The mucus you cough up changes from clear or white to yellow, green, gray, or bloody.  You have problems from the medicine you are taking, such as: ? A rash. ? Itching. ? Swelling. ? Trouble breathing.  You need reliever medicines more than 2-3 times a week.  Your peak flow reading is still at 50-79% of your personal best after following the action plan for 1 hour.  You have a fever. Get help right away if:  You seem to be worse and are not responding to medicine during an asthma attack.  You are short of breath even at rest.  You get short of breath when doing very little activity.  You have trouble eating, drinking, or talking.  You have chest pain or tightness.  You have a fast heartbeat.  Your lips or fingernails start to turn blue.  You are light-headed or dizzy, or you faint.  Your peak flow is less than 50% of your personal best.  You feel too tired to breathe normally. Summary  Asthma is a long-term (chronic) condition in which the airways get tight and narrow. An asthma attack can make it hard to breathe.  Asthma cannot be cured, but medicines  and lifestyle changes can help control it.  Make sure you understand how to avoid triggers and how and when to use your medicines. This information is not intended to replace advice given to you by your health care provider. Make sure you discuss any questions you have with your health care provider. Document Revised: 05/11/2018 Document Reviewed: 04/12/2016 Elsevier Patient Education  2020 [employer].

## 2019-05-01 ENCOUNTER — Ambulatory Visit (INDEPENDENT_AMBULATORY_CARE_PROVIDER_SITE_OTHER): Payer: Managed Care, Other (non HMO) | Admitting: Gastroenterology

## 2019-05-01 ENCOUNTER — Encounter: Payer: Self-pay | Admitting: Gastroenterology

## 2019-05-01 VITALS — BP 116/80 | HR 104 | Temp 97.8°F | Ht 73.75 in | Wt 341.4 lb

## 2019-05-01 DIAGNOSIS — K4090 Unilateral inguinal hernia, without obstruction or gangrene, not specified as recurrent: Secondary | ICD-10-CM | POA: Diagnosis not present

## 2019-05-01 NOTE — Telephone Encounter (Signed)
Patient has been scheduled by [facility] for today.

## 2019-05-01 NOTE — Patient Instructions (Addendum)
  If you are age 38 or older, your body mass index should be between 23-30. Your Body mass index is 44.13 kg/m. If this is out of the aforementioned range listed, please consider follow up with your Primary Care Provider.  If you are age 58 or younger, your body mass index should be between 19-25. Your Body mass index is 44.13 kg/m. If this is out of the aformentioned range listed, please consider follow up with your Primary Care Provider.   You have been scheduled for an appointment with at [facility]. Make certain to bring a list of current medications, including any over the counter medications or vitamins. Also bring your co-pay if you have one as well as your insurance cards. [facility] is located at [address]. Please contact them at [phone] with any questions or concerns.  Thank you, Dr Christella Hartigan

## 2019-05-01 NOTE — Progress Notes (Signed)
HPI: This is a very pleasant 39 year old man who was referred to me by Felix Pacini A, DO  to evaluate right groin discomfort.    For 3 to 4 months he has had discomfort in his right groin and a bulge at the site with Valsalva.  He can feel this and it is uncomfortable.  It was a lot worse when he was coughing quite a bit with Covid infection.  It is a bit tender and uncomfortable always lately.  No fevers or chills  The pain is somewhat related to moving his bowels and straining to move his bowels especially.  The pain is not related to urinating.  No overt GI bleeding, no overt urinary bleeding  Old Data Reviewed: Blood work September 2020 shows normal CBC and normal complete metabolic profile  Pelvic ultrasound January 2021 done for "right inguinal pain" was normal; targeted to the right groin only  Review of systems: Pertinent positive and negative review of systems were noted in the above HPI section. All other review negative.   Past Medical History:  Diagnosis Date  . Allergy   . Anal fissure   . Asthma   . Chicken pox   . HTN (hypertension)   . 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 SPHINCTEROTOMY  2020    Current Outpatient Medications  Medication Sig Dispense Refill  . albuterol (VENTOLIN HFA) 108 (90 Base) MCG/ACT inhaler Inhale 2 puffs into the lungs every 4 (four) hours as needed for wheezing or shortness of breath. 18 g 5  . FLUoxetine (PROZAC) 20 MG tablet Take 2 tablets (40 mg total) by mouth daily. 60 tablet 5  . fluticasone (FLONASE) 50 MCG/ACT nasal spray Place 2 sprays into both nostrils daily. 16 g 6  . Fluticasone-Salmeterol (ADVAIR) 100-50 MCG/DOSE AEPB Inhale 1 puff into the lungs 2 (two) times daily. 1 each 11  . lisinopril (ZESTRIL) 10 MG tablet Take 1 tablet (10 mg total) by mouth daily. 30 tablet 5  . triamcinolone cream (KENALOG) 0.1 % Apply 1 application topically 2 (two) times daily. 30 g 2   . venlafaxine XR (EFFEXOR-XR) 75 MG 24 hr capsule Take 3 capsules (225 mg total) by mouth daily with breakfast. 90 capsule 5   No current facility-administered medications for this visit.    Allergies as of 05/01/2019 - Review Complete 05/01/2019  Allergen Reaction Noted  . Penicillins Other (See Comments) 05/11/2013    Family History  Problem Relation Age of Onset  . Diabetes Father   . Heart disease Father   . Hyperlipidemia Father   . Hypertension Father   . Drug abuse Sister   . Alcohol abuse Sister   . Arthritis Paternal Grandmother   . Depression Paternal Grandmother   . Diabetes Paternal Grandmother   . Hyperlipidemia Paternal Grandmother   . Heart disease Paternal Grandmother   . Hypertension Paternal Grandmother   . Arthritis Paternal Grandfather   . Hearing loss Paternal Grandfather   . Heart disease Paternal Grandfather   . Hyperlipidemia Paternal Grandfather   . Hypertension Paternal Grandfather   . Kidney disease Paternal Grandfather   . Drug abuse Brother     Social History   Socioeconomic History  . Marital status: Married    Spouse name: Not on file  . Number of children: 2  . Years of education: Not on file  . Highest education level: Not on file  Occupational History  . Occupation: deisel  mechanic  Tobacco Use  . Smoking status: Never Smoker  . Smokeless tobacco: Never Used  Substance and Sexual Activity  . Alcohol use: Never  . Drug use: Never  . Sexual activity: Yes    Partners: Female  Other Topics Concern  . 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    Drinks caffeine.    Smoke alarm in the home. Wears his seatbelt.    Feels safe in his relationship.    Social Determinants of Health   Financial Resource Strain:   . Difficulty of Paying Living Expenses: Not on file  Food Insecurity:   . Worried About Programme researcher, broadcasting/film/video in the Last Year: Not on file  . Ran  Out of Food in the Last Year: Not on file  Transportation Needs:   . Lack of Transportation (Medical): Not on file  . Lack of Transportation (Non-Medical): Not on file  Physical Activity:   . Days of Exercise per Week: Not on file  . Minutes of Exercise per Session: Not on file  Stress:   . Feeling of Stress : Not on file  Social Connections:   . Frequency of Communication with Friends and Family: Not on file  . Frequency of Social Gatherings with Friends and Family: Not on file  . Attends Religious Services: Not on file  . Active Member of Clubs or Organizations: Not on file  . Attends Banker Meetings: Not on file  . Marital Status: Not on file  Intimate Partner Violence:   . Fear of Current or Ex-Partner: Not on file  . Emotionally Abused: Not on file  . Physically Abused: Not on file  . Sexually Abused: Not on file     Physical Exam: BP 116/80 (BP Location: Left Arm, Patient Position: Sitting, Cuff Size: Large)   Pulse (!) 104   Temp 97.8 F (36.6 C)   Ht 6' 1.75" (1.873 m) Comment: height measured without shoes  Wt (!) 341 lb 6 oz (154.8 kg)   BMI 44.13 kg/m  Constitutional: generally well-appearing, morbid obesity Psychiatric: alert and oriented x3 Eyes: extraocular movements intact Mouth: oral pharynx moist, no lesions Neck: supple no lymphadenopathy Cardiovascular: heart regular rate and rhythm Lungs: clear to auscultation bilaterally Abdomen: soft, nontender, nondistended, no obvious ascites, no peritoneal signs, normal bowel sounds Extremities: no lower extremity edema bilaterally Skin: no lesions on visible extremities Groin: There is a bulging outward in his right groin to palpation with Valsalva.  This is about 2 or 3 cm across.  I am not sure if this is inguinal or femoral however I suspect that is a femoral hernia  Assessment and plan: 39 y.o. 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  I favor femoral actually.  He clearly has a bulge with Valsalva and is uncomfortable at this spot.  Coughing has made it worse.  He has a job that requires a lot of heavy lifting so I think it is probably a good idea that this be repaired.  We will arrange for referral to [facility] surgery.  I cannot explain why this was not noticeable by ultrasound but perhaps it would have been more noticeable if that was with Valsalva.   Please see the "Patient Instructions" section for addition  details about the plan.   Owens Loffler, MD [facility] 05/01/2019, 3:10 PM  Cc: Howard Pouch A, DO  Total time on date of encounter was 30 minutes (this included time spent preparing to see the patient reviewing records; obtaining and/or reviewing separately obtained history; performing a medically appropriate exam and/or evaluation; counseling and educating the patient and family if present; ordering medications, tests or procedures if applicable; and documenting clinical information in the health record).

## 2019-05-21 ENCOUNTER — Ambulatory Visit (HOSPITAL_COMMUNITY): Payer: Self-pay | Admitting: Surgery

## 2019-05-21 DIAGNOSIS — K4020 Bilateral inguinal hernia, without obstruction or gangrene, not specified as recurrent: Secondary | ICD-10-CM | POA: Insufficient documentation

## 2019-05-21 HISTORY — DX: Bilateral inguinal hernia, without obstruction or gangrene, not specified as recurrent: K40.20

## 2019-05-21 NOTE — H&P (Signed)
Nicholas Anthony Documented: 05/21/2019 9:37 AM Location: [facility] Patient #: [id number] DOB: 07/13/1979 Single / Language: [place] / Race: Black or African American Male   History of Present Illness Ardeth Sportsman MD; 05/21/2019 10:38 AM) The patient is a 39 year old male who presents with anal fistula. Note for "Anal fistula": ` ` ` Patient sent for surgical consultation at the request of [facility] MD& Erlinda Hong, MD [facility] Internal Medicine  Chief Complaint: Persistent anal wound and drainage. 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 Dr. Dwain Sarna with our group took him to the operating room. Excised some prolapsing rectal tissue for biopsy as well as excise this chronic left anterior or mass. Cannot definitely prove a fistula. Not consistent with a polyp. Benign. Patient had some recurrent drainage. Discussion made about reexamination in September 2012 to rule out an anal fistula.. The patient held off on surgery. Then the patient called to request an appointment in July 2013. [facility] reached out to try and get this scheduled. Again reached out in September 2013. There is no records of seeing him in our office again.  Apparently the patient had relocated to [facility]. Saw Dr Sharyl Nimrod with surgery at [facility] in 2017. Discussion about colonoscopy and probable examination under anesthesia and seton placement to help sort things out. Again it did not happen. He ended up getting admitted for depression and other psychiatric and polysubstance issues around that time.  The patient nots he is still struggling with drainage. He has a lot of rectal urgency and claims he goes "all the time". No major bleeding. Seen Dr. Orvan Falconer with infectious disease in town here. He had a relapse in viral load in 2018 but for the past 2 years he's had undetectable HIV RNA. Patient has never had a  colonoscopy. He had an umbilical hernia repair when he was young by Dr. Etheleen Mayhew who has since retired. Patient notes he would get sharp anorectal pain. We'll try and take ibuprofen. We'll sometimes drink a few shots of vodka surgery could finally sleep. He notes the pain will radiate to his tailbone and occasionally pulls on his scrotum as well.  No personal nor family history of GI/colon cancer, inflammatory bowel disease, irritable bowel syndrome, allergy such as Celiac Sprue, dietary/dairy problems, colitis, ulcers nor gastritis. No recent sick contacts/gastroenteritis. No travel outside the country. No changes in diet. No dysphagia to solids or liquids. No significant heartburn or reflux. No hematochezia, hematemesis, coffee ground emesis. No evidence of prior gastric/peptic ulceration.  (Review of systems as stated in this history (HPI) or in the review of systems. Otherwise all other 12 point ROS are negative) ` ` `  This patient encounter took 55 minutes today to perform the following: obtain history, perform exam, review outside records, interpret tests & imaging, counsel the patient on their diagnosis; and, document this encounter, including findings & plan in the electronic health record (EHR).     [place] [address] [place] , [place] [address] Telephone : [phone] Fax : [phone]  REPORT OF SURGICAL PATHOLOGY  Accession #: [id number] Patient Name: Nicholas Anthony Visit # : [id number]  MRN: [id number] Physician: Emelia Loron DOB/Age 67/24/1981 (Age: 53) Gender: M Collected Date: 03/27/2010 Received Date: 03/27/2010  FINAL DIAGNOSIS  1. Rectum, polyp(s), : - POLYPOID FRAGMENTS OF BENIGN COLONIC MUCOSA WITH PROMINENT PROLAPSE CHANGES. - NO ADENOMATOUS  CHANGES OR MALIGNANCY IDENTIFIED.  2. Soft tissue mass, simple excision, left buttock lesion : - SKIN AND SUBCUTANEOUS TISSUE WITH ASSOCIATED ACUTE INFLAMMATION  AND ASSOCIATED FOREIGN BODY GIANT CELL REACTIONS, NO ATYPIA OR MALIGNANCY.  ELECTRONIC SIGNATURE : Brooks Sailors, H. Barnetta Chapel, Industrial/product designer, Electronic Signature  MICROSCOPIC DESCRIPTION  CASE COMMENTS  CLINICAL HISTORY  SPECIMEN(S) OBTAINED 1. Rectum, polyp(s), 2. Soft tissue mass, simple excision, Left Buttock Lesion  SPECIMEN COMMENTS: SPECIMEN CLINICAL INFORMATION: 1. Left buttock lesion (ms)  Arland Usery Description 1. Received in formalin is a 0.7 x 0.6 x 0.5 cm soft, tan red mucosal polyp.The base is inked, and the polyp is bisected and entirely submitted in one cassette. 2. Received in formalin is a 3.5 x 1.7 cm portion of tan gray skin and subcutaneous tissue excised to a depth of up to 1.5 cm.The skin surface shows focal, Anthony defined thickening.There is dense subcutaneous fibrosis surrounding a fistula tract.There are strands of hair present in the fistula tract.Sections are submitted in one cassette.GP/eps 03/27/10  Report signed out from the following location(s) [place] [address], [place] [address] CLIA #: [id number]  [facility] [address] [place], [place] [address] CLIA #: [id number]   Past Surgical History ([place], [place]; 05/21/2019 9:37 AM) Anal Fissure Repair   Diagnostic Studies History ([place], CMA; 05/21/2019 9:37 AM) Colonoscopy  never  Allergies [facility] Teressa Senter, CMA; 05/21/2019 9:37 AM) Penicillins  Allergies Reconciled   Medication History (Chanel Teressa Senter, CMA; 05/21/2019 9:38 AM) Jorje Guild (150-150-200-10MG  Tablet, Oral) Active. amLODIPine Besylate (5MG  Tablet, Oral) Active. Lisinopril (10MG  Tablet, Oral) Active. Medications Reconciled  Social History Antonietta Jewel, CMA; 05/21/2019 9:37 AM) Alcohol use  Moderate alcohol use. Illicit drug use  Uses socially only. No caffeine use  Tobacco use  Current some day smoker.  Family History Antonietta Jewel, [place]; 05/21/2019 9:37 AM) Diabetes Mellitus   Father. Heart disease in male family member before age 1  Hypertension  Father.  Other Problems (Chanel Teressa Senter, CMA; 05/21/2019 9:37 AM) Back Pain  HIV-positive  Hypercholesterolemia     Review of Systems (Chanel Nolan CMA; 05/21/2019 9:37 AM) General Not Present- Appetite Loss, Chills, Fatigue, Fever, Night Sweats, Weight Gain and Weight Loss. Skin Present- New Lesions and Non-Healing Wounds. Not Present- Change in Wart/Mole, Dryness, Hives, Jaundice, Rash and Ulcer. HEENT Present- Wears glasses/contact lenses. Not Present- Earache, Hearing Loss, Hoarseness, Nose Bleed, Oral Ulcers, Ringing in the Ears, Seasonal Allergies, Sinus Pain, Sore Throat, Visual Disturbances and Yellow Eyes. Respiratory Not Present- Bloody sputum, Chronic Cough, Difficulty Breathing, Snoring and Wheezing. Breast Not Present- Breast Mass, Breast Pain, Nipple Discharge and Skin Changes. Cardiovascular Not Present- Chest Pain, Difficulty Breathing Lying Down, Leg Cramps, Palpitations, Rapid Heart Rate, Shortness of Breath and Swelling of Extremities. Gastrointestinal Not Present- Abdominal Pain, Bloating, Bloody Stool, Change in Bowel Habits, Chronic diarrhea, Constipation, Difficulty Swallowing, Excessive gas, Gets full quickly at meals, Hemorrhoids, Indigestion, Nausea, Rectal Pain and Vomiting. Male Genitourinary Not Present- Blood in Urine, Change in Urinary Stream, Frequency, Impotence, Nocturia, Painful Urination, Urgency and Urine Leakage. Musculoskeletal Present- Back Pain. Not Present- Joint Pain, Joint Stiffness, Muscle Pain, Muscle Weakness and Swelling of Extremities. Neurological Not Present- Decreased Memory, Fainting, Headaches, Numbness, Seizures, Tingling, Tremor, Trouble walking and Weakness. Psychiatric Not Present- Anxiety, Bipolar, Change in Sleep Pattern, Depression, Fearful and Frequent crying. Endocrine Not Present- Cold Intolerance, Excessive Hunger, Hair Changes, Heat Intolerance, Hot  flashes and New Diabetes. Hematology Present- HIV. Not Present- Blood Thinners, Easy Bruising,  Excessive bleeding, Gland problems and Persistent Infections.  Vitals (Chanel Nolan CMA; 05/21/2019 9:38 AM) 05/21/2019 9:38 AM Weight: 180 lb Height: 69in Body Surface Area: 1.98 m Body Mass Index: 26.58 kg/m  Temp.: 96.18F  Pulse: 91 (Regular)  BP: 128/76 (Sitting, Left Arm, Standard)       Physical Exam Ardeth Sportsman MD; 05/21/2019 10:10 AM) General Mental Status-Alert. General Appearance-Not in acute distress, Not Sickly. Orientation-Oriented X3. Hydration-Well hydrated. Voice-Normal.  Integumentary Global Assessment Upon inspection and palpation of skin surfaces of the - Axillae: non-tender, no inflammation or ulceration, no drainage. and Distribution of scalp and body hair is normal. General Characteristics Temperature - normal warmth is noted.  Head and Neck Head-normocephalic, atraumatic with no lesions or palpable masses. Face Global Assessment - atraumatic, no absence of expression. Neck Global Assessment - no abnormal movements, no bruit auscultated on the right, no bruit auscultated on the left, no decreased range of motion, non-tender. Trachea-midline. Thyroid Gland Characteristics - non-tender.  Eye Eyeball - Left-Extraocular movements intact, No Nystagmus - Left. Eyeball - Right-Extraocular movements intact, No Nystagmus - Right. Cornea - Left-No Hazy - Left. Cornea - Right-No Hazy - Right. Sclera/Conjunctiva - Left-No scleral icterus, No Discharge - Left. Sclera/Conjunctiva - Right-No scleral icterus, No Discharge - Right. Pupil - Left-Direct reaction to light normal. Pupil - Right-Direct reaction to light normal.  ENMT Ears Pinna - Left - no drainage observed, no generalized tenderness observed. Pinna - Right - no drainage observed, no generalized tenderness observed. Nose and Sinuses External Inspection of the  Nose - no destructive lesion observed. Inspection of the nares - Left - quiet respiration. Inspection of the nares - Right - quiet respiration. Mouth and Throat Lips - Upper Lip - no fissures observed, no pallor noted. Lower Lip - no fissures observed, no pallor noted. Nasopharynx - no discharge present. Oral Cavity/Oropharynx - Tongue - no dryness observed. Oral Mucosa - no cyanosis observed. Hypopharynx - no evidence of airway distress observed.  Chest and Lung Exam Inspection Movements - Normal and Symmetrical. Accessory muscles - No use of accessory muscles in breathing. Palpation Palpation of the chest reveals - Non-tender. Auscultation Breath sounds - Normal and Clear.  Cardiovascular Auscultation Rhythm - Regular. Murmurs & Other Heart Sounds - Auscultation of the heart reveals - No Murmurs and No Systolic Clicks.  Abdomen Inspection Inspection of the abdomen reveals - No Visible peristalsis and No Abnormal pulsations. Umbilicus - No Bleeding, No Urine drainage. Palpation/Percussion Palpation and Percussion of the abdomen reveal - Soft, Non Tender, No Rebound tenderness, No Rigidity (guarding) and No Cutaneous hyperesthesia. Note: Abdomen soft. Nontender. Not distended. Moderate suprapubic umbilical diastases recti. Flat umbilicus but no recurrent hernia No umbilical or incisional hernias. No guarding.   Male Genitourinary Sexual Maturity Tanner 5 - Adult hair pattern and Adult penile size and shape. Note: No inguinal hernias. Normal external genitalia. Epididymi, testes, and spermatic cords normal without any masses.   Rectal Note: Left anterior hyperpigmented stellate scar with central drainage 5x4cm region concerning for chronic perirectal fistula. No definite ulceration but concerning.  Right anterior old scarring with punctate purulent drainage as well.  Otherwise perianal skin clear with no obvious condyloma, external hemorrhoid, fissure. Tolerates digital and  anoscopic exam. Area of some mild narrowing at the anorectal junction but can tolerate digital and anoscopic examination.. Hypertrophic and inflamed anal crypts anterior circumference with some atypical but soft rectal mucosa. Suspect this is more chronic irritation/proctitis and not really condyloma/adenomatous polyp.   Peripheral Vascular  Upper Extremity Inspection - Left - No Cyanotic nailbeds - Left, Not Ischemic. Inspection - Right - No Cyanotic nailbeds - Right, Not Ischemic.  Neurologic Neurologic evaluation reveals -normal attention span and ability to concentrate, able to name objects and repeat phrases. Appropriate fund of knowledge , normal sensation and normal coordination. Mental Status Affect - not angry, not paranoid. Cranial Nerves-Normal Bilaterally. Gait-Normal.  Neuropsychiatric Mental status exam performed with findings of-able to articulate well with normal speech/language, rate, volume and coherence, thought content normal with ability to perform basic computations and apply abstract reasoning and no evidence of hallucinations, delusions, obsessions or homicidal/suicidal ideation.  Musculoskeletal Global Assessment Spine, Ribs and Pelvis - no instability, subluxation or laxity. Right Upper Extremity - no instability, subluxation or laxity.  Lymphatic Head & Neck  General Head & Neck Lymphatics: Bilateral - Description - No Localized lymphadenopathy. Axillary  General Axillary Region: Bilateral - Description - No Localized lymphadenopathy. Femoral & Inguinal  Generalized Femoral & Inguinal Lymphatics: Left - Description - No Localized lymphadenopathy. Right - Description - No Localized lymphadenopathy.    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 Prior rectal and biopsy 2012 showing no  polyp, cancer, inflammatory bowel disease, tumor. CT scan in 2017 showing chronic fistulous tract with no undrained abscess. A CT scan in 2019 noncontrasted not showing any obvious mass or tumor.  I had a long discussion with the patient. I think he would benefit from examination under anesthesia. Most likely excision of chronic pararectal tissue. Placement of seton to try and get this under better control. Left anterior definite. Right anterior possible as well. Then rule out inflammatory bowel disease, cancer, etc. Get him to quit smoking. Colonoscopy to rule out inflammatory bowel disease or other concerns. We will try and coordinate the case with [facility] gastroenterology versus timing later.  Then 2nd surgery for more definitive repair. Possible LIFT repair versus internal sphincterotomy if it is an intersphincteric fistula versus advancement flap if more proximal, etc. We will see.  I concern with him not following through on surgical recommendations again, but the patient claims he is motivated. He did note he wanted to try and delay things until late April when he had more of a break from work to recover. We will try and help facilitate things and hopefully help get him through this chronic frustrating problem Current Plans Pt Education - [facility] Abscess/Fistula (AT): discussed with patient and provided information. The anatomy & physiology of the anorectal region was discussed. We discussed the pathophysiology of anorectal abscess and fistula. Differential diagnosis was discussed. Natural history progression was discussed. I stressed the importance of a bowel regimen to have daily soft bowel movements to minimize progression of disease.  The patient's condition is not adequately controlled. Non-operative treatment has not healed the fistula. Therefore, I recommended examination under anaesthesia to confirm the diagnosis and treat the fistula. I discussed techniques that may be required such  as fistulotomy, ligation by LIFT technique, and/or seton placement. Benefits & alternatives discussed. I noted a good likelihood this will help address the problem, but sometimes repeat operations and prolonged healing times may occur. Risks such as bleeding, pain, recurrence, reoperation, incontinence, heart attack, death, and other risks were discussed.  Educational handouts further explaining the pathology, treatment options, and bowel regimen were given. The patient expressed understanding & wishes to proceed. We will work to coordinate  surgery for a mutually convenient time.  You are being scheduled for surgery- Our schedulers will call you.  You should hear from our office's scheduling department within 5 working days about the location, date, and time of surgery. We try to make accommodations for patient's preferences in scheduling surgery, but sometimes the OR schedule or the surgeon's schedule prevents [place] from making those accommodations.  If you have not heard from our office [phone]) in 5 working days, call the office and ask for your surgeon's nurse.  If you have other questions about your diagnosis, plan, or surgery, call the office and ask for your surgeon's nurse.  ANOSCOPY, DIAGNOSTIC (54270) Instructed to schedule colonoscopywith a gastroenterologist ENCOUNTER FOR PREOPERATIVE EXAMINATION FOR GENERAL SURGICAL PROCEDURE (Z01.818) Current Plans Pt Education - [facility] Rectal Prep for Anorectal outpatient/office surgery: discussed with patient and provided information. Pt Education - [facility] Rectal Surgery HCI (Kelten Enochs): discussed with patient and provided information. Pt Education - [facility] Good Bowel Health (Keonia Pasko) TOBACCO ABUSE (Z72.0) Impression: STOP SMOKING!  We talked to the patient about the dangers of smoking. We stressed that tobacco use dramatically increases the risk of peri-operative complications such as infection, tissue necrosis leaving to problems with  incision/wound and organ healing, hernia, chronic pain, heart attack, stroke, DVT, pulmonary embolism, and death. We noted there are programs in our community to help stop smoking. Information was available. Current Plans Pt Education - [facility] STOP SMOKING! HIV POSITIVE (Z21) Impression: Followed by Dr. Cliffton Asters with infectious disease. Undetectable viral load for the last 2 years    Signed electronically by Ardeth Sportsman, MD (05/21/2019 10:41 AM)  Ardeth Sportsman, MD, FACS, MASCRS Gastrointestinal and Minimally Invasive Surgery  [facility] [address], [address] [place], [place] [address] [phone] Main / Paging [phone] Fax

## 2019-06-04 ENCOUNTER — Ambulatory Visit: Payer: Self-pay | Admitting: Surgery

## 2019-06-04 NOTE — H&P (Signed)
Nicholas Anthony Documented: 05/21/2019 8:53 AM Location: [facility] Patient #: [id number] DOB: [birth year] Married / Language: [place] / Race: White Male   History of Present Illness Ardeth Sportsman MD; 05/21/2019 1:18 PM) The patient is a 39 year old male who presents with an inguinal hernia. Note for "Inguinal hernia": ` ` ` Patient sent for surgical consultation at the request of Dr Christella Hartigan  Chief Complaint: Right groin pain. Probable hernia. ` ` The patient is a pleasant gentleman that I did surgery on for an anal fissure & hemorrhoids. He recovered from that last year. He had bad coughing episode and was diagnosed with [facility] around December 2020. He noticed some right groin pain. Persisted. Discuss with his primary care physician. Ultrasound done. No obvious hernia. Sent to gastroenterology to rule out abdominal issues. Inguinal hernia suspected on exam. Surgical consultation requested. He comes in today by himself. He has job with a lot of intense lifting. He gets some mild discomfort but can do most of his work. He moves his bowels once to 3 times a day. No fevers or chills. He does not smoke. He is not a diabetic.  (Review of systems as stated in this history (HPI) or in the review of systems. Otherwise all other 12 point ROS are negative) ` ` `  This patient encounter took 40 minutes today to perform the following: obtain history, perform exam, review outside records, interpret tests & imaging, counsel the patient on their diagnosis; and, document this encounter, including findings & plan in the electronic health record (EHR).   Allergies (Chanel Lonni Fix, CMA; 05/21/2019 8:53 AM) Penicillins  Allergies Reconciled   Medication History (Chanel Lonni Fix, CMA; 05/21/2019 8:54 AM) Albuterol Sulfate HFA (108 (90 Base)MCG/ACT Aerosol Soln, Inhalation) Active. Proctozone-HC (2.5% Cream, External) Active. Venlafaxine HCl ER (225MG  Tablet ER 24HR, Oral)  Active. Lisinopril (10MG  Tablet, Oral) Active. FLUoxetine HCl (20MG  Capsule, Oral) Active. Fluticasone Propionate (50MCG/ACT Suspension, Nasal) Active. Medications Reconciled  Vitals (Chanel Nolan CMA; 05/21/2019 8:54 AM) 05/21/2019 8:54 AM Weight: 339.13 lb Height: 75in Body Surface Area: 2.75 m Body Mass Index: 42.39 kg/m  Temp.: 97.37F  Pulse: 105 (Regular)  BP: 118/70 (Sitting, Left Arm, Standard)       Physical Exam MD; 05/21/2019 9:30 AM) General Mental Status-Alert. General Appearance-Not in acute distress, Not Sickly. Orientation-Oriented X3. Hydration-Well hydrated. Voice-Normal.  Integumentary Global Assessment Upon inspection and palpation of skin surfaces of the - Axillae: non-tender, no inflammation or ulceration, no drainage. and Distribution of scalp and body hair is normal. General Characteristics Temperature - normal warmth is noted.  Head and Neck Head-normocephalic, atraumatic with no lesions or palpable masses. Face Global Assessment - atraumatic, no absence of expression. Neck Global Assessment - no abnormal movements, no bruit auscultated on the right, no bruit auscultated on the left, no decreased range of motion, non-tender. Trachea-midline. Thyroid Gland Characteristics - non-tender.  Eye Eyeball - Left-Extraocular movements intact, No Nystagmus - Left. Eyeball - Right-Extraocular movements intact, No Nystagmus - Right. Cornea - Left-No Hazy - Left. Cornea - Right-No Hazy - Right. Sclera/Conjunctiva - Left-No scleral icterus, No Discharge - Left. Sclera/Conjunctiva - Right-No scleral icterus, No Discharge - Right. Pupil - Left-Direct reaction to light normal. Pupil - Right-Direct reaction to light normal.  ENMT Ears Pinna - Left - no drainage observed, no generalized tenderness observed. Pinna - Right - no drainage observed, no generalized tenderness observed. Nose and  Sinuses External Inspection of the Nose - no destructive lesion  observed. Inspection of the nares - Left - quiet respiration. Inspection of the nares - Right - quiet respiration. Mouth and Throat Lips - Upper Lip - no fissures observed, no pallor noted. Lower Lip - no fissures observed, no pallor noted. Nasopharynx - no discharge present. Oral Cavity/Oropharynx - Tongue - no dryness observed. Oral Mucosa - no cyanosis observed. Hypopharynx - no evidence of airway distress observed.  Chest and Lung Exam Inspection Movements - Normal and Symmetrical. Accessory muscles - No use of accessory muscles in breathing. Palpation Palpation of the chest reveals - Non-tender. Auscultation Breath sounds - Normal and Clear.  Cardiovascular Auscultation Rhythm - Regular. Murmurs & Other Heart Sounds - Auscultation of the heart reveals - No Murmurs and No Systolic Clicks.  Abdomen Inspection Inspection of the abdomen reveals - No Visible peristalsis and No Abnormal pulsations. Umbilicus - No Bleeding, No Urine drainage. Palpation/Percussion Palpation and Percussion of the abdomen reveal - Soft, Non Tender, No Rebound tenderness, No Rigidity (guarding) and No Cutaneous hyperesthesia. Note: Abdomen obese but soft. Nontender. Not distended. Minimal supraumbilical diastases recti. No umbilical or incisional hernias. No guarding.   Male Genitourinary Sexual Maturity Tanner 5 - Adult hair pattern and Adult penile size and shape. Note: Left greater than right inguinal hernias. Right seems more medial / direct. More sensitive. Hyperpigmented skin consistent with h/o intermittent groin rash. No active rash right now. Hygiene good. Otherwise normal external male genitalia.   Rectal Note: Posterior anal midline fissure with very tiny sentinel tag. Increased sphincter tone. No external hemorrhoids or condylomas. Mild moisture in leaking and mild pleuritis. On any digital or anoscopic  exam.   Peripheral Vascular Upper Extremity Inspection - Left - No Cyanotic nailbeds - Left, Not Ischemic. Inspection - Right - No Cyanotic nailbeds - Right, Not Ischemic.  Neurologic Neurologic evaluation reveals -normal attention span and ability to concentrate, able to name objects and repeat phrases. Appropriate fund of knowledge , normal sensation and normal coordination. Mental Status Affect - not angry, not paranoid. Cranial Nerves-Normal Bilaterally. Gait-Normal.  Neuropsychiatric Mental status exam performed with findings of-able to articulate well with normal speech/language, rate, volume and coherence, thought content normal with ability to perform basic computations and apply abstract reasoning and no evidence of hallucinations, delusions, obsessions or homicidal/suicidal ideation.  Musculoskeletal Global Assessment Spine, Ribs and Pelvis - no instability, subluxation or laxity. Right Upper Extremity - no instability, subluxation or laxity.  Lymphatic Head & Neck  General Head & Neck Lymphatics: Bilateral - Description - No Localized lymphadenopathy. Axillary  General Axillary Region: Bilateral - Description - No Localized lymphadenopathy. Femoral & Inguinal  Generalized Femoral & Inguinal Lymphatics: Left - Description - No Localized lymphadenopathy. Right - Description - No Localized lymphadenopathy.    Assessment & Plan Adin Hector MD; 05/21/2019 1:16 PM) BILATERAL INGUINAL HERNIA WITHOUT OBSTRUCTION OR GANGRENE, RECURRENCE NOT SPECIFIED (K40.20) Impression: Obvious bilateral inguinal hernias. Right greater than left, but right is more symptomatic.  I think he would benefit from repair of his inguinal hernias. Laparoscopic approach. TEP repair but may have to convert to TAPP given his morbid obesity. We will see. PREOP - ING HERNIA - ENCOUNTER FOR PREOPERATIVE EXAMINATION FOR GENERAL SURGICAL PROCEDURE (Z01.818) Current Plans You are being scheduled  for surgery- Our schedulers will call you.  You should hear from our office's scheduling department within 5 working days about the location, date, and time of surgery. We try to make accommodations for patient's preferences in scheduling surgery, but sometimes the OR  schedule or the surgeon's schedule prevents [place] from making those accommodations.  If you have not heard from our office [phone]) in 5 working days, call the office and ask for your surgeon's nurse.  If you have other questions about your diagnosis, plan, or surgery, call the office and ask for your surgeon's nurse.  Written instructions provided The anatomy & physiology of the abdominal wall and pelvic floor was discussed. The pathophysiology of hernias in the inguinal and pelvic region was discussed. Natural history risks such as progressive enlargement, pain, incarceration, and strangulation was discussed. Contributors to complications such as smoking, obesity, diabetes, prior surgery, etc were discussed.  I feel the risks of no intervention will lead to serious problems that outweigh the operative risks; therefore, I recommended surgery to reduce and repair the hernia. I explained laparoscopic techniques with possible need for an open approach. I noted usual use of mesh to patch and/or buttress hernia repair  Risks such as bleeding, infection, abscess, need for further treatment, heart attack, death, and other risks were discussed. I noted a good likelihood this will help address the problem. Goals of post-operative recovery were discussed as well. Possibility that this will not correct all symptoms was explained. I stressed the importance of low-impact activity, aggressive pain control, avoiding constipation, & not pushing through pain to minimize risk of post-operative chronic pain or injury. Possibility of reherniation was discussed. We will work to minimize complications.  An educational handout further  explaining the pathology & treatment options was given as well. Questions were answered. The patient expresses understanding & wishes to proceed with surgery.  Pt Education - Pamphlet Given - Laparoscopic Hernia Repair: discussed with patient and provided information. Pt Education - CCS Pain Control (Markeeta Scalf) Pt Education - [facility] Hernia Post-Op HCI (Dalphine Cowie): discussed with patient and provided information. Pt Education - CCS Mesh education: discussed with patient and provided information. 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, [address] [place], [place] [address] [phone] Main / Paging [phone] Fax

## 2019-06-06 ENCOUNTER — Telehealth: Payer: Self-pay

## 2019-06-06 DIAGNOSIS — J4530 Mild persistent asthma, uncomplicated: Secondary | ICD-10-CM

## 2019-06-06 DIAGNOSIS — R062 Wheezing: Secondary | ICD-10-CM

## 2019-06-06 NOTE — Telephone Encounter (Signed)
Pt was called and given information, he verbalized understanding  

## 2019-06-06 NOTE — Telephone Encounter (Signed)
Pt called and left VM on nurses line stating that he went to a urgent care last week for SOB, Wheezing, cough. He states it is the same SOB and wheezing he has had and Dr Claiborne Billings told him she wanted to refer to pulmonology if it continued. Pt was referred to [facility] by urgent care but would rather stay within in [facility] (referred to [facility]). He is asking for referral. Pt was seen 04/03/19 and 04/30/19 for Wheezing and SOB.

## 2019-06-06 NOTE — Telephone Encounter (Signed)
Referral placed for him

## 2019-06-06 NOTE — Addendum Note (Signed)
Addended by: Felix Pacini A on: 06/06/2019 03:48 PM   Modules accepted: Orders

## 2019-06-21 ENCOUNTER — Telehealth: Payer: Self-pay

## 2019-06-21 NOTE — Telephone Encounter (Signed)
Yes, ok for work note.

## 2019-06-21 NOTE — Telephone Encounter (Signed)
Work note completed and sent to [employer]. Pt notified.

## 2019-06-21 NOTE — Telephone Encounter (Signed)
Patient had virtual, 12/30 for COVID symptoms, givenn rx for Prednisone 40mg  qd x 5d.  Albuterol HFA 2 p q4h prn. Z-pack.  Please advise if okay for work note.

## 2019-06-21 NOTE — Telephone Encounter (Signed)
Patient is needing note from December visit with Dr. Milinda Cave. Patient was having COVID symptoms, but tested NEG at [facility] pharmacy. Patient stated that Dr. Milinda Cave told patient he felt patient had COVID. Work is requiring a note from 03/21/19 visit stating that employee was out of work because of COVID . Patient request note emailed today.  Patient can be reached at [phone].  Please email work note to:     [email or web address]

## 2019-06-25 ENCOUNTER — Ambulatory Visit: Payer: Managed Care, Other (non HMO) | Attending: Family Medicine

## 2019-06-25 DIAGNOSIS — Z20822 Contact with and (suspected) exposure to covid-19: Secondary | ICD-10-CM

## 2019-06-25 NOTE — Telephone Encounter (Signed)
Work note faxed/emailed to patient by Set designer.  Client [facility] - Client Client Site [facility] [place] - Day Physician Claiborne Billings, [place] Contact Type Call Who Is Calling Patient / Member / Family / Caregiver Caller Name Nicholas Anthony Caller Phone Number [phone] Patient Name Nicholas Anthony Patient DOB 1980-08-23 Call Type Message Only Information Provided Reason for Call Request for General Office Information Initial Comment Caller states they called yesterday spoke to dianne, they were supposed to have medical records emailed to him, states he never did get that Additional Comment [email or web address] Disp. Time Disposition Final User 06/22/2019 9:17:03 AM General Information Provided Yes Nonnie Done Call Closed By: Nonnie Done Transaction Date/Time: 06/22/2019 9:14:33 AM (ET)

## 2019-06-25 NOTE — Telephone Encounter (Signed)
Patient having trouble with his email. Patient requested work note to be faxed to him directly [phone].

## 2019-06-26 LAB — NOVEL CORONAVIRUS, NAA: SARS-CoV-2, NAA: NOT DETECTED

## 2019-06-26 LAB — SARS-COV-2, NAA 2 DAY TAT

## 2019-06-27 ENCOUNTER — Other Ambulatory Visit: Payer: Self-pay | Admitting: Family Medicine

## 2019-06-27 DIAGNOSIS — R41840 Attention and concentration deficit: Secondary | ICD-10-CM

## 2019-06-27 DIAGNOSIS — F419 Anxiety disorder, unspecified: Secondary | ICD-10-CM

## 2019-07-09 ENCOUNTER — Encounter: Payer: Self-pay | Admitting: Emergency Medicine

## 2019-07-09 ENCOUNTER — Ambulatory Visit (INDEPENDENT_AMBULATORY_CARE_PROVIDER_SITE_OTHER): Payer: Managed Care, Other (non HMO) | Admitting: Emergency Medicine

## 2019-07-09 ENCOUNTER — Other Ambulatory Visit: Payer: Self-pay

## 2019-07-09 DIAGNOSIS — J4530 Mild persistent asthma, uncomplicated: Secondary | ICD-10-CM

## 2019-07-09 DIAGNOSIS — J301 Allergic rhinitis due to pollen: Secondary | ICD-10-CM

## 2019-07-09 DIAGNOSIS — J309 Allergic rhinitis, unspecified: Secondary | ICD-10-CM | POA: Insufficient documentation

## 2019-07-09 MED ORDER — IRBESARTAN 75 MG PO TABS: 75.0000 mg | ORAL_TABLET | Freq: Every day | ORAL | 5 refills | Status: DC

## 2019-07-09 NOTE — Progress Notes (Signed)
Subjective:    Patient ID: Nicholas Anthony, male    DOB: 02-11-1981, 39 y.o.   MRN: [id number]  HPI 39 year old never smoker with history of hypertension, childhood asthma.  He was diagnosed with COVID-19 pneumonia in December 2020, received a steroid taper. In the aftermath of that he has had wheeze, cough, persistent SOB that never really returned to his baseline. Sx have been episodic - can start quickly after being outside, start with cough and exertional SOB with activities that are typically easy. 3 steroid tapers total since December. Was started on Advair in January - he isn't sure whether it is helping much. He does use albuterol about 3x a day, does help him, makes him less SOB. Cold air may be a trigger, yardwork exposure to grasses also. Sometimes cleaning chemicals.   He has dry eyes, itching, dry cough usually during the allergy season.  He has had trouble doing his exercise routine, had lost 90 lbs since July 2019, but then gained back 40 lbs since August 2020.   Has been on flonase qd, zyrtec qd.   He is on Zestril, has been on this for 2 yrs.   CXR 04/03/19 reviewed by me shows clear lungs, no infiltrates. Repeat COVID-19 testing 06/25/2019 negative.     Review of Systems As per HPI.    Past Medical History:  Diagnosis Date  . Allergy   . Anal fissure   . Asthma   . Chicken pox   . HTN (hypertension)   . 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 Diabetes Father   . Heart disease Father   . Hyperlipidemia Father   . Hypertension Father   . Drug abuse Sister   . Alcohol abuse Sister   . Arthritis Paternal Grandmother   . Depression Paternal Grandmother   . Diabetes Paternal Grandmother   . Hyperlipidemia Paternal Grandmother   . Heart disease Paternal Grandmother   . Hypertension Paternal Grandmother   . Arthritis Paternal Grandfather   . Hearing loss Paternal Grandfather   . Heart disease Paternal Grandfather   .  Hyperlipidemia Paternal Grandfather   . Hypertension Paternal Grandfather   . Kidney disease Paternal Grandfather   . Drug abuse Brother      Social History   Socioeconomic History  . Marital status: Married    Spouse name: Not on file  . Number of children: 2  . Years of education: Not on file  . Highest education level: Not on file  Occupational History  . Occupation: Retail buyer  Tobacco Use  . Smoking status: Never Smoker  . Smokeless tobacco: Never Used  Substance and Sexual Activity  . Alcohol use: Never  . Drug use: Never  . Sexual activity: Yes    Partners: Female  Other Topics Concern  . 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    Drinks caffeine.    Smoke alarm in the home. Wears his seatbelt.    Feels safe in his relationship.    Social Determinants of Health   Financial Resource Strain:   . Difficulty of Paying Living Expenses:   Food Insecurity:   . Worried About Programme researcher, broadcasting/film/video in the Last Year:   . Barista in the Last Year:   Transportation Needs:   . Freight forwarder (Medical):   Marland Kitchen Lack of  Transportation (Non-Medical):   Physical Activity:   . Days of Exercise per Week:   . Minutes of Exercise per Session:   Stress:   . Feeling of Stress :   Social Connections:   . Frequency of Communication with Friends and Family:   . Frequency of Social Gatherings with Friends and Family:   . Attends Religious Services:   . Active Member of Clubs or Organizations:   . Attends Archivist Meetings:   Marland Kitchen Marital Status:   Intimate Partner Violence:   . Fear of Current or Ex-Partner:   . Emotionally Abused:   Marland Kitchen Physically Abused:   . Sexually Abused:      Allergies  Allergen Reactions  . Penicillins Other (See Comments)    Unsure has not taken since childhood Told he was allergic as a child Told he was allergic as a child      Outpatient  Medications Prior to Visit  Medication Sig Dispense Refill  . albuterol (VENTOLIN HFA) 108 (90 Base) MCG/ACT inhaler Inhale 2 puffs into the lungs every 4 (four) hours as needed for wheezing or shortness of breath. 18 g 5  . FLUoxetine (PROZAC) 20 MG tablet Take 2 tablets (40 mg total) by mouth daily. 60 tablet 5  . fluticasone (FLONASE) 50 MCG/ACT nasal spray Place 2 sprays into both nostrils daily. 16 g 6  . Fluticasone-Salmeterol (ADVAIR) 100-50 MCG/DOSE AEPB Inhale 1 puff into the lungs 2 (two) times daily. 1 each 11  . lisinopril (ZESTRIL) 10 MG tablet Take 1 tablet (10 mg total) by mouth daily. 30 tablet 5  . triamcinolone cream (KENALOG) 0.1 % Apply 1 application topically 2 (two) times daily. 30 g 2  . venlafaxine XR (EFFEXOR-XR) 75 MG 24 hr capsule Take 3 capsules (225 mg total) by mouth daily with breakfast. 90 capsule 5   No facility-administered medications prior to visit.        Objective:   Physical Exam  Vitals:   07/09/19 0904  BP: 120/64  Pulse: 84  SpO2: 99%  Weight: (!) 343 lb (155.6 kg)  Height: 6\' 3"  (1.905 m)   Gen: Pleasant, obese gentleman, in no distress,  normal affect  ENT: No lesions,  mouth clear, somewhat crowded posterior pharynx,  oropharynx clear, no postnasal drip  Neck: No JVD, no stridor  Lungs: No use of accessory muscles, no crackles or wheezing on normal respiration, no wheeze on forced expiration  Cardiovascular: RRR, heart sounds normal, no murmur or gallops, no peripheral edema  Musculoskeletal: No deformities, no cyanosis or clubbing  Neuro: alert, awake, non focal  Skin: Warm, no lesions or rash     Assessment & Plan:  Mild persistent asthma without complication Entire clinical picture is suspicious for asthma, potentially brought on by COVID-19 pneumonitis in December 2020.  That being said he does have other potential contributors to dyspnea and cough including weight gain, ACE inhibitor use, active allergy symptoms.  We will  perform pulmonary function testing to assess for obstructive lung disease.  I will temporarily stop his Advair, continue albuterol as needed.  Change his lisinopril to irbesartan and see if he notes benefit.  Allergic rhinitis Continue Zyrtec, fluticasone nasal spray.   Baltazar Apo, MD, PhD 07/09/2019, 9:36 AM [facility] [phone] or if no answer [phone]

## 2019-07-09 NOTE — Assessment & Plan Note (Signed)
Entire clinical picture is suspicious for asthma, potentially brought on by COVID-19 pneumonitis in December 2020.  That being said he does have other potential contributors to dyspnea and cough including weight gain, ACE inhibitor use, active allergy symptoms.  We will perform pulmonary function testing to assess for obstructive lung disease.  I will temporarily stop his Advair, continue albuterol as needed.  Change his lisinopril to irbesartan and see if he notes benefit.

## 2019-07-09 NOTE — Patient Instructions (Addendum)
Please stop your Advair for now. Keep albuterol available to use 2 puffs if needed for shortness of breath, chest tightness, wheezing. We will perform full pulmonary function testing in next office visit. Stop lisinopril for now.  We will start irbesartan 75mg  once a day.  Continue your flonase and zyrtec as you have been taking them  Follow with Dr. next available with full pulmonary function testing on the same day.

## 2019-07-09 NOTE — Assessment & Plan Note (Signed)
Continue Zyrtec, fluticasone nasal spray 

## 2019-07-20 ENCOUNTER — Other Ambulatory Visit: Payer: Self-pay

## 2019-07-20 ENCOUNTER — Other Ambulatory Visit (HOSPITAL_COMMUNITY)
Admission: RE | Admit: 2019-07-20 | Discharge: 2019-07-20 | Disposition: A | Discharge status: Home / Self Care | Payer: Managed Care, Other (non HMO) | Source: Ambulatory Visit | Attending: Emergency Medicine | Admitting: Emergency Medicine

## 2019-07-20 DIAGNOSIS — Z20822 Contact with and (suspected) exposure to covid-19: Secondary | ICD-10-CM | POA: Insufficient documentation

## 2019-07-20 DIAGNOSIS — Z01812 Encounter for preprocedural laboratory examination: Secondary | ICD-10-CM | POA: Diagnosis present

## 2019-07-21 LAB — SARS CORONAVIRUS 2 (TAT 6-24 HRS): SARS Coronavirus 2: NEGATIVE

## 2019-07-24 ENCOUNTER — Other Ambulatory Visit: Payer: Self-pay

## 2019-07-24 ENCOUNTER — Encounter: Payer: Self-pay | Admitting: Family Medicine

## 2019-07-24 ENCOUNTER — Ambulatory Visit (INDEPENDENT_AMBULATORY_CARE_PROVIDER_SITE_OTHER): Payer: Managed Care, Other (non HMO) | Admitting: Family Medicine

## 2019-07-24 VITALS — BP 133/90 | HR 92 | Temp 97.1°F | Resp 18 | Ht 75.0 in | Wt 346.1 lb

## 2019-07-24 DIAGNOSIS — F419 Anxiety disorder, unspecified: Secondary | ICD-10-CM

## 2019-07-24 DIAGNOSIS — R41840 Attention and concentration deficit: Secondary | ICD-10-CM

## 2019-07-24 DIAGNOSIS — R21 Rash and other nonspecific skin eruption: Secondary | ICD-10-CM

## 2019-07-24 DIAGNOSIS — I10 Essential (primary) hypertension: Secondary | ICD-10-CM | POA: Diagnosis not present

## 2019-07-24 MED ORDER — VENLAFAXINE HCL ER 75 MG PO CP24: 225.0000 mg | ORAL_CAPSULE | Freq: Every day | ORAL | 5 refills | Status: DC

## 2019-07-24 MED ORDER — FLUOXETINE HCL 20 MG PO TABS: 40.0000 mg | ORAL_TABLET | Freq: Every day | ORAL | 5 refills | Status: DC

## 2019-07-24 MED ORDER — AMPHETAMINE-DEXTROAMPHETAMINE 10 MG PO TABS: ORAL_TABLET | ORAL | 0 refills | Status: DC

## 2019-07-24 MED ORDER — TRIAMCINOLONE ACETONIDE 0.1 % EX CREA: 1.0000 "application " | TOPICAL_CREAM | Freq: Two times a day (BID) | CUTANEOUS | 2 refills | Status: DC

## 2019-07-24 NOTE — Patient Instructions (Signed)
1/2 tab twice  Day- make sure 2nd dose is before 1 pm.  After 1 week if needed only increase to 1 tab twice a day.    Follow up in 4 weeks.

## 2019-07-24 NOTE — Progress Notes (Signed)
Patient ID: Nicholas Anthony, male  DOB: 1980/08/18, 39 y.o.   MRN: [id number] Patient Care Team    Relationship Specialty Notifications Start End  Ma Hillock, DO PCP - General Family Medicine  09/12/17     Chief Complaint  Patient presents with  . Hypertension  . Anxiety  . ADD    Pt feels medication is not working like it should. would like to discuss.     Subjective: Nicholas Anthony is a 39 y.o.  Male  present for [facility] Hypertension/morbid obesity/ophthalmological abnormality:  Patient reports compliance irbesartan 75 mg QD- changed by pulm. Patient denies chest pain, shortness of breath, dizziness or lower extremity edema. .  Pt does not take a daily baby ASA. Pt is is not prescribed statin. Labs UTD 11/2018 Microalbumin: 09/12/2017 within normal limits Urine microalb: ordered today Diet: low sodium Exercise: encouraged routinely.  RF: HTN, obesity, Fhx heart disease  ADD/Anxiety:   Patient reports he does not feel the Effexor 225 mg daily is working as well as it has in the past for his attention.  He is also prescribed Prozac 40 mg daily.  He states the irritability and anxiety seems to be well controlled.  He is still complaining of difficulty focusing and completing tasks.  He states he does frequently forget appointments.  He endorses having a lot on his plate including working, educational classes and taking care of his family.  Depression screen [facility] 2/9 07/24/2019 12/14/2018 05/26/2018 11/25/2017 09/12/2017  Decreased Interest 0 0 0 0 0  Down, Depressed, Hopeless 0 0 0 2 0  PHQ - 2 Score 0 0 0 2 0  Altered sleeping - 3 3 0 -  Tired, decreased energy - 0 0 0 -  Change in appetite - 0 0 0 -  Feeling bad or failure about yourself  - 0 0 0 -  Trouble concentrating - 1 0 3 -  Moving slowly or fidgety/restless - 0 0 3 -  Suicidal thoughts - 0 0 0 -  PHQ-9 Score - 4 3 8  -  Difficult doing work/chores - Not difficult at all Not difficult at all Very difficult -   GAD 7 :  Generalized Anxiety Score 07/24/2019 12/14/2018 05/26/2018 01/23/2018  Nervous, Anxious, on Edge 1 0 0 3  Control/stop worrying 0 0 0 0  Worry too much - different things 1 0 0 0  Trouble relaxing 3 0 0 1  Restless 3 0 0 3  Easily annoyed or irritable 0 0 0 3  Afraid - awful might happen 0 0 0 0  Total GAD 7 Score 8 0 0 10  Anxiety Difficulty Somewhat difficult Not difficult at all Not difficult at all Extremely difficult    Immunization History  Administered Date(s) Administered  . Influenza Split 03/30/2014  . Influenza,inj,Quad PF,6+ Mos 11/25/2017, 12/14/2018  . Tdap 03/30/2014   Past Medical History:  Diagnosis Date  . Allergy   . Anal fissure   . Asthma   . Bilateral inguinal hernia (BIH) 05/21/2019  . Chicken pox   . HTN (hypertension)   . Prediabetes 10/03/2017  . Retinal hemorrhage 09/02/2017  . Tick bite 12/14/2018   Allergies  Allergen Reactions  . Penicillins Other (See Comments)    Unsure has not taken since childhood Told he was allergic as a child Told he was allergic as a child    Past Surgical History:  Procedure Laterality Date  . SPHINCTEROTOMY  2020   Family History  Problem Relation Age of Onset  . Diabetes Father   . Heart disease Father   . Hyperlipidemia Father   . Hypertension Father   . Drug abuse Sister   . Alcohol abuse Sister   . Arthritis Paternal Grandmother   . Depression Paternal Grandmother   . Diabetes Paternal Grandmother   . Hyperlipidemia Paternal Grandmother   . Heart disease Paternal Grandmother   . Hypertension Paternal Grandmother   . Arthritis Paternal Grandfather   . Hearing loss Paternal Grandfather   . Heart disease Paternal Grandfather   . Hyperlipidemia Paternal Grandfather   . Hypertension Paternal Grandfather   . Kidney disease Paternal Grandfather   . 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    Drinks caffeine.     Smoke alarm in the home. Wears his seatbelt.    Feels safe in his relationship.     Allergies as of 07/24/2019      Reactions   Penicillins Other (See Comments)   Unsure has not taken since childhood Told he was allergic as a child Told he was allergic as a child      Medication List       Accurate as of Jul 24, 2019 11:59 PM. If you have any questions, ask your nurse or doctor.        albuterol 108 (90 Base) MCG/ACT inhaler Commonly known as: Ventolin HFA Inhale 2 puffs into the lungs every 4 (four) hours as needed for wheezing or shortness of breath.   amphetamine-dextroamphetamine 10 MG tablet Commonly known as: Adderall 1 tab BID Started by: Felix Pacini, DO   FLUoxetine 20 MG tablet Commonly known as: PROZAC Take 2 tablets (40 mg total) by mouth daily.   fluticasone 50 MCG/ACT nasal spray Commonly known as: FLONASE Place 2 sprays into both nostrils daily.   irbesartan 75 MG tablet Commonly known as: Avapro Take 1 tablet (75 mg total) by mouth daily.   triamcinolone cream 0.1 % Commonly known as: KENALOG Apply 1 application topically 2 (two) times daily.   venlafaxine XR 75 MG 24 hr capsule Commonly known as: EFFEXOR-XR Take 3 capsules (225 mg total) by mouth daily with breakfast.       All past medical history, surgical history, allergies, family history, immunizations andmedications were updated in the EMR today and reviewed under the history and medication portions of their EMR.      Patient was never admitted.   ROS: 14 pt review of systems performed and negative (unless mentioned in an HPI)   Objective: BP 133/90 (BP Location: Left Arm, Patient Position: Sitting, Cuff Size: Large)   Pulse 92   Temp (!) 97.1 F (36.2 C) (Temporal)   Resp 18   Ht 6\' 3"  (1.905 m)   Wt (!) 346 lb 2 oz (157 kg)   SpO2 98%   BMI 43.26 kg/m  Gen: Afebrile. No acute distress.  Overweight.  Male HENT: AT. [place].  Eyes:Pupils Equal Round Reactive to light,  Extraocular movements intact,  Conjunctiva without redness, discharge or icterus. CV: RRR no murmur, no edema Chest: CTAB, no wheeze or crackles Skin: Flat erythemic rash bilateral lower extremities over shin, no purpura or petechiae.  Neuro: Normal gait. PERLA. EOMi. Alert. Oriented x3  Psych: Normal affect, dress and demeanor. Normal speech. Normal thought content and judgment.   No exam data present  Assessment/plan:  is a 39 y.o. male present for CPE anxiety/Attention deficit -He feels anxiety is controlled on current dose- however he now feels his attention is not as well controlled.   -I do have some concern he is taking on too much responsibility on many different facets within his life. Discussed with him there is only so much a human being can accomplish and focus on at one time.  Agreed to try low-dose stimulant medication with caution on his blood pressure and worsening his anxiety.  If needing higher doses would refer to attention specialist for further evaluation. -Continue Effexor 225 mg daily.   -Continue Prozac 40 mg daily.  -Start Adderall 5 mg twice daily, may taper to 10 mg twice daily after 1 week if needed only -Follow-up 1 month on the addition of Adderall.  If he would like to continue at that time will need to sign controlled substance contract and complete UDS.  Hypertension/obesity/dizziness:  -Borderline blood pressure today.  Appears pulmonology changed his blood pressure medication. - Continue Avapro 75 mg daily, may need to increase at next office visit in 1 month if blood pressure still remain elevated. - Continue diet and weight loss regimen, routine exercise.  -Low sodium diet. Labs up-to-date 11/2018 -Follow-up in 6 months  Rash:  - PMLE on shins after sunburn is possible cause.  However it has remained in his creating discomfort at night. Can continue Kenalog cream  refer to dermatology for further evaluation.   No follow-ups on  file. Orders Placed This Encounter  Procedures  . Ambulatory referral to Dermatology   Meds ordered this encounter  Medications  . amphetamine-dextroamphetamine (ADDERALL) 10 MG tablet    Sig: 1 tab BID    Dispense:  60 tablet    Refill:  0  . venlafaxine XR (EFFEXOR-XR) 75 MG 24 hr capsule    Sig: Take 3 capsules (225 mg total) by mouth daily with breakfast.    Dispense:  90 capsule    Refill:  5  . FLUoxetine (PROZAC) 20 MG tablet    Sig: Take 2 tablets (40 mg total) by mouth daily.    Dispense:  60 tablet    Refill:  5  . triamcinolone cream (KENALOG) 0.1 %    Sig: Apply 1 application topically 2 (two) times daily.    Dispense:  30 g    Refill:  2    Referral Orders     Ambulatory referral to Dermatology  Electronically signed by: Felix Pacini, DO [facility] [place]

## 2019-07-25 ENCOUNTER — Telehealth: Payer: Self-pay

## 2019-07-25 ENCOUNTER — Ambulatory Visit (INDEPENDENT_AMBULATORY_CARE_PROVIDER_SITE_OTHER): Payer: Managed Care, Other (non HMO) | Admitting: Emergency Medicine

## 2019-07-25 DIAGNOSIS — J4530 Mild persistent asthma, uncomplicated: Secondary | ICD-10-CM | POA: Diagnosis not present

## 2019-07-25 LAB — PULMONARY FUNCTION TEST
DL/VA % pred: 111 %
DL/VA: 5.11 ml/min/mmHg/L
DLCO cor % pred: 114 %
DLCO cor: 41.87 ml/min/mmHg
DLCO unc % pred: 114 %
DLCO unc: 41.87 ml/min/mmHg
FEF 25-75 Post: 6.65 L/sec
FEF 25-75 Pre: 5.95 L/sec
FEF2575-%Change-Post: 11 %
FEF2575-%Pred-Post: 146 %
FEF2575-%Pred-Pre: 130 %
FEV1-%Change-Post: 3 %
FEV1-%Pred-Post: 108 %
FEV1-%Pred-Pre: 105 %
FEV1-Post: 5.35 L
FEV1-Pre: 5.18 L
FEV1FVC-%Change-Post: 3 %
FEV1FVC-%Pred-Pre: 105 %
FEV6-%Change-Post: 0 %
FEV6-%Pred-Post: 100 %
FEV6-%Pred-Pre: 100 %
FEV6-Post: 6.1 L
FEV6-Pre: 6.1 L
FEV6FVC-%Change-Post: 0 %
FEV6FVC-%Pred-Post: 102 %
FEV6FVC-%Pred-Pre: 102 %
FVC-%Change-Post: 0 %
FVC-%Pred-Post: 98 %
FVC-%Pred-Pre: 98 %
FVC-Post: 6.12 L
FVC-Pre: 6.11 L
Post FEV1/FVC ratio: 88 %
Post FEV6/FVC ratio: 100 %
Pre FEV1/FVC ratio: 85 %
Pre FEV6/FVC Ratio: 100 %
RV % pred: 103 %
RV: 2.13 L
TLC % pred: 110 %
TLC: 8.76 L

## 2019-07-25 NOTE — Progress Notes (Signed)
Full PFT performed today. °

## 2019-07-25 NOTE — Telephone Encounter (Signed)
Received fax from pharmacy stating pts medication, adderall, would need a prior auth. Attempted to complete prior auth on cover my meds but request got sent back stating Pt not found.   Called [employer] to complete prior auth by phone at [phone]. They stated pt does not have pharmacy benefits through them. I called and spoke with pharmacist and they gave me updated information for pts RX coverage.

## 2019-07-25 NOTE — Telephone Encounter (Signed)
Request done through Cover my meds  [place] Jaquith (Key: [id number])

## 2019-07-27 NOTE — Telephone Encounter (Signed)
Per Cover my meds, Medication approved on [id number] from 2019-07-25. Pharmacy notified.

## 2019-08-16 ENCOUNTER — Ambulatory Visit (INDEPENDENT_AMBULATORY_CARE_PROVIDER_SITE_OTHER): Payer: Managed Care, Other (non HMO) | Admitting: Emergency Medicine

## 2019-08-16 ENCOUNTER — Other Ambulatory Visit: Payer: Self-pay

## 2019-08-16 ENCOUNTER — Encounter: Payer: Self-pay | Admitting: Emergency Medicine

## 2019-08-16 DIAGNOSIS — J301 Allergic rhinitis due to pollen: Secondary | ICD-10-CM | POA: Diagnosis not present

## 2019-08-16 DIAGNOSIS — J4520 Mild intermittent asthma, uncomplicated: Secondary | ICD-10-CM | POA: Diagnosis not present

## 2019-08-16 NOTE — Progress Notes (Signed)
Subjective:    Patient ID: Nicholas Anthony, male    DOB: January 04, 1981, 39 y.o.   MRN: [id number]  HPI 39 year old never smoker with history of hypertension, childhood asthma.  He was diagnosed with COVID-19 pneumonia in December 2020, received a steroid taper. In the aftermath of that he has had wheeze, cough, persistent SOB that never really returned to his baseline. Sx have been episodic - can start quickly after being outside, start with cough and exertional SOB with activities that are typically easy. 3 steroid tapers total since December. Was started on Advair in January - he isn't sure whether it is helping much. He does use albuterol about 3x a day, does help him, makes him less SOB. Cold air may be a trigger, yardwork exposure to grasses also. Sometimes cleaning chemicals.   He has dry eyes, itching, dry cough usually during the allergy season.  He has had trouble doing his exercise routine, had lost 90 lbs since July 2019, but then gained back 40 lbs since August 2020.   Has been on flonase qd, zyrtec qd.   He is on Zestril, has been on this for 2 yrs.   CXR 04/03/19 reviewed by me shows clear lungs, no infiltrates. Repeat COVID-19 testing 06/25/2019 negative.   ROV 08/16/19 --follow-up visit for 39 year old gentleman with Covid pneumonitis in December 2020, subsequent dyspnea in a pattern consistent with asthma, associated with wheezing, cough.  Responded to steroids.  At his initial visit I changed his ACE inhibitor to irbesartan, continue his allergy regimen.  He had pulmonary function testing done on 07/25/2019 which I have reviewed, shows normal airflows without a bronchodilator response, normal lung volumes and a normal diffusion capacity.  The flow volume loop is normal as well.  He still has some cough daily, some white phlegm. Seems to be better off the ACE-I. The cough is associated with his drainage. He does have some GERD sx occasionally - not every day.     Review of Systems As  per HPI.      Objective:   Physical Exam  Vitals:   08/16/19 0855 08/16/19 0857  BP: 120/70 120/70  Pulse:  79  Temp:  98 F (36.7 C)  TempSrc:  Oral  SpO2:  99%  Weight:  (!) 345 lb 6.4 oz (156.7 kg)  Height:  6\' 3"  (1.905 m)   Gen: Pleasant, obese gentleman, in no distress,  normal affect  ENT: No lesions,  mouth clear, somewhat crowded posterior pharynx,  oropharynx clear, no postnasal drip  Neck: No JVD, no stridor  Lungs: No use of accessory muscles, no crackles or wheezing on normal respiration, no wheeze on forced expiration  Cardiovascular: RRR, heart sounds normal, no murmur or gallops, no peripheral edema  Musculoskeletal: No deformities, no cyanosis or clubbing  Neuro: alert, awake, non focal  Skin: Warm, no lesions or rash     Assessment & Plan:  Mild intermittent asthma Normal PFT on 07/25/19.  Steadily better since his Covid diagnosis.  If he does have asthma suspect it is mild intermittent.  Okay for him to continue to use albuterol as needed.  If his records of use increases then we may decide to recheck spirometry, consider schedule BD or ICS.  Need to control allergies as these appear to be significant contributor.  Discussed his blood pressure regimen with him today.  He is okay with continuing the irbesartan and titrate the dose with his primary care provider.  Allergic rhinitis Continue same regimen.  May need to consider allergy referral if he has persistent symptoms on the Zyrtec and nasal steroid   Baltazar Apo, MD, PhD 08/16/2019, 9:21 AM [facility] [phone] or if no answer [phone]

## 2019-08-16 NOTE — Assessment & Plan Note (Signed)
Continue same regimen.  May need to consider allergy referral if he has persistent symptoms on the Zyrtec and nasal steroid

## 2019-08-16 NOTE — Patient Instructions (Addendum)
Stay on the irbesartan for now and discuss with Dr Claiborne Billings the dosing going forward.  Keep albuterol available to use 2 puffs as needed for shortness of breath, wheeze.  Continue your flonase and zyrtec as you have been taking them  Start working on increasing your activity level to build you stamina.  Follow with Dr. Delton Coombes in 12 months or sooner if you have any problems.

## 2019-08-16 NOTE — Assessment & Plan Note (Addendum)
Normal PFT on 07/25/19.  Steadily better since his Covid diagnosis.  If he does have asthma suspect it is mild intermittent.  Okay for him to continue to use albuterol as needed.  If his records of use increases then we may decide to recheck spirometry, consider schedule BD or ICS.  Need to control allergies as these appear to be significant contributor.  Discussed his blood pressure regimen with him today.  He is okay with continuing the irbesartan and titrate the dose with his primary care provider.

## 2019-08-21 ENCOUNTER — Other Ambulatory Visit: Payer: Self-pay

## 2019-08-21 ENCOUNTER — Ambulatory Visit (INDEPENDENT_AMBULATORY_CARE_PROVIDER_SITE_OTHER): Payer: Managed Care, Other (non HMO) | Admitting: Family Medicine

## 2019-08-21 ENCOUNTER — Encounter: Payer: Self-pay | Admitting: Family Medicine

## 2019-08-21 VITALS — BP 135/89 | HR 76 | Temp 97.2°F | Resp 18 | Ht 75.0 in | Wt 345.0 lb

## 2019-08-21 DIAGNOSIS — F419 Anxiety disorder, unspecified: Secondary | ICD-10-CM | POA: Diagnosis not present

## 2019-08-21 DIAGNOSIS — R41840 Attention and concentration deficit: Secondary | ICD-10-CM | POA: Diagnosis not present

## 2019-08-21 DIAGNOSIS — I10 Essential (primary) hypertension: Secondary | ICD-10-CM | POA: Diagnosis not present

## 2019-08-21 MED ORDER — AMPHETAMINE-DEXTROAMPHETAMINE 10 MG PO TABS: ORAL_TABLET | ORAL | 0 refills | Status: DC

## 2019-08-21 MED ORDER — IRBESARTAN 75 MG PO TABS: ORAL_TABLET | ORAL | 5 refills | Status: DC

## 2019-08-21 NOTE — Progress Notes (Signed)
Patient ID: Nicholas Anthony, male  DOB: 09/05/1980, 39 y.o.   MRN: [id number] Patient Care Team    Relationship Specialty Notifications Start End  Natalia Leatherwood, DO PCP - General Family Medicine  09/12/17     Chief Complaint  Patient presents with  . ADD    Pt is doing well with medications     Subjective: Nicholas Anthony is a 39 y.o.  Male  present for [facility] Hypertension/morbid obesity/ophthalmological abnormality:  Patient reports compliance irbesartan 75 mg QD- changed by pulm. Patient denies chest pain, shortness of breath, dizziness or lower extremity edema.   Pt does not take a daily baby ASA. Pt is is not prescribed statin. Labs UTD 11/2018 Microalbumin: 09/12/2017 within normal limits Urine microalb: ordered today Diet: low sodium Exercise: encouraged routinely.  RF: HTN, obesity, Fhx heart disease  ADD/Anxiety:  Patient reports the addition of Adderall 10 mg twice daily has helped him tremendously.  He is continue the Effexor and the Prozac.  He reports his anxiety is still well controlled and he has noticed additional focus with the Adderall. Prior note: Patient reports he does not feel the Effexor 225 mg daily is working as well as it has in the past for his attention.  He is also prescribed Prozac 40 mg daily.  He states the irritability and anxiety seems to be well controlled.  He is still complaining of difficulty focusing and completing tasks.  He states he does frequently forget appointments.  He endorses having a lot on his plate including working, educational classes and taking care of his family.  Depression screen [facility] 2/9 07/24/2019 12/14/2018 05/26/2018 11/25/2017 09/12/2017  Decreased Interest 0 0 0 0 0  Down, Depressed, Hopeless 0 0 0 2 0  PHQ - 2 Score 0 0 0 2 0  Altered sleeping - 3 3 0 -  Tired, decreased energy - 0 0 0 -  Change in appetite - 0 0 0 -  Feeling bad or failure about yourself  - 0 0 0 -  Trouble concentrating - 1 0 3 -  Moving slowly or  fidgety/restless - 0 0 3 -  Suicidal thoughts - 0 0 0 -  PHQ-9 Score - 4 3 8  -  Difficult doing work/chores - Not difficult at all Not difficult at all Very difficult -   GAD 7 : Generalized Anxiety Score 07/24/2019 12/14/2018 05/26/2018 01/23/2018  Nervous, Anxious, on Edge 1 0 0 3  Control/stop worrying 0 0 0 0  Worry too much - different things 1 0 0 0  Trouble relaxing 3 0 0 1  Restless 3 0 0 3  Easily annoyed or irritable 0 0 0 3  Afraid - awful might happen 0 0 0 0  Total GAD 7 Score 8 0 0 10  Anxiety Difficulty Somewhat difficult Not difficult at all Not difficult at all Extremely difficult    Immunization History  Administered Date(s) Administered  . Influenza Split 03/30/2014  . Influenza,inj,Quad PF,6+ Mos 11/25/2017, 12/14/2018  . Tdap 03/30/2014   Past Medical History:  Diagnosis Date  . Allergy   . Anal fissure   . Asthma   . Bilateral inguinal hernia (BIH) 05/21/2019  . Chicken pox   . HTN (hypertension)   . Prediabetes 10/03/2017  . Retinal hemorrhage 09/02/2017  . Tick bite 12/14/2018   Allergies  Allergen Reactions  . Penicillins Other (See Comments)    Unsure has not taken since childhood Told he was allergic as  a child Told he was allergic as a child    Past Surgical History:  Procedure Laterality Date  . SPHINCTEROTOMY  2020   Family History  Problem Relation Age of Onset  . Diabetes Father   . Heart disease Father   . Hyperlipidemia Father   . Hypertension Father   . Drug abuse Sister   . Alcohol abuse Sister   . Arthritis Paternal Grandmother   . Depression Paternal Grandmother   . Diabetes Paternal Grandmother   . Hyperlipidemia Paternal Grandmother   . Heart disease Paternal Grandmother   . Hypertension Paternal Grandmother   . Arthritis Paternal Grandfather   . Hearing loss Paternal Grandfather   . Heart disease Paternal Grandfather   . Hyperlipidemia Paternal Grandfather   . Hypertension Paternal Grandfather   . Kidney disease Paternal  Grandfather   . 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    Drinks caffeine.    Smoke alarm in the home. Wears his seatbelt.    Feels safe in his relationship.     Allergies as of 08/21/2019      Reactions   Penicillins Other (See Comments)   Unsure has not taken since childhood Told he was allergic as a child Told he was allergic as a child      Medication List       Accurate as of August 21, 2019  5:37 PM. If you have any questions, ask your nurse or doctor.        albuterol 108 (90 Base) MCG/ACT inhaler Commonly known as: Ventolin HFA Inhale 2 puffs into the lungs every 4 (four) hours as needed for wheezing or shortness of breath.   amphetamine-dextroamphetamine 10 MG tablet Commonly known as: Adderall 1 tab BID   FLUoxetine 20 MG tablet Commonly known as: PROZAC Take 2 tablets (40 mg total) by mouth daily.   fluticasone 50 MCG/ACT nasal spray Commonly known as: FLONASE Place 2 sprays into both nostrils daily.   irbesartan 75 MG tablet Commonly known as: Avapro 1.5 tab PO  daily What changed:   how much to take  how to take this  when to take this  additional instructions Changed by: Felix Pacini, DO   triamcinolone cream 0.1 % Commonly known as: KENALOG Apply 1 application topically 2 (two) times daily.   venlafaxine XR 75 MG 24 hr capsule Commonly known as: EFFEXOR-XR Take 3 capsules (225 mg total) by mouth daily with breakfast.       All past medical history, surgical history, allergies, family history, immunizations andmedications were updated in the EMR today and reviewed under the history and medication portions of their EMR.      Patient was never admitted.   ROS: 14 pt review of systems performed and negative (unless mentioned in an HPI)   Objective: BP 135/89 (BP Location: Left Arm, Patient Position: Sitting, Cuff Size: Large)   Pulse  76   Temp (!) 97.2 F (36.2 C) (Temporal)   Resp 18   Ht 6\' 3"  (1.905 m)   Wt (!) 345 lb (156.5 kg)   SpO2 98%   BMI 43.12 kg/m  Gen: Afebrile. No acute distress.  HENT: AT. [place].  Eyes:Pupils Equal Round Reactive to light, Extraocular movements intact,  Conjunctiva without redness, discharge or icterus. CV: RRR, no edema Chest: CTAB, no wheeze or crackles Skin: no rashes, purpura or petechiae.  Neuro:  Normal gait. PERLA. EOMi. Alert. Oriented.Marland Kitchen Psych: Normal affect, dress and demeanor. Normal speech. Normal thought content and judgment..    No exam data present  Assessment/plan: Rogen Porte is a 39 y.o. male present for CPE anxiety/Attention deficit - stable.   Improved with the addition of Adderall -I do have some concern he is taking on too much responsibility on many different facets within his life. Discussed with him there is only so much a human being can accomplish and focus on at one time.  Agreed to try low-dose stimulant medication with caution on his blood pressure and worsening his anxiety.  If needing higher doses would refer to attention specialist for further evaluation. -Continue Effexor 225 mg daily.   -Continue Prozac 40 mg daily.  -continue  Adderall 10 mg twice daily - [place] controlled database reviewed - Controlled substance contract signed 08/21/2019 F/u q 3 mos face to face required for controlled substance.   Hypertension/obesity/dizziness:  -Borderline blood pressure again today.   Increase  Avapro 75 mg to 1.5 tabs daily.  - Continue diet and weight loss regimen, routine exercise.  -Low sodium diet. Labs up-to-date 11/2018  Return in about 3 months (around 11/12/2019) for CMC (30 min). No orders of the defined types were placed in this encounter.  Meds ordered this encounter  Medications  . irbesartan (AVAPRO) 75 MG tablet    Sig: 1.5 tab PO  daily    Dispense:  45 tablet    Refill:  5    PLease DC all prior scripts for this medication  .  DISCONTD: amphetamine-dextroamphetamine (ADDERALL) 10 MG tablet    Sig: 1 tab BID    Dispense:  60 tablet    Refill:  0  . DISCONTD: amphetamine-dextroamphetamine (ADDERALL) 10 MG tablet    Sig: 1 tab BID    Dispense:  60 tablet    Refill:  0  . DISCONTD: amphetamine-dextroamphetamine (ADDERALL) 10 MG tablet    Sig: 1 tab BID    Dispense:  60 tablet    Refill:  0    May refill after 09/17/2019  . amphetamine-dextroamphetamine (ADDERALL) 10 MG tablet    Sig: 1 tab BID    Dispense:  60 tablet    Refill:  0    May refill after 10/13/2019   Referral Orders  No referral(s) requested today    Electronically signed by: Howard Pouch, [place]

## 2019-08-21 NOTE — Patient Instructions (Signed)
I am glad you are doing well.  You will have 3 scripts at the pharmacy for your adderall. You will need to call then every month to have it filled.  Follow up every 3 months required for control substance.

## 2019-10-17 ENCOUNTER — Encounter: Payer: Self-pay | Admitting: Physician Assistant

## 2019-10-17 ENCOUNTER — Other Ambulatory Visit: Payer: Self-pay

## 2019-10-17 ENCOUNTER — Ambulatory Visit (INDEPENDENT_AMBULATORY_CARE_PROVIDER_SITE_OTHER): Payer: Managed Care, Other (non HMO) | Admitting: Physician Assistant

## 2019-10-17 DIAGNOSIS — L309 Dermatitis, unspecified: Secondary | ICD-10-CM | POA: Diagnosis not present

## 2019-10-17 DIAGNOSIS — B353 Tinea pedis: Secondary | ICD-10-CM | POA: Diagnosis not present

## 2019-10-17 DIAGNOSIS — B351 Tinea unguium: Secondary | ICD-10-CM

## 2019-10-17 DIAGNOSIS — B352 Tinea manuum: Secondary | ICD-10-CM

## 2019-10-17 MED ORDER — CLOBETASOL PROPIONATE 0.05 % EX CREA: 1.0000 "application " | TOPICAL_CREAM | Freq: Two times a day (BID) | CUTANEOUS | 2 refills | Status: DC

## 2019-10-17 NOTE — Progress Notes (Signed)
   New Patient Visit  Subjective  Nicholas Anthony is a 39 y.o. male who presents for the following: Skin Problem (left hand irritation present for years has tried triamcinolone cream, redness and itching on bilateral shins present for about a year history of sunburn has tried triamcinolone cream).Rash on left hand for years. Scales and itches especially between the fingers. He has been given Triamcinolone cream and tried aquaphor at night. The rash on the shins started after he got a bad sunburn 1 year ago while in a kayak. The left shin is worse than the right. Since the sunburn the area has stayed discolored and itched. He has not worn shorts since then.  Objective  Well appearing patient in no apparent distress; mood and affect are within normal limits.  A focused examination was performed including hands, legs and feet. Relevant physical exam findings are noted in the Assessment and Plan.  Objective  Left Mid Palm: Scaling and erythema left palm. KOH positive  Objective  Left Lower Leg - Anterior, Right Lower Leg - Anterior: Erythema and mild scale overlying the left greater than the right shin  Assessment & Plan  Tinea pedis of right foot (2) Right Foot - Anterior; Right Hallux Toe Nail Plate  Tinea pedis of left foot (3) Left Hand - Anterior; Left Foot - Anterior; Left 3rd Finger Nail Plate  Onychomycosis (2) Left 3rd Finger Distal Interphalangeal Joint; Right Hallux Proximal Dorsal Toe  Because patient finds creams hard to use on the hand he is interested in doing lamisil pills to treat the skin and the fingernail and toenail.  Other Related Procedures CBC Comprehensive metabolic panel  Tinea manuum Left Mid Palm  Because he also has involvement of the left third fingernail and the right great toenail we discussed whether we would just treat the skin with a topical medicine or use lamisil pills to treat the nails also. The patient states that it is difficult for him to use  a cream and he would rather do the lamisil pills and treat the nails also.  Other Related Procedures POCT Skin KOH  Dermatitis (2) Left Lower Leg - Anterior; Right Lower Leg - Anterior  Ordered Medications: clobetasol cream (TEMOVATE) 0.05 %

## 2019-10-17 NOTE — Patient Instructions (Addendum)
Apply cream to shins morning and night.  At night after putting cream on, wrap leg with cool damp towel for about 20 minutes.  Do this for 2-3 weeks.   Please go to [facility] to have labwork done.  [facility] [address] [place], [place] [address] [phone]  Your results will be released to your MyChart account.

## 2019-10-18 ENCOUNTER — Ambulatory Visit: Payer: Managed Care, Other (non HMO) | Admitting: Physician Assistant

## 2019-10-18 LAB — COMPREHENSIVE METABOLIC PANEL
AG Ratio: 1.7 (calc) (ref 1.0–2.5)
ALT: 32 U/L (ref 9–46)
AST: 23 U/L (ref 10–40)
Albumin: 4.2 g/dL (ref 3.6–5.1)
Alkaline phosphatase (APISO): 61 U/L (ref 36–130)
BUN: 11 mg/dL (ref 7–25)
CO2: 25 mmol/L (ref 20–32)
Calcium: 8.7 mg/dL (ref 8.6–10.3)
Chloride: 103 mmol/L (ref 98–110)
Creat: 0.89 mg/dL (ref 0.60–1.35)
Globulin: 2.5 g/dL (calc) (ref 1.9–3.7)
Glucose, Bld: 93 mg/dL (ref 65–139)
Potassium: 4.1 mmol/L (ref 3.5–5.3)
Sodium: 138 mmol/L (ref 135–146)
Total Bilirubin: 0.4 mg/dL (ref 0.2–1.2)
Total Protein: 6.7 g/dL (ref 6.1–8.1)

## 2019-10-18 LAB — CBC
HCT: 43.8 % (ref 38.5–50.0)
Hemoglobin: 14.6 g/dL (ref 13.2–17.1)
MCH: 28.3 pg (ref 27.0–33.0)
MCHC: 33.3 g/dL (ref 32.0–36.0)
MCV: 85 fL (ref 80.0–100.0)
MPV: 10.4 fL (ref 7.5–12.5)
Platelets: 337 10*3/uL (ref 140–400)
RBC: 5.15 10*6/uL (ref 4.20–5.80)
RDW: 13 % (ref 11.0–15.0)
WBC: 10.1 10*3/uL (ref 3.8–10.8)

## 2019-10-19 ENCOUNTER — Telehealth: Payer: Self-pay

## 2019-10-19 LAB — POCT SKIN KOH: Skin KOH, POC: POSITIVE — AB

## 2019-10-19 MED ORDER — TERBINAFINE HCL 250 MG PO TABS: 250.0000 mg | ORAL_TABLET | Freq: Every day | ORAL | 1 refills | Status: DC

## 2019-10-19 NOTE — Telephone Encounter (Signed)
Patient aware.

## 2019-10-19 NOTE — Telephone Encounter (Signed)
-----   Message from Shelly Flatten, [place] sent at 10/18/2019  3:20 PM EDT ----- Labs ok to start lamisil. 250 mg qd #30 with 1 refill. Needs follow up and will need labs done again in 6 weeks.

## 2019-10-19 NOTE — Telephone Encounter (Signed)
-----   Message from Jennifer Clark-Bruning, PA-C sent at 10/18/2019  3:20 PM EDT ----- Labs ok to start lamisil. 250 mg qd #30 with 1 refill. Needs follow up and will need labs done again in 6 weeks. 

## 2019-10-19 NOTE — Telephone Encounter (Signed)
Left message for patient to call office.  

## 2019-10-25 ENCOUNTER — Other Ambulatory Visit: Payer: Self-pay | Admitting: Family Medicine

## 2019-10-25 NOTE — Telephone Encounter (Signed)
Please return pts call. He was seen June 1st for his ADD. He was prescribed 3 months if prescriptions. The last prescription could not be refilled until after 10/13/2019. He likely has not picked up the last prescription. Encourage him to call his pharmacy and speak to the tech (do not try to call in prescription # from bottle). He has a script there.

## 2019-10-25 NOTE — Telephone Encounter (Signed)
Spoke with pt. He states she picked up a script yesterday and is not sure why the pharmacy sent [place] a notification.

## 2019-10-25 NOTE — Telephone Encounter (Signed)
Last office visit- 08-21-2019 Last refill- 08-21-2019..  60 tabs @1  tab bid Next appointment- 11-12-2019

## 2019-11-12 ENCOUNTER — Encounter: Payer: Self-pay | Admitting: Family Medicine

## 2019-11-12 ENCOUNTER — Ambulatory Visit (INDEPENDENT_AMBULATORY_CARE_PROVIDER_SITE_OTHER): Payer: Managed Care, Other (non HMO) | Admitting: Family Medicine

## 2019-11-12 ENCOUNTER — Other Ambulatory Visit: Payer: Self-pay

## 2019-11-12 VITALS — BP 129/86 | HR 77 | Temp 98.2°F | Resp 16 | Wt 350.4 lb

## 2019-11-12 DIAGNOSIS — F419 Anxiety disorder, unspecified: Secondary | ICD-10-CM | POA: Diagnosis not present

## 2019-11-12 DIAGNOSIS — R41840 Attention and concentration deficit: Secondary | ICD-10-CM

## 2019-11-12 DIAGNOSIS — I10 Essential (primary) hypertension: Secondary | ICD-10-CM | POA: Diagnosis not present

## 2019-11-12 MED ORDER — VENLAFAXINE HCL ER 75 MG PO CP24: 225.0000 mg | ORAL_CAPSULE | Freq: Every day | ORAL | 5 refills | Status: DC

## 2019-11-12 MED ORDER — AMPHETAMINE-DEXTROAMPHETAMINE 10 MG PO TABS
ORAL_TABLET | ORAL | 0 refills | Status: DC
Start: 2019-11-12 — End: 2019-11-12

## 2019-11-12 MED ORDER — FLUOXETINE HCL 20 MG PO TABS: 40.0000 mg | ORAL_TABLET | Freq: Every day | ORAL | 5 refills | Status: DC

## 2019-11-12 MED ORDER — OMEPRAZOLE 20 MG PO CPDR: 20.0000 mg | DELAYED_RELEASE_CAPSULE | Freq: Every day | ORAL | 3 refills | Status: DC

## 2019-11-12 MED ORDER — IRBESARTAN 75 MG PO TABS: ORAL_TABLET | ORAL | 5 refills | Status: DC

## 2019-11-12 MED ORDER — AMPHETAMINE-DEXTROAMPHETAMINE 10 MG PO TABS
ORAL_TABLET | ORAL | 0 refills | Status: DC
Start: 2019-11-12 — End: 2020-02-06

## 2019-11-12 NOTE — Patient Instructions (Signed)
Follow up in 3 months.  Try to see if you have a preferred pharmacy. If you do and we can call in 3 mos at a time of scripts at your next appt  we may be able to go out to every 6 mos appts then.

## 2019-11-12 NOTE — Progress Notes (Signed)
Patient ID: Nicholas Anthony, male  DOB: 04/02/1980, 39 y.o.   MRN: [id number] Patient Care Team    Relationship Specialty Notifications Start End  Nicholas Leatherwood, DO PCP - General Family Medicine  09/12/17   Nicholas Anthony, [facility]  Dermatology  10/17/19     Chief Complaint  Patient presents with  . Follow-up    [facility]    Subjective: Nicholas Anthony is a 39 y.o.  Male  present for [facility] Hypertension/morbid obesity/ophthalmological abnormality:  Patient reports compliance  irbesartan 75 mg QD (1.5 tabs). Patient denies chest pain, shortness of breath, dizziness or lower extremity edema.  Pt does not take a daily baby ASA. Pt is is not prescribed statin. Labs UTD 11/2018 Diet: low sodium Exercise: encouraged routinely.  RF: HTN, obesity, Fhx heart disease  ADD/Anxiety:  Patient reports his ADD and anxiety is well controlled on Effexor, Prozac and Adderall.  [place] controlled substance database reviewed today. Prior note: Patient reports he does not feel the Effexor 225 mg daily is working as well as it has in the past for his attention.  He is also prescribed Prozac 40 mg daily.  He states the irritability and anxiety seems to be well controlled.  He is still complaining of difficulty focusing and completing tasks.  He states he does frequently forget appointments.  He endorses having a lot on his plate including working, educational classes and taking care of his family.  Depression screen [facility] 2/9 07/24/2019 12/14/2018 05/26/2018 11/25/2017 09/12/2017  Decreased Interest 0 0 0 0 0  Down, Depressed, Hopeless 0 0 0 2 0  PHQ - 2 Score 0 0 0 2 0  Altered sleeping - 3 3 0 -  Tired, decreased energy - 0 0 0 -  Change in appetite - 0 0 0 -  Feeling bad or failure about yourself  - 0 0 0 -  Trouble concentrating - 1 0 3 -  Moving slowly or fidgety/restless - 0 0 3 -  Suicidal thoughts - 0 0 0 -  PHQ-9 Score - 4 3 8  -  Difficult doing work/chores - Not difficult at all Not  difficult at all Very difficult -   GAD 7 : Generalized Anxiety Score 07/24/2019 12/14/2018 05/26/2018 01/23/2018  Nervous, Anxious, on Edge 1 0 0 3  Control/stop worrying 0 0 0 0  Worry too much - different things 1 0 0 0  Trouble relaxing 3 0 0 1  Restless 3 0 0 3  Easily annoyed or irritable 0 0 0 3  Afraid - awful might happen 0 0 0 0  Total GAD 7 Score 8 0 0 10  Anxiety Difficulty Somewhat difficult Not difficult at all Not difficult at all Extremely difficult    Immunization History  Administered Date(s) Administered  . Influenza Split 03/30/2014  . Influenza,inj,Quad PF,6+ Mos 11/25/2017, 12/14/2018  . Tdap 03/30/2014   Past Medical History:  Diagnosis Date  . Allergy   . Anal fissure   . Asthma   . Bilateral inguinal hernia (BIH) 05/21/2019  . Chicken pox   . HTN (hypertension)   . Prediabetes 10/03/2017  . Retinal hemorrhage 09/02/2017  . Tick bite 12/14/2018   Allergies  Allergen Reactions  . Penicillins Other (See Comments)    Unsure has not taken since childhood Told he was allergic as a child Told he was allergic as a child    Past Surgical History:  Procedure Laterality Date  . SPHINCTEROTOMY  2020  Family History  Problem Relation Age of Onset  . Diabetes Father   . Heart disease Father   . Hyperlipidemia Father   . Hypertension Father   . Drug abuse Sister   . Alcohol abuse Sister   . Arthritis Paternal Grandmother   . Depression Paternal Grandmother   . Diabetes Paternal Grandmother   . Hyperlipidemia Paternal Grandmother   . Heart disease Paternal Grandmother   . Hypertension Paternal Grandmother   . Arthritis Paternal Grandfather   . Hearing loss Paternal Grandfather   . Heart disease Paternal Grandfather   . Hyperlipidemia Paternal Grandfather   . Hypertension Paternal Grandfather   . Kidney disease Paternal Grandfather   . Drug abuse Brother    Social History   Social History Narrative   Married, employed,    In college. Currently  working as a Games developer.    Drinks caffeine.    Smoke alarm in the home. Wears his seatbelt.    Feels safe in his relationship.     Allergies as of 11/12/2019      Reactions   Penicillins Other (See Comments)   Unsure has not taken since childhood Told he was allergic as a child Told he was allergic as a child      Medication List       Accurate as of November 12, 2019  5:55 PM. If you have any questions, ask your nurse or doctor.        albuterol 108 (90 Base) MCG/ACT inhaler Commonly known as: Ventolin HFA Inhale 2 puffs into the lungs every 4 (four) hours as needed for wheezing or shortness of breath.   amphetamine-dextroamphetamine 10 MG tablet Commonly known as: Adderall 1 tab BID   clobetasol cream 0.05 % Commonly known as: TEMOVATE Apply 1 application topically 2 (two) times daily. Apply to affected area   FLUoxetine 20 MG tablet Commonly known as: PROZAC Take 2 tablets (40 mg total) by mouth daily.   fluticasone 50 MCG/ACT nasal spray Commonly known as: FLONASE Place 2 sprays into both nostrils daily.   irbesartan 75 MG tablet Commonly known as: Avapro 1.5 tab PO  daily   omeprazole 20 MG capsule Commonly known as: PRILOSEC Take 1 capsule (20 mg total) by mouth daily. Started by: Nicholas Pacini, DO   terbinafine 250 MG tablet Commonly known as: LamISIL Take 1 tablet (250 mg total) by mouth daily.   triamcinolone cream 0.1 % Commonly known as: KENALOG Apply 1 application topically 2 (two) times daily.   venlafaxine XR 75 MG 24 hr capsule Commonly known as: EFFEXOR-XR Take 3 capsules (225 mg total) by mouth daily with breakfast.       All past medical history, surgical history, allergies, family history, immunizations andmedications were updated in the EMR today and reviewed under the history and medication portions of their EMR.      Patient was never admitted.   ROS: 14 pt review of systems performed and negative (unless mentioned in an  HPI)   Objective: BP 129/86 (BP Location: Left Arm, Patient Position: Sitting, Cuff Size: Large)   Pulse 77   Temp 98.2 F (36.8 C) (Oral)   Resp 16   Wt (!) 350 lb 6.4 oz (158.9 kg)   SpO2 97%   BMI 43.80 kg/m  Gen: Afebrile. No acute distress.  HENT: AT. [place].  Eyes:Pupils Equal Round Reactive to light, Extraocular movements intact,  Conjunctiva without redness, discharge or icterus. CV: RRR no murmur, no edema, +2/4 P posterior  tibialis pulses Chest: CTAB, no wheeze or crackles Abd: Soft.  Obese. NTND. BS present.  No masses palpated.  Skin: No rashes, purpura or petechiae.  Neuro:  Normal gait. PERLA. EOMi. Alert. Oriented x3  Psych: Normal affect, dress and demeanor. Normal speech. Normal thought content and judgment.   No exam data present  Assessment/plan: Nicholas Anthony is a 39 y.o. male present for CPE anxiety/Attention deficit Stable. -Continue Effexor 225 mg daily.   -Continue Prozac 40 mg daily.  -Continue Adderall 10 mg twice daily (x3 scripts provided) - [place] controlled database reviewed 11/12/19 - Controlled substance contract signed 08/21/2019 F/u q 3 mos face to face required for controlled substance.   Hypertension/obesity/dizziness:  Stable. Continue Avapro 75 mg to 1.5 tabs daily.  - Continue diet and weight loss regimen, routine exercise.  -Low sodium diet. Labs up-to-date 11/2018 Follow-up every 3 months on ADD in 6 months on hypertension.  No follow-ups on file. No orders of the defined types were placed in this encounter.  Meds ordered this encounter  Medications  . venlafaxine XR (EFFEXOR-XR) 75 MG 24 hr capsule    Sig: Take 3 capsules (225 mg total) by mouth daily with breakfast.    Dispense:  90 capsule    Refill:  5  . irbesartan (AVAPRO) 75 MG tablet    Sig: 1.5 tab PO  daily    Dispense:  45 tablet    Refill:  5    PLease DC all prior scripts for this medication  . FLUoxetine (PROZAC) 20 MG tablet    Sig: Take 2 tablets (40 mg total)  by mouth daily.    Dispense:  60 tablet    Refill:  5  . omeprazole (PRILOSEC) 20 MG capsule    Sig: Take 1 capsule (20 mg total) by mouth daily.    Dispense:  30 capsule    Refill:  3  . DISCONTD: amphetamine-dextroamphetamine (ADDERALL) 10 MG tablet    Sig: 1 tab BID    Dispense:  60 tablet    Refill:  0  . DISCONTD: amphetamine-dextroamphetamine (ADDERALL) 10 MG tablet    Sig: 1 tab BID    Dispense:  60 tablet    Refill:  0    May refill after 12/07/2019  . amphetamine-dextroamphetamine (ADDERALL) 10 MG tablet    Sig: 1 tab BID    Dispense:  60 tablet    Refill:  0    May refill after 01/03/2020   Referral Orders  No referral(s) requested today    Electronically signed by: Nicholas Pacini, DO [facility]

## 2019-12-06 ENCOUNTER — Other Ambulatory Visit: Payer: Self-pay

## 2019-12-06 ENCOUNTER — Ambulatory Visit (INDEPENDENT_AMBULATORY_CARE_PROVIDER_SITE_OTHER): Payer: Managed Care, Other (non HMO) | Admitting: Physician Assistant

## 2019-12-06 ENCOUNTER — Encounter: Payer: Self-pay | Admitting: Physician Assistant

## 2019-12-06 DIAGNOSIS — B353 Tinea pedis: Secondary | ICD-10-CM

## 2019-12-06 DIAGNOSIS — B352 Tinea manuum: Secondary | ICD-10-CM

## 2019-12-06 DIAGNOSIS — L280 Lichen simplex chronicus: Secondary | ICD-10-CM

## 2019-12-06 MED ORDER — CICLOPIROX OLAMINE 0.77 % EX CREA: TOPICAL_CREAM | Freq: Every day | CUTANEOUS | 2 refills | Status: DC

## 2019-12-06 NOTE — Progress Notes (Signed)
   Follow up Visit  Subjective  Nicholas Anthony is a 39 y.o. male who presents for the following: Follow-up (from 7/28 visit--Tinea pedis--treated with clobetasol 0.05% and tebinafine 250 mg--improve). Everything seems to be improving. His hand is clear after having trouble with it for years.   Objective  Well appearing patient in no apparent distress; mood and affect are within normal limits.  A focused examination was performed including hand, legs and feet. Relevant physical exam findings are noted in the Assessment and Plan.   Objective  Right Foot - Anterior: Scaling of skin on feet is almost clear. Toenails still show discoloration, thickening and lifting.  Objective  Left Hand - Anterior: Scaling of left palm has cleared and nails on left hand show new growth.  Objective  Left Lower Leg - Anterior, Right Lower Leg - Anterior: Hyperpigmentation noted shins. No active eczematous patches.   Assessment & Plan  Tinea pedis of right foot Right Foot - Anterior  He needs to have his 6 week cmp and cbc and if these are normal he can finish his last month of lamisil  Other Related Procedures CBC with Differential/Platelets CMP  Ordered Medications: ciclopirox (LOPROX) 0.77 % cream  Tinea pedis of left foot Left Foot - Anterior  Other Related Procedures CBC with Differential/Platelets CMP  Ordered Medications: ciclopirox (LOPROX) 0.77 % cream  Tinea manuum Left Hand - Anterior  Lichen simplex chronicus (2) Left Lower Leg - Anterior; Right Lower Leg - Anterior  Clobetasol prn for itch. moisturize

## 2019-12-07 ENCOUNTER — Telehealth: Payer: Self-pay | Admitting: *Deleted

## 2019-12-07 ENCOUNTER — Encounter: Payer: Self-pay | Admitting: *Deleted

## 2019-12-07 LAB — COMPREHENSIVE METABOLIC PANEL
AG Ratio: 2.1 (calc) (ref 1.0–2.5)
ALT: 34 U/L (ref 9–46)
AST: 19 U/L (ref 10–40)
Albumin: 4.5 g/dL (ref 3.6–5.1)
Alkaline phosphatase (APISO): 58 U/L (ref 36–130)
BUN: 22 mg/dL (ref 7–25)
CO2: 26 mmol/L (ref 20–32)
Calcium: 9.1 mg/dL (ref 8.6–10.3)
Chloride: 106 mmol/L (ref 98–110)
Creat: 0.87 mg/dL (ref 0.60–1.35)
Globulin: 2.1 g/dL (calc) (ref 1.9–3.7)
Glucose, Bld: 75 mg/dL (ref 65–99)
Potassium: 4 mmol/L (ref 3.5–5.3)
Sodium: 140 mmol/L (ref 135–146)
Total Bilirubin: 0.4 mg/dL (ref 0.2–1.2)
Total Protein: 6.6 g/dL (ref 6.1–8.1)

## 2019-12-07 LAB — CBC WITH DIFFERENTIAL/PLATELET
Absolute Monocytes: 523 cells/uL (ref 200–950)
Basophils Absolute: 83 cells/uL (ref 0–200)
Basophils Relative: 1 %
Eosinophils Absolute: 515 cells/uL — ABNORMAL HIGH (ref 15–500)
Eosinophils Relative: 6.2 %
HCT: 43.5 % (ref 38.5–50.0)
Hemoglobin: 14.6 g/dL (ref 13.2–17.1)
Lymphs Abs: 2407 cells/uL (ref 850–3900)
MCH: 28.4 pg (ref 27.0–33.0)
MCHC: 33.6 g/dL (ref 32.0–36.0)
MCV: 84.6 fL (ref 80.0–100.0)
MPV: 10.8 fL (ref 7.5–12.5)
Monocytes Relative: 6.3 %
Neutro Abs: 4773 cells/uL (ref 1500–7800)
Neutrophils Relative %: 57.5 %
Platelets: 313 10*3/uL (ref 140–400)
RBC: 5.14 10*6/uL (ref 4.20–5.80)
RDW: 13.1 % (ref 11.0–15.0)
Total Lymphocyte: 29 %
WBC: 8.3 10*3/uL (ref 3.8–10.8)

## 2019-12-07 MED ORDER — TERBINAFINE HCL 250 MG PO TABS
250.0000 mg | ORAL_TABLET | Freq: Every day | ORAL | 0 refills | Status: DC
Start: 2019-12-07 — End: 2019-12-27

## 2019-12-07 NOTE — Telephone Encounter (Signed)
-----   Message from Shelly Flatten, [place] sent at 12/07/2019 10:59 AM EDT ----- His labs look good. He can have his last month of terbinafine 250mg  #30 No refill.

## 2019-12-07 NOTE — Telephone Encounter (Signed)
Labs look good per Onnie Boer bruning- prescription sent to pharmacy

## 2019-12-27 ENCOUNTER — Other Ambulatory Visit: Payer: Self-pay | Admitting: *Deleted

## 2019-12-27 ENCOUNTER — Encounter: Payer: Self-pay | Admitting: *Deleted

## 2019-12-27 MED ORDER — TERBINAFINE HCL 250 MG PO TABS
250.0000 mg | ORAL_TABLET | Freq: Every day | ORAL | 0 refills | Status: DC
Start: 2019-12-27 — End: 2020-07-25

## 2019-12-27 NOTE — Telephone Encounter (Signed)
Prescription sent to wrong pharmacy per patient, resent to patient pharmacy of choice

## 2020-01-20 ENCOUNTER — Other Ambulatory Visit: Payer: Self-pay | Admitting: Family Medicine

## 2020-01-31 ENCOUNTER — Other Ambulatory Visit: Payer: Self-pay | Admitting: Family Medicine

## 2020-02-06 ENCOUNTER — Ambulatory Visit (INDEPENDENT_AMBULATORY_CARE_PROVIDER_SITE_OTHER): Payer: Managed Care, Other (non HMO) | Admitting: Family Medicine

## 2020-02-06 ENCOUNTER — Encounter: Payer: Self-pay | Admitting: Family Medicine

## 2020-02-06 ENCOUNTER — Other Ambulatory Visit: Payer: Self-pay

## 2020-02-06 VITALS — BP 133/85 | HR 97 | Temp 98.4°F | Ht 75.0 in | Wt 366.0 lb

## 2020-02-06 DIAGNOSIS — R41840 Attention and concentration deficit: Secondary | ICD-10-CM | POA: Diagnosis not present

## 2020-02-06 DIAGNOSIS — F419 Anxiety disorder, unspecified: Secondary | ICD-10-CM

## 2020-02-06 DIAGNOSIS — I10 Essential (primary) hypertension: Secondary | ICD-10-CM | POA: Diagnosis not present

## 2020-02-06 MED ORDER — AMPHETAMINE-DEXTROAMPHETAMINE 10 MG PO TABS
ORAL_TABLET | ORAL | 0 refills | Status: DC
Start: 2020-02-06 — End: 2020-07-25

## 2020-02-06 MED ORDER — VENLAFAXINE HCL ER 75 MG PO CP24: 225.0000 mg | ORAL_CAPSULE | Freq: Every day | ORAL | 1 refills | Status: DC

## 2020-02-06 MED ORDER — FLUOXETINE HCL 20 MG PO TABS
40.0000 mg | ORAL_TABLET | Freq: Every day | ORAL | 1 refills | Status: DC
Start: 2020-02-06 — End: 2020-07-08

## 2020-02-06 MED ORDER — IRBESARTAN 75 MG PO TABS
ORAL_TABLET | ORAL | 1 refills | Status: DC
Start: 2020-02-06 — End: 2020-07-25

## 2020-02-06 MED ORDER — OMEPRAZOLE 20 MG PO CPDR
20.0000 mg | DELAYED_RELEASE_CAPSULE | Freq: Every day | ORAL | 3 refills | Status: DC
Start: 2020-02-06 — End: 2020-07-25

## 2020-02-06 MED ORDER — AMPHETAMINE-DEXTROAMPHETAMINE 10 MG PO TABS
ORAL_TABLET | ORAL | 0 refills | Status: DC
Start: 2020-02-06 — End: 2020-02-06

## 2020-02-06 NOTE — Patient Instructions (Addendum)
I have refilled your meds.   Next appt 5.5 mos  Good luck on your exams!!!

## 2020-02-06 NOTE — Progress Notes (Signed)
Patient ID: Nicholas Anthony, male  DOB: [birth year], 39 y.o.   MRN: [id number] Patient Care Team    Relationship Specialty Notifications Start End  Natalia Leatherwood, DO PCP - General Family Medicine  09/12/17   Derenda Mis, [place]  Dermatology  10/17/19     Chief Complaint  Patient presents with  . Follow-up    [facility]    Subjective: Nicholas Anthony is a 39 y.o.  Male  present for [facility] Hypertension/morbid obesity/ophthalmological abnormality:  Patient reports compliance irbesartan 75 mg QD (1.5 tabs). Patient denies chest pain, shortness of breath, dizziness or lower extremity edema.  Pt does not take a daily baby ASA. Pt is is not prescribed statin. Diet: low sodium Exercise: encouraged routinely.  RF: HTN, obesity, Fhx heart disease Labs UTD 11/2019  ADD/Anxiety:  Patient reports his ADD and anxiety is well controlled on Effexor, Prozac and Adderall.  [place] controlled substance database reviewed today.  He graduates next week and is looking forward to having a few weeks of downtime.. Prior note: Patient reports he does not feel the Effexor 225 mg daily is working as well as it has in the past for his attention.  He is also prescribed Prozac 40 mg daily.  He states the irritability and anxiety seems to be well controlled.  He is still complaining of difficulty focusing and completing tasks.  He states he does frequently forget appointments.  He endorses having a lot on his plate including working, educational classes and taking care of his family.  Depression screen [facility] 2/9 02/06/2020 07/24/2019 12/14/2018 05/26/2018 11/25/2017  Decreased Interest 0 0 0 0 0  Down, Depressed, Hopeless 0 0 0 0 2  PHQ - 2 Score 0 0 0 0 2  Altered sleeping - - 3 3 0  Tired, decreased energy - - 0 0 0  Change in appetite - - 0 0 0  Feeling bad or failure about yourself  - - 0 0 0  Trouble concentrating - - 1 0 3  Moving slowly or fidgety/restless - - 0 0 3  Suicidal thoughts - - 0 0 0   PHQ-9 Score - - 4 3 8   Difficult doing work/chores - - Not difficult at all Not difficult at all Very difficult   GAD 7 : Generalized Anxiety Score 07/24/2019 12/14/2018 05/26/2018 01/23/2018  Nervous, Anxious, on Edge 1 0 0 3  Control/stop worrying 0 0 0 0  Worry too much - different things 1 0 0 0  Trouble relaxing 3 0 0 1  Restless 3 0 0 3  Easily annoyed or irritable 0 0 0 3  Afraid - awful might happen 0 0 0 0  Total GAD 7 Score 8 0 0 10  Anxiety Difficulty Somewhat difficult Not difficult at all Not difficult at all Extremely difficult    Immunization History  Administered Date(s) Administered  . Influenza Split 03/30/2014  . Influenza,inj,Quad PF,6+ Mos 11/25/2017, 12/14/2018  . Tdap 03/30/2014   Past Medical History:  Diagnosis Date  . Allergy   . Anal fissure   . Asthma   . Bilateral inguinal hernia (BIH) 05/21/2019  . Chicken pox   . HTN (hypertension)   . Prediabetes 10/03/2017  . Retinal hemorrhage 09/02/2017  . Tick bite 12/14/2018   Allergies  Allergen Reactions  . Penicillins Other (See Comments)    Unsure has not taken since childhood Told he was allergic as a child Told he was allergic as a child  Past Surgical History:  Procedure Laterality Date  . SPHINCTEROTOMY  2020   Family History  Problem Relation Age of Onset  . Diabetes Father   . Heart disease Father   . Hyperlipidemia Father   . Hypertension Father   . Drug abuse Sister   . Alcohol abuse Sister   . Arthritis Paternal Grandmother   . Depression Paternal Grandmother   . Diabetes Paternal Grandmother   . Hyperlipidemia Paternal Grandmother   . Heart disease Paternal Grandmother   . Hypertension Paternal Grandmother   . Arthritis Paternal Grandfather   . Hearing loss Paternal Grandfather   . Heart disease Paternal Grandfather   . Hyperlipidemia Paternal Grandfather   . Hypertension Paternal Grandfather   . Kidney disease Paternal Grandfather   . 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    Drinks caffeine.    Smoke alarm in the home. Wears his seatbelt.    Feels safe in his relationship.     Allergies as of 02/06/2020      Reactions   Penicillins Other (See Comments)   Unsure has not taken since childhood Told he was allergic as a child Told he was allergic as a child      Medication List       Accurate as of February 06, 2020 11:59 PM. If you have any questions, ask your nurse or doctor.        albuterol 108 (90 Base) MCG/ACT inhaler Commonly known as: VENTOLIN HFA INHALE 2 PUFFS BY MOUTH EVERY 4 HOURS AS NEEDED FOR WHEEZING OR SHORTNESS OF BREATH   amphetamine-dextroamphetamine 10 MG tablet Commonly known as: Adderall 1 tab BID   ciclopirox 0.77 % cream Commonly known as: LOPROX Apply topically daily.   clobetasol cream 0.05 % Commonly known as: TEMOVATE Apply 1 application topically 2 (two) times daily. Apply to affected area   FLUoxetine 20 MG tablet Commonly known as: PROZAC Take 2 tablets (40 mg total) by mouth daily. What changed: Another medication with the same name was removed. Continue taking this medication, and follow the directions you see here. Changed by: Felix Pacini, DO   fluticasone 50 MCG/ACT nasal spray Commonly known as: FLONASE USE 2 SPRAY IN EACH NOSTRIL DAILY   irbesartan 75 MG tablet Commonly known as: Avapro 1.5 tab PO  daily   meloxicam 15 MG tablet Commonly known as: MOBIC Take 15 mg by mouth daily.   omeprazole 20 MG capsule Commonly known as: PRILOSEC Take 1 capsule (20 mg total) by mouth daily.   terbinafine 250 MG tablet Commonly known as: LamISIL Take 1 tablet (250 mg total) by mouth daily.   triamcinolone 0.1 % Commonly known as: KENALOG Apply 1 application topically 2 (two) times daily.   venlafaxine XR 75 MG 24 hr capsule Commonly known as: EFFEXOR-XR Take 3 capsules (225 mg total) by  mouth daily with breakfast.       All past medical history, surgical history, allergies, family history, immunizations andmedications were updated in the EMR today and reviewed under the history and medication portions of their EMR.      Patient was never admitted.   ROS: 14 pt review of systems performed and negative (unless mentioned in an HPI)   Objective: BP 133/85   Pulse 97   Temp 98.4 F (36.9 C) (Oral)   Ht 6\' 3"  (1.905 m)  Wt (!) 366 lb (166 kg)   SpO2 97%   BMI 45.75 kg/m  Gen: Afebrile. No acute distress.  Nontoxic.  Pleasant male HENT: AT. [place].  Eyes:Pupils Equal Round Reactive to light, Extraocular movements intact,  Conjunctiva without redness, discharge or icterus. CV: RRR no murmur , trace edema, +2/4 P posterior tibialis pulses Chest: CTAB, no wheeze or crackles  Neuro:  Normal gait. PERLA. EOMi. Alert. Orientedx 3  Psych: Normal affect, dress and demeanor. Normal speech. Normal thought content and judgment.   No exam data present  Assessment/plan: Nicholas Anthony is a 39 y.o. male present for CPE anxiety/Attention deficit Stable -Continue Effexor 225 mg daily.   -Continue Prozac 40 mg daily.  -Continue Adderall 10 mg twice daily (#90, x2) - [place] controlled database reviewed 02/07/20 - Controlled substance contract signed 08/21/2019 F/u q 5.5 mos face to face required for controlled substance.   Hypertension/obesity:  Stable.  Mildly above his normal today with a trace of edema.  He has been eating more fast food and not hydrating.  Encouraged him to cut back on the sodium increase his hydration.  If elevated again on his next appointment would need to increase his blood pressure management Continue Avapro 75 mg to 1.5 tabs daily.  - Continue diet and weight loss regimen, routine exercise.  -Low sodium diet. Labs UTD 12/06/2019   Return in about 5 months (around 07/22/2020) for [facility]MC (30 min). No orders of the defined types were placed in this  encounter.  Meds ordered this encounter  Medications  . omeprazole (PRILOSEC) 20 MG capsule    Sig: Take 1 capsule (20 mg total) by mouth daily.    Dispense:  90 capsule    Refill:  3  . venlafaxine XR (EFFEXOR-XR) 75 MG 24 hr capsule    Sig: Take 3 capsules (225 mg total) by mouth daily with breakfast.    Dispense:  270 capsule    Refill:  1  . FLUoxetine (PROZAC) 20 MG tablet    Sig: Take 2 tablets (40 mg total) by mouth daily.    Dispense:  180 tablet    Refill:  1  . DISCONTD: amphetamine-dextroamphetamine (ADDERALL) 10 MG tablet    Sig: 1 tab BID    Dispense:  180 tablet    Refill:  0    May refill after 01/03/2020  . irbesartan (AVAPRO) 75 MG tablet    Sig: 1.5 tab PO  daily    Dispense:  135 tablet    Refill:  1    PLease DC all prior scripts for this medication  . amphetamine-dextroamphetamine (ADDERALL) 10 MG tablet    Sig: 1 tab BID    Dispense:  180 tablet    Refill:  0    May refill after 04/22/2020   Referral Orders  No referral(s) requested today    Electronically signed by: Felix Pacini, DO [facility] [place]

## 2020-03-04 ENCOUNTER — Other Ambulatory Visit: Payer: Self-pay | Admitting: Family Medicine

## 2020-04-01 ENCOUNTER — Other Ambulatory Visit: Payer: Self-pay | Admitting: Family Medicine

## 2020-04-03 ENCOUNTER — Telehealth (INDEPENDENT_AMBULATORY_CARE_PROVIDER_SITE_OTHER): Payer: 59 | Admitting: Family Medicine

## 2020-04-03 ENCOUNTER — Encounter: Payer: Self-pay | Admitting: Family Medicine

## 2020-04-03 ENCOUNTER — Other Ambulatory Visit: Payer: Self-pay

## 2020-04-03 DIAGNOSIS — J329 Chronic sinusitis, unspecified: Secondary | ICD-10-CM | POA: Diagnosis not present

## 2020-04-03 DIAGNOSIS — B9689 Other specified bacterial agents as the cause of diseases classified elsewhere: Secondary | ICD-10-CM

## 2020-04-03 MED ORDER — CEFDINIR 300 MG PO CAPS: 300.0000 mg | ORAL_CAPSULE | Freq: Two times a day (BID) | ORAL | 0 refills | Status: DC

## 2020-04-03 MED ORDER — PREDNISONE 20 MG PO TABS: ORAL_TABLET | ORAL | 0 refills | Status: DC

## 2020-04-03 NOTE — Progress Notes (Signed)
VIRTUAL VISIT VIA VIDEO  I connected with Nicholas Anthony on 04/03/20 at 11:30 AM EST by elemedicine application and verified that I am speaking with the correct person using two identifiers. Location patient: Home Location provider: [facility], Office Persons participating in the virtual visit: Patient, Dr. Claiborne Billings and Paulina Fusi, CMA  I discussed the limitations of evaluation and management by telemedicine and the availability of in person appointments. The patient expressed understanding and agreed to proceed.   SUBJECTIVE Chief Complaint  Patient presents with  . Cough    Pt c/o sore throat, ear fullness, head congestion, cough x 1 day; Denies fever and COVID exposure; Last COVID test 01/08 due to GI sx; pt is currently at work today    HPI: Nicholas Anthony is a 40 y.o. male present for acute illness.  Nefi states he had a GI virus last week.  He was seen in the urgent care for nausea, vomiting and diarrhea that he had had for 4 days before presenting to the urgent care.  They completed a COVID and influenza test, both were negative.  He states his symptoms completely resolved within a few days.  Yesterday morning he woke up with a sore throat, hoarseness of his voice, nasal congestion, bilateral ear pain with popping, swollen lymph nodes in his neck, sinus pressure and congestion as well as mildly productive cough.  He also states the back of his throat is very red and swollen. he denies fever, chills, nausea, vomit, diarrhea or loss of sense of taste or smell.  Has been taking Mucinex DM over-the-counter.  ROS: See pertinent positives and negatives per HPI.  Patient Active Problem List   Diagnosis Date Noted  . Allergic rhinitis 07/09/2019  . Mild intermittent asthma 04/30/2019  . Eustachian tube dysfunction, left 04/30/2019  . Orthostatic dizziness 12/14/2018  . Anal fissure 09/12/2018  . Corn of foot 05/26/2018  . Attention deficit 11/25/2017  . Mild anxiety 11/25/2017   . Ophthalmologic abnormality 09/12/2017  . Obesity, Class III, BMI 40-49.9 (morbid obesity) (HCC) 09/12/2017  . Essential hypertension 09/12/2017    Social History   Tobacco Use  . Smoking status: Never Smoker  . Smokeless tobacco: Never Used  Substance Use Topics  . Alcohol use: Never    Current Outpatient Medications:  .  albuterol (VENTOLIN HFA) 108 (90 Base) MCG/ACT inhaler, INHALE 2 PUFFS BY MOUTH EVERY 4 HOURS AS NEEDED FOR WHEEZING OR SHORTNESS OF BREATH, Disp: 8.5 g, Rfl: 1 .  amphetamine-dextroamphetamine (ADDERALL) 10 MG tablet, 1 tab BID, Disp: 180 tablet, Rfl: 0 .  cefdinir (OMNICEF) 300 MG capsule, Take 1 capsule (300 mg total) by mouth 2 (two) times daily., Disp: 20 capsule, Rfl: 0 .  ciclopirox (LOPROX) 0.77 % cream, Apply topically daily., Disp: 30 g, Rfl: 2 .  clobetasol cream (TEMOVATE) 0.05 %, Apply 1 application topically 2 (two) times daily. Apply to affected area, Disp: 60 g, Rfl: 2 .  FLUoxetine (PROZAC) 20 MG tablet, Take 2 tablets (40 mg total) by mouth daily., Disp: 180 tablet, Rfl: 1 .  fluticasone (FLONASE) 50 MCG/ACT nasal spray, USE 2 SPRAY IN EACH NOSTRIL DAILY, Disp: 16 g, Rfl: 6 .  irbesartan (AVAPRO) 75 MG tablet, 1.5 tab PO  daily, Disp: 135 tablet, Rfl: 1 .  meloxicam (MOBIC) 15 MG tablet, Take 15 mg by mouth daily., Disp: , Rfl:  .  omeprazole (PRILOSEC) 20 MG capsule, Take 1 capsule (20 mg total) by mouth daily., Disp: 90 capsule, Rfl: 3 .  terbinafine (LAMISIL) 250 MG tablet, Take 1 tablet (250 mg total) by mouth daily., Disp: 30 tablet, Rfl: 0 .  triamcinolone cream (KENALOG) 0.1 %, Apply 1 application topically 2 (two) times daily., Disp: 30 g, Rfl: 2 .  venlafaxine XR (EFFEXOR-XR) 75 MG 24 hr capsule, Take 3 capsules (225 mg total) by mouth daily with breakfast., Disp: 270 capsule, Rfl: 1 .  predniSONE (DELTASONE) 20 MG tablet, 2 tabs po qd x 5d, Disp: 10 tablet, Rfl: 0  Allergies  Allergen Reactions  . Penicillins Rash    Rash only-as a  child     OBJECTIVE: There were no vitals taken for this visit. Gen: No acute distress. Nontoxic in appearance.  HENT: AT. [place].  MMM.  Eyes:Pupils Equal Round Reactive to light, Extraocular movements intact,  Conjunctiva without redness, discharge or icterus. Chest: Cough not present on exam, no shortness of breath Skin: No rashes, purpura or petechiae.  Neuro:  Normal gait. Alert. Oriented x3  Psych: Normal affect and demeanor. Normal speech. Normal thought content and judgment.  ASSESSMENT AND PLAN: [name] is a 40 y.o. male present for  Bacterial sinusitis Eldrige had negative COVID testing 4 days into symptoms.  Believe that is an accurate result.  Current symptoms are likely pharyngitis/bacterial sinusitis status post viral infection versus strep throat.  He was not tested for strep last week.  Elected to treat without further testing giving onset of symptoms was approximately 10 days ago. Rest, hydrate.  Continue flonase, mucinex (DM if cough), nettie pot or nasal saline.  Omnicef and prednisone prescribed, take until completed.  F/U 1-2 weeks if not improved.    Felix Pacini, DO 04/03/2020   No follow-ups on file.  No orders of the defined types were placed in this encounter.  Meds ordered this encounter  Medications  . cefdinir (OMNICEF) 300 MG capsule    Sig: Take 1 capsule (300 mg total) by mouth 2 (two) times daily.    Dispense:  20 capsule    Refill:  0  . predniSONE (DELTASONE) 20 MG tablet    Sig: 2 tabs po qd x 5d    Dispense:  10 tablet    Refill:  0   Referral Orders  No referral(s) requested today

## 2020-06-19 ENCOUNTER — Other Ambulatory Visit: Payer: Self-pay | Admitting: Family Medicine

## 2020-07-01 ENCOUNTER — Other Ambulatory Visit: Payer: Self-pay | Admitting: Family Medicine

## 2020-07-01 DIAGNOSIS — F419 Anxiety disorder, unspecified: Secondary | ICD-10-CM

## 2020-07-01 DIAGNOSIS — R41840 Attention and concentration deficit: Secondary | ICD-10-CM

## 2020-07-10 ENCOUNTER — Telehealth: Payer: Self-pay

## 2020-07-10 NOTE — Telephone Encounter (Signed)
PA sent via covermymed on 07/10/20   Key: [id number]   Medication: Adderall 10mg    Dx: . Attention deficit   Per Dr. F42.395, pt has tried and failed    Waiting for response.

## 2020-07-14 NOTE — Telephone Encounter (Signed)
PA approved.

## 2020-07-21 ENCOUNTER — Ambulatory Visit: Payer: 59 | Admitting: Family Medicine

## 2020-07-21 DIAGNOSIS — R41840 Attention and concentration deficit: Secondary | ICD-10-CM

## 2020-07-21 DIAGNOSIS — I10 Essential (primary) hypertension: Secondary | ICD-10-CM

## 2020-07-21 DIAGNOSIS — F419 Anxiety disorder, unspecified: Secondary | ICD-10-CM

## 2020-07-25 ENCOUNTER — Encounter: Payer: Self-pay | Admitting: Family Medicine

## 2020-07-25 ENCOUNTER — Other Ambulatory Visit: Payer: Self-pay

## 2020-07-25 ENCOUNTER — Ambulatory Visit (INDEPENDENT_AMBULATORY_CARE_PROVIDER_SITE_OTHER): Payer: 59 | Admitting: Family Medicine

## 2020-07-25 VITALS — BP 138/85 | HR 83 | Temp 98.2°F | Ht 75.0 in | Wt 379.0 lb

## 2020-07-25 DIAGNOSIS — I10 Essential (primary) hypertension: Secondary | ICD-10-CM | POA: Diagnosis not present

## 2020-07-25 DIAGNOSIS — F419 Anxiety disorder, unspecified: Secondary | ICD-10-CM

## 2020-07-25 DIAGNOSIS — R41840 Attention and concentration deficit: Secondary | ICD-10-CM | POA: Diagnosis not present

## 2020-07-25 DIAGNOSIS — G479 Sleep disorder, unspecified: Secondary | ICD-10-CM | POA: Insufficient documentation

## 2020-07-25 MED ORDER — OMEPRAZOLE 20 MG PO CPDR: 20.0000 mg | DELAYED_RELEASE_CAPSULE | Freq: Every day | ORAL | 1 refills | Status: DC

## 2020-07-25 MED ORDER — HYDROXYZINE HCL 10 MG PO TABS: 10.0000 mg | ORAL_TABLET | Freq: Three times a day (TID) | ORAL | 1 refills | Status: DC | PRN

## 2020-07-25 MED ORDER — AMPHETAMINE-DEXTROAMPHETAMINE 10 MG PO TABS: 10.0000 mg | ORAL_TABLET | Freq: Two times a day (BID) | ORAL | 0 refills | Status: DC

## 2020-07-25 MED ORDER — VENLAFAXINE HCL ER 75 MG PO CP24: 225.0000 mg | ORAL_CAPSULE | Freq: Every day | ORAL | 1 refills | Status: DC

## 2020-07-25 MED ORDER — AMPHETAMINE-DEXTROAMPHETAMINE 10 MG PO TABS: ORAL_TABLET | ORAL | 0 refills | Status: DC

## 2020-07-25 MED ORDER — FLUOXETINE HCL 20 MG PO CAPS: 40.0000 mg | ORAL_CAPSULE | Freq: Every day | ORAL | 1 refills | Status: DC

## 2020-07-25 MED ORDER — IRBESARTAN 150 MG PO TABS: 150.0000 mg | ORAL_TABLET | Freq: Every day | ORAL | 1 refills | Status: DC

## 2020-07-25 NOTE — Progress Notes (Signed)
Patient ID: Nicholas Anthony, male  DOB: 09-14-1980, 40 y.o.   MRN: [id number] Patient Care Team    Relationship Specialty Notifications Start End  Natalia Leatherwood, DO PCP - General Family Medicine  09/12/17   Clark-Burning, Gertie Baron (Inactive)  Dermatology  10/17/19     Chief Complaint  Patient presents with  . Follow-up    CMC;   . Hypertension    Pt is not fasting     Subjective: Nicholas Anthony is a 40 y.o.  Male  present for [facility] Hypertension/morbid obesity/ophthalmological abnormality:  Patient reports compliance irbesartan 75 mg QD (1.5 tabs). Patient denies chest pain, shortness of breath, dizziness or lower extremity edema.  Pt does not take a daily baby ASA. Pt is is not prescribed statin. Diet: low sodium Exercise: encouraged routinely.  RF: HTN, obesity, Fhx heart disease Labs UTD 11/2019  ADD/Anxiety:  Patient reports his ADD and anxiety is well controlled on Effexor, Prozac and Adderall.  [place] controlled substance database reviewed 07/25/20.  He has graduated from original degree, taking more classes now for higher degree. He is having some sleep disturbances - waking more, restless. He does not feel it is related to his adderall since he has not been using during his time off classes. Prior note: Patient reports he does not feel the Effexor 225 mg daily is working as well as it has in the past for his attention.  He is also prescribed Prozac 40 mg daily.  He states the irritability and anxiety seems to be well controlled.  He is still complaining of difficulty focusing and completing tasks.  He states he does frequently forget appointments.  He endorses having a lot on his plate including working, educational classes and taking care of his family.  Depression screen [facility] 2/9 07/25/2020 02/06/2020 07/24/2019 12/14/2018 05/26/2018  Decreased Interest 0 0 0 0 0  Down, Depressed, Hopeless 0 0 0 0 0  PHQ - 2 Score 0 0 0 0 0  Altered sleeping 0 - - 3 3  Tired,  decreased energy 3 - - 0 0  Change in appetite 3 - - 0 0  Feeling bad or failure about yourself  0 - - 0 0  Trouble concentrating 0 - - 1 0  Moving slowly or fidgety/restless 0 - - 0 0  Suicidal thoughts 0 - - 0 0  PHQ-9 Score 6 - - 4 3  Difficult doing work/chores - - - Not difficult at all Not difficult at all   GAD 7 : Generalized Anxiety Score 07/25/2020 07/24/2019 12/14/2018 05/26/2018  Nervous, Anxious, on Edge 0 1 0 0  Control/stop worrying 0 0 0 0  Worry too much - different things 0 1 0 0  Trouble relaxing 0 3 0 0  Restless 0 3 0 0  Easily annoyed or irritable 0 0 0 0  Afraid - awful might happen 0 0 0 0  Total GAD 7 Score 0 8 0 0  Anxiety Difficulty - Somewhat difficult Not difficult at all Not difficult at all    Immunization History  Administered Date(s) Administered  . Influenza Split 03/30/2014  . Influenza,inj,Quad PF,6+ Mos 11/25/2017, 12/14/2018  . Tdap 03/30/2014   Past Medical History:  Diagnosis Date  . Allergy   . Anal fissure   . Asthma   . Bilateral inguinal hernia (BIH) 05/21/2019  . Chicken pox   . HTN (hypertension)   . Prediabetes 10/03/2017  . Retinal hemorrhage 09/02/2017  .  Tick bite 12/14/2018   Allergies  Allergen Reactions  . Penicillins Rash    Rash only-as a child    Past Surgical History:  Procedure Laterality Date  . SPHINCTEROTOMY  2020   Family History  Problem Relation Age of Onset  . Diabetes Father   . Heart disease Father   . Hyperlipidemia Father   . Hypertension Father   . Drug abuse Sister   . Alcohol abuse Sister   . Arthritis Paternal Grandmother   . Depression Paternal Grandmother   . Diabetes Paternal Grandmother   . Hyperlipidemia Paternal Grandmother   . Heart disease Paternal Grandmother   . Hypertension Paternal Grandmother   . Arthritis Paternal Grandfather   . Hearing loss Paternal Grandfather   . Heart disease Paternal Grandfather   . Hyperlipidemia Paternal Grandfather   . Hypertension Paternal  Grandfather   . Kidney disease Paternal Grandfather   . 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    Drinks caffeine.    Smoke alarm in the home. Wears his seatbelt.    Feels safe in his relationship.     Allergies as of 07/25/2020      Reactions   Penicillins Rash   Rash only-as a child      Medication List       Accurate as of Jul 25, 2020  1:55 PM. If you have any questions, ask your nurse or doctor.        STOP taking these medications   cefdinir 300 MG capsule Commonly known as: OMNICEF Stopped by: Felix Pacini, DO   ciclopirox 0.77 % cream Commonly known as: LOPROX Stopped by: Felix Pacini, DO   clobetasol cream 0.05 % Commonly known as: TEMOVATE Stopped by: Felix Pacini, DO   predniSONE 20 MG tablet Commonly known as: DELTASONE Stopped by: Felix Pacini, DO   terbinafine 250 MG tablet Commonly known as: LamISIL Stopped by: Felix Pacini, DO   triamcinolone cream 0.1 % Commonly known as: KENALOG Stopped by: Felix Pacini, DO     TAKE these medications   albuterol 108 (90 Base) MCG/ACT inhaler Commonly known as: VENTOLIN HFA INHALE 2 PUFFS BY MOUTH EVERY 4 HOURS AS NEEDED FOR WHEEZING OR SHORTNESS OF BREATH   amphetamine-dextroamphetamine 10 MG tablet Commonly known as: Adderall 1 tab BID What changed: Another medication with the same name was added. Make sure you understand how and when to take each. Changed by: Felix Pacini, DO   amphetamine-dextroamphetamine 10 MG tablet Commonly known as: ADDERALL Take 1 tablet (10 mg total) by mouth 2 (two) times daily. What changed: You were already taking a medication with the same name, and this prescription was added. Make sure you understand how and when to take each. Changed by: Felix Pacini, DO   FLUoxetine 20 MG capsule Commonly known as: PROZAC Take 2 capsules (40 mg total) by mouth daily.    fluticasone 50 MCG/ACT nasal spray Commonly known as: FLONASE USE 2 SPRAY IN EACH NOSTRIL DAILY   hydrOXYzine 10 MG tablet Commonly known as: ATARAX/VISTARIL Take 1 tablet (10 mg total) by mouth 3 (three) times daily as needed. Started by: Felix Pacini, DO   irbesartan 150 MG tablet Commonly known as: Avapro Take 1 tablet (150 mg total) by mouth daily. What changed:   medication strength  how much to take  how to take this  when to take  this  additional instructions Changed by: Felix Pacinienee Perlita Forbush, DO   meloxicam 15 MG tablet Commonly known as: MOBIC Take 15 mg by mouth daily.   omeprazole 20 MG capsule Commonly known as: PRILOSEC Take 1 capsule (20 mg total) by mouth daily.   venlafaxine XR 75 MG 24 hr capsule Commonly known as: EFFEXOR-XR Take 3 capsules (225 mg total) by mouth daily with breakfast.       All past medical history, surgical history, allergies, family history, immunizations andmedications were updated in the EMR today and reviewed under the history and medication portions of their EMR.      Patient was never admitted.   ROS: 14 pt review of systems performed and negative (unless mentioned in an HPI)   Objective: BP 138/85   Pulse 83   Temp 98.2 F (36.8 C) (Oral)   Ht 6\' 3"  (1.905 m)   Wt (!) 379 lb (171.9 kg)   SpO2 98%   BMI 47.37 kg/m  Gen: Afebrile. No acute distress. Obese male HENT: AT. [place].  Eyes:Pupils Equal Round Reactive to light, Extraocular movements intact,  Conjunctiva without redness, discharge or icterus. Neck/lymp/endocrine: Supple,no lymphadenopathy, no thyromegaly CV: RRR no murmur, no edema, +2/4 P posterior tibialis pulses Chest: CTAB, no wheeze or crackles Skin: no rashes, purpura or petechiae.  Neuro:  Normal gait. PERLA. EOMi. Alert. Oriented x3 Psych: Normal affect, dress and demeanor. Normal speech. Normal thought content and judgment.   No exam data present  Assessment/plan: Nicholas KelpRobert Brawn is a 40 y.o. male  present for [facility]c anxiety/Attention deficit Stable.  -continue  Effexor 225 mg daily.   -continue Prozac 40 mg daily.  -continue  Adderall 10 mg twice daily (#180, x2) - [place] controlled database reviewed 07/25/20 - Controlled substance contract signed 08/21/2019 F/u q 5.5  mos face to face required for controlled substance.   Hypertension/obesity:  Stable.  Increase  Avapro to 150 mg qd - Continue diet and weight loss regimen, routine exercise.  -Low sodium diet. Labs UTD 12/06/2019  Sleep disturbance:  Very possibly related to the q 2 weeks steroid injections he was receiving in his feet. Time line does correlate.  He also had not been taking afternoon dose of adderall since out of classes.  Trial vistaril QHS prn.   Return in about 5 months (around 01/05/2021) for CPE (30 min), [facility] (30 min). No orders of the defined types were placed in this encounter.  Meds ordered this encounter  Medications  . venlafaxine XR (EFFEXOR-XR) 75 MG 24 hr capsule    Sig: Take 3 capsules (225 mg total) by mouth daily with breakfast.    Dispense:  270 capsule    Refill:  1  . omeprazole (PRILOSEC) 20 MG capsule    Sig: Take 1 capsule (20 mg total) by mouth daily.    Dispense:  90 capsule    Refill:  1  . FLUoxetine (PROZAC) 20 MG capsule    Sig: Take 2 capsules (40 mg total) by mouth daily.    Dispense:  90 capsule    Refill:  1  . irbesartan (AVAPRO) 150 MG tablet    Sig: Take 1 tablet (150 mg total) by mouth daily.    Dispense:  90 tablet    Refill:  1  . hydrOXYzine (ATARAX/VISTARIL) 10 MG tablet    Sig: Take 1 tablet (10 mg total) by mouth 3 (three) times daily as needed.    Dispense:  90 tablet    Refill:  1  .  amphetamine-dextroamphetamine (ADDERALL) 10 MG tablet    Sig: 1 tab BID    Dispense:  180 tablet    Refill:  0    May refill after 10/18/2020  . amphetamine-dextroamphetamine (ADDERALL) 10 MG tablet    Sig: Take 1 tablet (10 mg total) by mouth 2 (two) times daily.    Dispense:   180 tablet    Refill:  0   Referral Orders  No referral(s) requested today    Electronically signed by: Felix Pacini, DO [facility] [place]

## 2020-07-25 NOTE — Patient Instructions (Addendum)
  Great to see you today.  I have refilled the medication(s) we provide.   Next appt physical and chronic conditions in October. Fasting labs at this appt.

## 2020-08-11 ENCOUNTER — Ambulatory Visit (INDEPENDENT_AMBULATORY_CARE_PROVIDER_SITE_OTHER): Payer: 59 | Admitting: Podiatry

## 2020-08-11 ENCOUNTER — Other Ambulatory Visit: Payer: Self-pay

## 2020-08-11 ENCOUNTER — Ambulatory Visit (INDEPENDENT_AMBULATORY_CARE_PROVIDER_SITE_OTHER): Payer: 59

## 2020-08-11 DIAGNOSIS — M779 Enthesopathy, unspecified: Secondary | ICD-10-CM

## 2020-08-11 DIAGNOSIS — M722 Plantar fascial fibromatosis: Secondary | ICD-10-CM

## 2020-08-15 ENCOUNTER — Telehealth: Payer: Self-pay | Admitting: Urology

## 2020-08-15 NOTE — Telephone Encounter (Signed)
ERROR

## 2020-08-22 ENCOUNTER — Telehealth: Payer: Self-pay | Admitting: Urology

## 2020-08-22 NOTE — Telephone Encounter (Signed)
DOS - 08/28/20  EPF B/L --- 59741   [employer] EFFECTIVE DATE - 03/22/20  PLAN DEDUCTIBLE - $1,200.00 W/  $48.23 REMAINING OUT OF POCKET - $3,200.00 W/ $1,803.64 REMAINING COINSURANCE - 20% COPAY - $0.00   PER [facility] SITE CPT CODE 63845 X'S 2 HAS BEEN APPROVED, AUTH # [id number].

## 2020-08-26 NOTE — Progress Notes (Signed)
   Subjective: 40 y.o. male presenting today as a new patient referral from Dr. Elijah Birk, local podiatrist for evaluation of chronic plantar fasciitis to the bilateral heels.  Patient states that he has had pain for approximately 3 years now.  Gradual onset.  He has tried multiple conservative modalities including cortisone injections, bracing, night splints, and physical therapy, custom orthotics, and oral anti-inflammatories with no improvement.  He continues to have pain and tenderness to the bilateral heels.  He was referred here for surgical consultation.  He presents for further treatment and evaluation   Past Medical History:  Diagnosis Date  . Allergy   . Anal fissure   . Asthma   . Bilateral inguinal hernia (BIH) 05/21/2019  . Chicken pox   . HTN (hypertension)   . Prediabetes 10/03/2017  . Retinal hemorrhage 09/02/2017  . Tick bite 12/14/2018     Objective: Physical Exam General: The patient is alert and oriented x3 in no acute distress.  Dermatology: Skin is warm, dry and supple bilateral lower extremities. Negative for open lesions or macerations bilateral.   Vascular: Dorsalis Pedis and Posterior Tibial pulses palpable bilateral.  Capillary fill time is immediate to all digits.  Neurological: Epicritic and protective threshold intact bilateral.   Musculoskeletal: Tenderness to palpation to the plantar aspect of the bilateral heels along the plantar fascia. All other joints range of motion within normal limits bilateral. Strength 5/5 in all groups bilateral.   Radiographic exam: Normal osseous mineralization. Joint spaces preserved. No fracture/dislocation/boney destruction. No other soft tissue abnormalities or radiopaque foreign bodies.   Assessment: 1.  Chronic plantar fasciitis bilateral feet  Plan of Care:  1. Patient evaluated. Xrays reviewed.   2. Today we discussed the conservative versus surgical management of the presenting pathology. The patient opts for surgical  management. All possible complications and details of the procedure were explained. All patient questions were answered. No guarantees were expressed or implied. 3. Authorization for surgery was initiated today. Surgery will consist of endoscopic plantar fasciotomy bilateral  *Lives in [place], [place].  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 Felecia Shelling, DPM    [address] [place], [place] [address]                Office [phone]  Fax [phone]

## 2020-08-27 ENCOUNTER — Encounter: Payer: Self-pay | Admitting: Family Medicine

## 2020-08-27 DIAGNOSIS — M722 Plantar fascial fibromatosis: Secondary | ICD-10-CM

## 2020-08-27 HISTORY — DX: Plantar fascial fibromatosis: M72.2

## 2020-08-28 ENCOUNTER — Other Ambulatory Visit: Payer: Self-pay | Admitting: Podiatry

## 2020-08-28 DIAGNOSIS — M79676 Pain in unspecified toe(s): Secondary | ICD-10-CM

## 2020-08-28 DIAGNOSIS — M722 Plantar fascial fibromatosis: Secondary | ICD-10-CM | POA: Diagnosis not present

## 2020-08-28 MED ORDER — IBUPROFEN 800 MG PO TABS: 800.0000 mg | ORAL_TABLET | Freq: Three times a day (TID) | ORAL | 1 refills | Status: DC

## 2020-08-28 MED ORDER — OXYCODONE-ACETAMINOPHEN 5-325 MG PO TABS: 1.0000 | ORAL_TABLET | ORAL | 0 refills | Status: DC | PRN

## 2020-08-28 NOTE — Progress Notes (Signed)
PRN postop 

## 2020-08-29 ENCOUNTER — Other Ambulatory Visit: Payer: Self-pay

## 2020-08-29 MED ORDER — FLUTICASONE PROPIONATE 50 MCG/ACT NA SUSP: NASAL | 3 refills | Status: DC

## 2020-09-03 ENCOUNTER — Ambulatory Visit (INDEPENDENT_AMBULATORY_CARE_PROVIDER_SITE_OTHER): Payer: 59 | Admitting: Podiatry

## 2020-09-03 ENCOUNTER — Other Ambulatory Visit: Payer: Self-pay

## 2020-09-03 DIAGNOSIS — Z9889 Other specified postprocedural states: Secondary | ICD-10-CM

## 2020-09-03 DIAGNOSIS — M722 Plantar fascial fibromatosis: Secondary | ICD-10-CM

## 2020-09-05 ENCOUNTER — Encounter: Payer: Self-pay | Admitting: Podiatry

## 2020-09-05 NOTE — Progress Notes (Signed)
Subjective:  Patient ID: Nicholas Anthony, male    DOB: 1980/07/12,  MRN: [id number]  Chief Complaint  Patient presents with   Routine Post Op    POST OP DOS 6.9.22     DOS: 08/28/2020 Procedure: Bilateral endoscopic plantar fasciotomy  40 y.o. male returns for post-op check.  Patient states is doing well.  He does not have much pain.  He is ambulating with bilateral surgical shoe.  He denies any other acute complaints.  He has not been taking any pain medication.  Review of Systems: Negative except as noted in the HPI. Denies N/V/F/Ch.  Past Medical History:  Diagnosis Date   Allergy    Anal fissure    Asthma    Bilateral inguinal hernia (BIH) 05/21/2019   Chicken pox    HTN (hypertension)    Prediabetes 10/03/2017   Retinal hemorrhage 09/02/2017   Tick bite 12/14/2018    Current Outpatient Medications:    albuterol (VENTOLIN HFA) 108 (90 Base) MCG/ACT inhaler, INHALE 2 PUFFS BY MOUTH EVERY 4 HOURS AS NEEDED FOR WHEEZING OR SHORTNESS OF BREATH, Disp: 8.5 g, Rfl: 1   amphetamine-dextroamphetamine (ADDERALL) 10 MG tablet, 1 tab BID, Disp: 180 tablet, Rfl: 0   amphetamine-dextroamphetamine (ADDERALL) 10 MG tablet, Take 1 tablet (10 mg total) by mouth 2 (two) times daily., Disp: 180 tablet, Rfl: 0   FLUoxetine (PROZAC) 20 MG capsule, Take 2 capsules (40 mg total) by mouth daily., Disp: 90 capsule, Rfl: 1   fluticasone (FLONASE) 50 MCG/ACT nasal spray, USE 2 SPRAY IN EACH NOSTRIL DAILY, Disp: 16 g, Rfl: 3   hydrOXYzine (ATARAX/VISTARIL) 10 MG tablet, Take 1 tablet (10 mg total) by mouth 3 (three) times daily as needed., Disp: 90 tablet, Rfl: 1   ibuprofen (ADVIL) 800 MG tablet, Take 1 tablet (800 mg total) by mouth 3 (three) times daily., Disp: 90 tablet, Rfl: 1   irbesartan (AVAPRO) 150 MG tablet, Take 1 tablet (150 mg total) by mouth daily., Disp: 90 tablet, Rfl: 1   meloxicam (MOBIC) 15 MG tablet, Take 15 mg by mouth daily., Disp: , Rfl:    omeprazole (PRILOSEC) 20 MG capsule, Take 1  capsule (20 mg total) by mouth daily., Disp: 90 capsule, Rfl: 1   oxyCODONE-acetaminophen (PERCOCET) 5-325 MG tablet, Take 1 tablet by mouth every 4 (four) hours as needed for severe pain., Disp: 30 tablet, Rfl: 0   venlafaxine XR (EFFEXOR-XR) 75 MG 24 hr capsule, Take 3 capsules (225 mg total) by mouth daily with breakfast., Disp: 270 capsule, Rfl: 1  Social History   Tobacco Use  Smoking Status Never  Smokeless Tobacco Never    Allergies  Allergen Reactions   Penicillins Rash    Rash only-as a child    Objective:  There were no vitals filed for this visit. There is no height or weight on file to calculate BMI. Constitutional Well developed. Well nourished.  Vascular Foot warm and well perfused. Capillary refill normal to all digits.   Neurologic Normal speech. Oriented to person, place, and time. Epicritic sensation to light touch grossly present bilaterally.  Dermatologic Skin healing well without signs of infection. Skin edges well coapted without signs of infection.  Orthopedic: Tenderness to palpation noted about the surgical site.   Radiographs: None Assessment:   1. Plantar fasciitis, bilateral   2. Status post foot surgery    Plan:  Patient was evaluated and treated and all questions answered.  S/p foot surgery bilaterally -Progressing as expected post-operatively. -XR: None -WB Status:  Weightbearing as tolerated in surgical shoe -Sutures: None.  No clinical signs of day is sinus.  No clinical signs of infection. -Medications: None -Foot redressed.  No follow-ups on file.

## 2020-09-10 ENCOUNTER — Encounter: Payer: 59 | Admitting: Podiatry

## 2020-09-24 ENCOUNTER — Other Ambulatory Visit: Payer: Self-pay

## 2020-09-24 ENCOUNTER — Ambulatory Visit (INDEPENDENT_AMBULATORY_CARE_PROVIDER_SITE_OTHER): Payer: 59 | Admitting: Podiatry

## 2020-09-24 DIAGNOSIS — Z9889 Other specified postprocedural states: Secondary | ICD-10-CM

## 2020-09-24 NOTE — Progress Notes (Signed)
   Subjective:  Patient presents today status post endoscopic plantar fasciotomy bilateral. DOS: 08/28/2020.  Patient states he continues to have some pain and tenderness with the postsurgical shoes.  He has been minimal weightbearing as instructed.  No new complaints at this time  Past Medical History:  Diagnosis Date   Allergy    Anal fissure    Asthma    Bilateral inguinal hernia (BIH) 05/21/2019   Chicken pox    HTN (hypertension)    Prediabetes 10/03/2017   Retinal hemorrhage 09/02/2017   Tick bite 12/14/2018      Objective/Physical Exam Neurovascular status intact.  Skin incisions appear to be well coapted and healed.  No sign of infectious process noted. No dehiscence. No active bleeding noted.  Minimal edema noted to the surgical extremity.   Assessment: 1. s/p endoscopic plantar fasciotomy bilateral. DOS: 08/28/2020   Plan of Care:  1. Patient was evaluated.  2.  Patient may transition out of postsurgical shoes into good supportive sneakers 3.  Continue ibuprofen 800 mg as needed 4.  Compression ankle sleeves dispensed.  Wear daily  5.  Return to clinic in 3 months  *Lives in [place], [place].  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   Felecia Shelling, DPM [facility]  Dr. Felecia Shelling, DPM    [address] [place], [place] [address]                Office [phone]  Fax [phone]

## 2020-10-30 ENCOUNTER — Other Ambulatory Visit: Payer: Self-pay

## 2020-10-30 ENCOUNTER — Encounter: Payer: Self-pay | Admitting: Family Medicine

## 2020-10-30 MED ORDER — AMPHETAMINE-DEXTROAMPHETAMINE 20 MG PO TABS: 10.0000 mg | ORAL_TABLET | Freq: Two times a day (BID) | ORAL | 0 refills | Status: DC

## 2020-10-30 MED ORDER — FLUOXETINE HCL 20 MG PO CAPS: 40.0000 mg | ORAL_CAPSULE | Freq: Every day | ORAL | 1 refills | Status: DC

## 2020-10-30 NOTE — Telephone Encounter (Signed)
Please call patient   I have called in the new prescription for his Adderall due to his pharmacy's backorder.  Please explain to him he did have 1 refill left it was his pharmacy that could not get his dosage in stock.    Therefore, I have called in Adderall 20 mg tabs.  He is to take a half a tab twice daily.  Please make sure patient is aware of the change in dosage per pill, due to his pharmacy's backorder of his usual dosage.  I did call in a 90-day supply.  Thanks. Please make sure he is aware if he does not already have an appointment scheduled he will need an appointment prior to future refills on this medication.

## 2020-10-30 NOTE — Telephone Encounter (Signed)
LVM to inform pt to read mychart message regarding medication.

## 2020-12-10 ENCOUNTER — Ambulatory Visit (INDEPENDENT_AMBULATORY_CARE_PROVIDER_SITE_OTHER): Payer: 59 | Admitting: Podiatry

## 2020-12-10 ENCOUNTER — Other Ambulatory Visit: Payer: Self-pay

## 2020-12-10 DIAGNOSIS — M722 Plantar fascial fibromatosis: Secondary | ICD-10-CM

## 2020-12-12 NOTE — Progress Notes (Addendum)
   Subjective:  Patient presents today status post endoscopic plantar fasciotomy bilateral. DOS: 08/28/2020.  Patient states that he continues to have some cramping in the arch to the heel.  He is no longer having the sharp pains that he was experiencing prior to surgery.  He is concerned with the pain in the arch.  Overall there is improvement however.  He presents for further treatment and evaluation.  Right is worse than the left.  Past Medical History:  Diagnosis Date   Allergy    Anal fissure    Asthma    Bilateral inguinal hernia (BIH) 05/21/2019   Chicken pox    HTN (hypertension)    Prediabetes 10/03/2017   Retinal hemorrhage 09/02/2017   Tick bite 12/14/2018    Objective: Physical Exam General: The patient is alert and oriented x3 in no acute distress.  Dermatology: Skin is cool, dry and supple bilateral lower extremities. Negative for open lesions or macerations.  Vascular: Palpable pedal pulses bilaterally. No edema or erythema noted. Capillary refill within normal limits.  Neurological: Epicritic and protective threshold grossly intact bilaterally.   Musculoskeletal Exam: No pedal deformities noted.  Status post EPF bilateral.  There is some pain on palpation throughout the medial longitudinal arch of the bilateral feet    Assessment: 1. s/p endoscopic plantar fasciotomy bilateral. DOS: 08/28/2020   Plan of Care:  1. Patient was evaluated.  2.  Patient continues to have pain and sensitivity associated to his bilateral feet.  Continue with restrictions.  Rest as needed.  Discourage any prolonged periods of standing or walking.  Recommend good supportive shoes and sneakers. 3.  Continue OTC Motrin 800 mg as needed 4.  Today the patient was molded for custom molded orthotics 5.  Today we will extend the patient's no work status an additional 8 weeks.  This is medically necessary since the patient continues to have some pain and sensitivity associated to his bilateral foot  surgeries 6.  Return to clinic in 8 weeks  *Lives in [place], [place].  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   Felecia Shelling, DPM [facility]  Dr. Felecia Shelling, DPM    [address] [place], [place] [address]                Office [phone]  Fax [phone]

## 2020-12-17 ENCOUNTER — Encounter: Payer: Self-pay | Admitting: Podiatry

## 2020-12-25 ENCOUNTER — Telehealth: Payer: Self-pay | Admitting: Podiatry

## 2020-12-25 NOTE — Telephone Encounter (Signed)
Mr. Quintanar said that your notes contradict the reasons he is still out of work. Could you please be more detailed in reason for patient needing his leave extended. Please amend notes. Please be sure to indicate that patient is still experiencing bilateral foot pain, unable to stand for extended periods.  . Patient was evaluated.  2.  Continue full activity no restrictions and good supportive sneakers 3.  Continue OTC Motrin 800 mg as needed 4.  Today the patient was molded for custom molded orthotics 5.  Today we will extend the patient's no work status an additional 8 weeks 6.  Return to clinic in 8 weeks

## 2020-12-26 ENCOUNTER — Telehealth: Payer: Self-pay | Admitting: Podiatry

## 2020-12-26 NOTE — Telephone Encounter (Signed)
error 

## 2020-12-29 ENCOUNTER — Telehealth: Payer: Self-pay | Admitting: *Deleted

## 2020-12-29 NOTE — Telephone Encounter (Signed)
Corrected.  Sorry about that.  Thanks, Dr. Logan Bores

## 2020-12-29 NOTE — Telephone Encounter (Signed)
Leigh w/ [employer] is  calling  [phone]) for the status of a previous form sent. Please fax information to : [phone] before Oct. 13th.

## 2020-12-31 ENCOUNTER — Encounter: Payer: 59 | Admitting: Podiatry

## 2021-01-01 ENCOUNTER — Other Ambulatory Visit: Payer: Self-pay

## 2021-01-02 ENCOUNTER — Encounter: Payer: Self-pay | Admitting: Family Medicine

## 2021-01-02 ENCOUNTER — Telehealth: Payer: Self-pay | Admitting: Family Medicine

## 2021-01-02 ENCOUNTER — Emergency Department (HOSPITAL_BASED_OUTPATIENT_CLINIC_OR_DEPARTMENT_OTHER)
Admission: EM | Admit: 2021-01-02 | Discharge: 2021-01-02 | Disposition: A | Discharge status: Home / Self Care | Payer: 59 | Attending: Emergency Medicine | Admitting: Emergency Medicine

## 2021-01-02 ENCOUNTER — Other Ambulatory Visit: Payer: Self-pay

## 2021-01-02 ENCOUNTER — Emergency Department (HOSPITAL_BASED_OUTPATIENT_CLINIC_OR_DEPARTMENT_OTHER): Payer: 59

## 2021-01-02 ENCOUNTER — Ambulatory Visit (INDEPENDENT_AMBULATORY_CARE_PROVIDER_SITE_OTHER): Payer: 59 | Admitting: Family Medicine

## 2021-01-02 VITALS — BP 129/88 | HR 76 | Temp 98.1°F | Ht 75.0 in | Wt 380.0 lb

## 2021-01-02 DIAGNOSIS — J4520 Mild intermittent asthma, uncomplicated: Secondary | ICD-10-CM | POA: Diagnosis not present

## 2021-01-02 DIAGNOSIS — Z131 Encounter for screening for diabetes mellitus: Secondary | ICD-10-CM

## 2021-01-02 DIAGNOSIS — Z7952 Long term (current) use of systemic steroids: Secondary | ICD-10-CM | POA: Insufficient documentation

## 2021-01-02 DIAGNOSIS — Z1159 Encounter for screening for other viral diseases: Secondary | ICD-10-CM

## 2021-01-02 DIAGNOSIS — R55 Syncope and collapse: Secondary | ICD-10-CM | POA: Insufficient documentation

## 2021-01-02 DIAGNOSIS — R42 Dizziness and giddiness: Secondary | ICD-10-CM | POA: Diagnosis not present

## 2021-01-02 DIAGNOSIS — Z0001 Encounter for general adult medical examination with abnormal findings: Secondary | ICD-10-CM | POA: Diagnosis not present

## 2021-01-02 DIAGNOSIS — R41840 Attention and concentration deficit: Secondary | ICD-10-CM | POA: Diagnosis not present

## 2021-01-02 DIAGNOSIS — R61 Generalized hyperhidrosis: Secondary | ICD-10-CM | POA: Insufficient documentation

## 2021-01-02 DIAGNOSIS — Z79899 Other long term (current) drug therapy: Secondary | ICD-10-CM | POA: Diagnosis not present

## 2021-01-02 DIAGNOSIS — I10 Essential (primary) hypertension: Secondary | ICD-10-CM | POA: Insufficient documentation

## 2021-01-02 DIAGNOSIS — Z23 Encounter for immunization: Secondary | ICD-10-CM | POA: Diagnosis not present

## 2021-01-02 DIAGNOSIS — F419 Anxiety disorder, unspecified: Secondary | ICD-10-CM

## 2021-01-02 DIAGNOSIS — R110 Nausea: Secondary | ICD-10-CM | POA: Diagnosis not present

## 2021-01-02 DIAGNOSIS — G479 Sleep disorder, unspecified: Secondary | ICD-10-CM

## 2021-01-02 LAB — BASIC METABOLIC PANEL
Anion gap: 8 (ref 5–15)
BUN: 12 mg/dL (ref 6–20)
CO2: 26 mmol/L (ref 22–32)
Calcium: 8.9 mg/dL (ref 8.9–10.3)
Chloride: 103 mmol/L (ref 98–111)
Creatinine, Ser: 1.04 mg/dL (ref 0.61–1.24)
GFR, Estimated: >60 mL/min (ref >60)
Glucose, Bld: 107 mg/dL — ABNORMAL HIGH (ref 70–99)
Potassium: 4.2 mmol/L (ref 3.5–5.1)
Sodium: 137 mmol/L (ref 135–145)

## 2021-01-02 LAB — CBC
HCT: 47.4 % (ref 39.0–52.0)
Hemoglobin: 16.1 g/dL (ref 13.0–17.0)
MCH: 28.5 pg (ref 26.0–34.0)
MCHC: 34 g/dL (ref 30.0–36.0)
MCV: 84 fL (ref 80.0–100.0)
Platelets: 357 10*3/uL (ref 150–400)
RBC: 5.64 MIL/uL (ref 4.22–5.81)
RDW: 13.9 % (ref 11.5–15.5)
WBC: 9.7 10*3/uL (ref 4.0–10.5)
nRBC: 0 % (ref 0.0–0.2)

## 2021-01-02 LAB — CBG MONITORING, ED: Glucose-Capillary: 125 mg/dL — ABNORMAL HIGH (ref 70–99)

## 2021-01-02 LAB — COMPREHENSIVE METABOLIC PANEL
ALT: 78 U/L — ABNORMAL HIGH (ref 0–53)
AST: 43 U/L — ABNORMAL HIGH (ref 0–37)
Albumin: 4.5 g/dL (ref 3.5–5.2)
Alkaline Phosphatase: 58 U/L (ref 39–117)
BUN: 11 mg/dL (ref 6–23)
CO2: 24 mEq/L (ref 19–32)
Calcium: 9.3 mg/dL (ref 8.4–10.5)
Chloride: 103 mEq/L (ref 96–112)
Creatinine, Ser: 0.93 mg/dL (ref 0.40–1.50)
GFR: 103.39 mL/min (ref >60.00)
Glucose, Bld: 93 mg/dL (ref 70–99)
Potassium: 4.3 mEq/L (ref 3.5–5.1)
Sodium: 137 mEq/L (ref 135–145)
Total Bilirubin: 0.6 mg/dL (ref 0.2–1.2)
Total Protein: 7.1 g/dL (ref 6.0–8.3)

## 2021-01-02 LAB — LIPID PANEL
Cholesterol: 198 mg/dL (ref 0–200)
HDL: 37.8 mg/dL — ABNORMAL LOW (ref >39.00)
LDL Cholesterol: 134 mg/dL — ABNORMAL HIGH (ref 0–99)
NonHDL: 160.58
Total CHOL/HDL Ratio: 5
Triglycerides: 135 mg/dL (ref 0.0–149.0)
VLDL: 27 mg/dL (ref 0.0–40.0)

## 2021-01-02 LAB — TSH: TSH: 0.8 u[IU]/mL (ref 0.35–5.50)

## 2021-01-02 MED ORDER — ALBUTEROL SULFATE HFA 108 (90 BASE) MCG/ACT IN AERS: INHALATION_SPRAY | RESPIRATORY_TRACT | 1 refills | Status: DC

## 2021-01-02 MED ORDER — GABAPENTIN 100 MG PO CAPS: ORAL_CAPSULE | ORAL | 5 refills | Status: DC

## 2021-01-02 MED ORDER — OMEPRAZOLE 20 MG PO CPDR: 20.0000 mg | DELAYED_RELEASE_CAPSULE | Freq: Every day | ORAL | 1 refills | Status: DC

## 2021-01-02 MED ORDER — AMPHETAMINE-DEXTROAMPHETAMINE 20 MG PO TABS: 10.0000 mg | ORAL_TABLET | Freq: Two times a day (BID) | ORAL | 0 refills | Status: DC

## 2021-01-02 MED ORDER — IRBESARTAN 150 MG PO TABS: 150.0000 mg | ORAL_TABLET | Freq: Every day | ORAL | 1 refills | Status: DC

## 2021-01-02 MED ORDER — FLUTICASONE PROPIONATE 50 MCG/ACT NA SUSP: NASAL | 3 refills | Status: DC

## 2021-01-02 MED ORDER — VENLAFAXINE HCL ER 75 MG PO CP24: 225.0000 mg | ORAL_CAPSULE | Freq: Every day | ORAL | 1 refills | Status: DC

## 2021-01-02 MED ORDER — FLUOXETINE HCL 20 MG PO CAPS: 40.0000 mg | ORAL_CAPSULE | Freq: Every day | ORAL | 1 refills | Status: DC

## 2021-01-02 NOTE — ED Notes (Signed)
Pt felt like he was going to pass out again, maybe from blood draw, will have to wait until roomed.

## 2021-01-02 NOTE — ED Notes (Signed)
Pt to CT

## 2021-01-02 NOTE — Telephone Encounter (Signed)
Patient was seen today for his physical and chronic medical conditions. He received his flu shot prior today. Patient was then taken to the lab for routine lab draw.  Patient unfortunately had a syncopal episode during lab draw.  He states he has felt off in the past with lab draws and had near syncope episodes but has never passed out with lab draw.  Provider was called to the lab to assist and evaluate. Patient was diaphoretic, conscious but not responding while attempting to vomit, pale.  While attempting to vomit he became rigid in his arms and legs.  His head went back into an extended position.  His lips became blue and he was biting his tongue.  Eyes rolled back into his head.  This occurred for approximately 45 seconds - 60 seconds.  During this time provider and staff was attempting to ease him out of the chair to the floor to assess for need of CPR.  EMS was called by staff.  Sternal rub initiated during movement.  As patient was being laid back and positioned on the floor he opens his eyes and unlocked his jaw.  Color returned to lips.  He remained pale and diaphoretic.  For an additional 15 seconds he was looking at this provider without responding.  He then came to and knew where he was and was able to speak.  No bladder or bowel incontinence. The following vitals were taken after patient was repositioned on the floor, he was coherent at this time. BP: 96/68, heart rate 71, pulse ox 97% Mouth: Small amount of dried blood left side of tongue, no obvious deep laceration. Cardiac: RRR, no murmur. Lungs: CTA -B, no wheezing, crackles or rhonchi Patient remained stable although diaphoretic and pale, able to speak  EMS arrival- Patient was taken to emergency room for full evaluation. There is a concern patient had a seizure and for possible aspiration during event.   Electronically Signed by: Felix Pacini, DO [facility] [place]

## 2021-01-02 NOTE — ED Triage Notes (Signed)
Pt presents to ED BIB [facility]. Pt c/o syncopal episode. Pt reports that he was getting blood drawn at pcp and had the episode. Pcp was concerned that there was "seizure-like" activity when pt had episode. But he did not have postictal phase. Was A&O after episode. PCP also concerned that he aspirated but pt did not taste and vomit. But did but edge of tongue.  EMS VS -  128/80 HR - 70 98% RA CBG - 118

## 2021-01-02 NOTE — Patient Instructions (Signed)
Great to see you today.  I have refilled the medication(s) we provide.   If labs were collected, we will inform you of lab results once received either by echart message or telephone call.   - echart message- for normal results that have been seen by the patient already.   - telephone call: abnormal results or if patient has not viewed results in their echart.     If you are age 39 or younger, your body mass index should be between 19-25. Your Body mass index is Body mass index is 47.5 kg/m. Marland Kitchen If this is above the aformentioned range listed, you are consider overwieght and obese if BMI > 30, by medical definition and standards. Routine daily exercise and dietary modifications are encouraged. If you would like additional guidance on weight loss, please make an appointment for weight loss counseling - must be an appointment dedicate to weight loss counseling alone. I would be happy to help you.  Health Maintenance, Male Adopting a healthy lifestyle and getting preventive care are important in promoting health and wellness. Ask your health care provider about: The right schedule for you to have regular tests and exams. Things you can do on your own to prevent diseases and keep yourself healthy. What should I know about diet, weight, and exercise? Eat a healthy diet  Eat a diet that includes plenty of vegetables, fruits, low-fat dairy products, and lean protein. Do not eat a lot of foods that are high in solid fats, added sugars, or sodium. Maintain a healthy weight Body mass index (BMI) is a measurement that can be used to identify possible weight problems. It estimates body fat based on height and weight. Your health care provider can help determine your BMI and help you achieve or maintain a healthy weight. Get regular exercise Get regular exercise. This is one of the most important things you can do for your health. Most adults should: Exercise for at least 150 minutes each week. The  exercise should increase your heart rate and make you sweat (moderate-intensity exercise). Do strengthening exercises at least twice a week. This is in addition to the moderate-intensity exercise. Spend less time sitting. Even light physical activity can be beneficial. Watch cholesterol and blood lipids Have your blood tested for lipids and cholesterol at 39 years of age, then have this test every 5 years. You may need to have your cholesterol levels checked more often if: Your lipid or cholesterol levels are high. You are older than 39 years of age. You are at high risk for heart disease. What should I know about cancer screening? Many types of cancers can be detected early and may often be prevented. Depending on your health history and family history, you may need to have cancer screening at various ages. This may include screening for: Colorectal cancer. Prostate cancer. Skin cancer. Lung cancer. What should I know about heart disease, diabetes, and high blood pressure? Blood pressure and heart disease High blood pressure causes heart disease and increases the risk of stroke. This is more likely to develop in people who have high blood pressure readings, are of African descent, or are overweight. Talk with your health care provider about your target blood pressure readings. Have your blood pressure checked: Every 3-5 years if you are 54-13 years of age. Every year if you are 71 years old or older. If you are between the ages of 48 and 62 and are a current or former smoker, ask your health care provider  if you should have a one-time screening for abdominal aortic aneurysm (AAA). Diabetes Have regular diabetes screenings. This checks your fasting blood sugar level. Have the screening done: Once every three years after age 84 if you are at a normal weight and have a low risk for diabetes. More often and at a younger age if you are overweight or have a high risk for diabetes. What should I  know about preventing infection? Hepatitis B If you have a higher risk for hepatitis B, you should be screened for this virus. Talk with your health care provider to find out if you are at risk for hepatitis B infection. Hepatitis C Blood testing is recommended for: Everyone born from [birth year] through 1964. Anyone with known risk factors for hepatitis C. Sexually transmitted infections (STIs) You should be screened each year for STIs, including gonorrhea and chlamydia, if: You are sexually active and are younger than 39 years of age. You are older than 39 years of age and your health care provider tells you that you are at risk for this type of infection. Your sexual activity has changed since you were last screened, and you are at increased risk for chlamydia or gonorrhea. Ask your health care provider if you are at risk. Ask your health care provider about whether you are at high risk for HIV. Your health care provider may recommend a prescription medicine to help prevent HIV infection. If you choose to take medicine to prevent HIV, you should first get tested for HIV. You should then be tested every 3 months for as long as you are taking the medicine. Follow these instructions at home: Lifestyle Do not use any products that contain nicotine or tobacco, such as cigarettes, e-cigarettes, and chewing tobacco. If you need help quitting, ask your health care provider. Do not use street drugs. Do not share needles. Ask your health care provider for help if you need support or information about quitting drugs. Alcohol use Do not drink alcohol if your health care provider tells you not to drink. If you drink alcohol: Limit how much you have to 0-2 drinks a day. Be aware of how much alcohol is in your drink. In the [place] one drink equals one 12 oz bottle of beer (355 mL), one 5 oz glass of wine (148 mL), or one 1 oz glass of hard liquor (44 mL). General instructions Schedule regular health, dental,  and eye exams. Stay current with your vaccines. Tell your health care provider if: You often feel depressed. You have ever been abused or do not feel safe at home. Summary Adopting a healthy lifestyle and getting preventive care are important in promoting health and wellness. Follow your health care provider's instructions about healthy diet, exercising, and getting tested or screened for diseases. Follow your health care provider's instructions on monitoring your cholesterol and blood pressure. This information is not intended to replace advice given to you by your health care provider. Make sure you discuss any questions you have with your health care provider. Document Revised: 05/16/2020 Document Reviewed: 03/01/2018 Elsevier Patient Education  2022 [employer].

## 2021-01-02 NOTE — Progress Notes (Signed)
This visit occurred during the SARS-CoV-2 public health emergency.  Safety protocols were in place, including screening questions prior to the visit, additional usage of staff PPE, and extensive cleaning of exam room while observing appropriate contact time as indicated for disinfecting solutions.    Patient ID: Nicholas Anthony, male  DOB: February 21, 1981, 40 y.o.   MRN: [id number] Patient Care Team    Relationship Specialty Notifications Start End  Ma Hillock, DO PCP - General Family Medicine  09/12/17   Clark-Burning, Alcide Goodness (Inactive)  Dermatology  10/17/19     Chief Complaint  Patient presents with   Annual Exam    Pt is fasting    Subjective:  Nicholas Anthony is a 40 y.o. male present for CPE. All past medical history, surgical history, allergies, family history, immunizations, medications and social history were updated in the electronic medical record today. All recent labs, ED visits and hospitalizations within the last year were reviewed.  Health maintenance:  Colonoscopy: No fhx. Routine screen 46 Immunizations: tdap UTD 2016, Influenza provided today (encouraged yearly).  Declined COVID vaccines Infectious disease screening: HIV declined.  Agreeable to hepatitis C testing PSA: no fhx. Routine screen.No results found for: PSA, pt was counseled on prostate cancer screenings.  Assistive device: None Oxygen use: None Patient has a Dental home. Hospitalizations/ED visits: Reviewed  Hypertension/morbid obesity/ophthalmological abnormality:  Patient reports compliance irbesartan 75 mg QD (1.5 tabs). Patient denies chest pain, shortness of breath, dizziness or lower extremity edema.  Pt does not take a  daily baby ASA. Pt is is not  prescribed statin. Diet: low sodium Exercise: encouraged routinely.  RF: HTN, obesity, Fhx heart disease Labs UTD 11/2019   ADD/Anxiety:  Patient reports his ADD and anxiety is well controlled on Effexor, Prozac and Adderall.  [place]  controlled substance database reviewed 07/25/20.  He has graduated from original degree, taking more classes now for higher degree. He is having some sleep disturbances - waking more, restless. He does not feel it is related to his adderall since he has not been using during his time off classes. Prior note: Patient reports he does not feel the Effexor 225 mg daily is working as well as it has in the past for his attention.  He is also prescribed Prozac 40 mg daily.  He states the irritability and anxiety seems to be well controlled.  He is still complaining of difficulty focusing and completing tasks.  He states he does frequently forget appointments.  He endorses having a lot on his plate including working, educational classes and taking care of his fa Depression screen [facility] 2/9 01/02/2021 07/25/2020 02/06/2020 07/24/2019 12/14/2018  Decreased Interest 0 0 0 0 0  Down, Depressed, Hopeless 0 0 0 0 0  PHQ - 2 Score 0 0 0 0 0  Altered sleeping - 0 - - 3  Tired, decreased energy - 3 - - 0  Change in appetite - 3 - - 0  Feeling bad or failure about yourself  - 0 - - 0  Trouble concentrating - 0 - - 1  Moving slowly or fidgety/restless - 0 - - 0  Suicidal thoughts - 0 - - 0  PHQ-9 Score - 6 - - 4  Difficult doing work/chores - - - - Not difficult at all   GAD 7 : Generalized Anxiety Score 07/25/2020 07/24/2019 12/14/2018 05/26/2018  Nervous, Anxious, on Edge 0 1 0 0  Control/stop worrying 0 0 0 0  Worry too much - different  things 0 1 0 0  Trouble relaxing 0 3 0 0  Restless 0 3 0 0  Easily annoyed or irritable 0 0 0 0  Afraid - awful might happen 0 0 0 0  Total GAD 7 Score 0 8 0 0  Anxiety Difficulty - Somewhat difficult Not difficult at all Not difficult at all         Fall Risk  09/12/2017  Falls in the past year? No      Immunization History  Administered Date(s) Administered   Influenza Split 03/30/2014   Influenza,inj,Quad PF,6+ Mos 11/25/2017, 12/14/2018, 01/02/2021   Tdap 03/30/2014      Past Medical History:  Diagnosis Date   Allergy    Anal fissure    Asthma    Bilateral inguinal hernia (BIH) 05/21/2019   Chicken pox    HTN (hypertension)    Prediabetes 10/03/2017   Retinal hemorrhage 09/02/2017   Tick bite 12/14/2018   Allergies  Allergen Reactions   Penicillins Rash    Rash only-as a child    Past Surgical History:  Procedure Laterality Date   PLANTAR FASCIA RELEASE     SPHINCTEROTOMY  2020   Family History  Problem Relation Age of Onset   Diabetes Father    Heart disease Father    Hyperlipidemia Father    Hypertension Father    Drug abuse Sister    Alcohol abuse Sister    Arthritis Paternal Grandmother    Depression Paternal Grandmother    Diabetes Paternal Grandmother    Hyperlipidemia Paternal Grandmother    Heart disease Paternal Grandmother    Hypertension Paternal Grandmother    Arthritis Paternal Grandfather    Hearing loss Paternal Grandfather    Heart disease Paternal Grandfather    Hyperlipidemia Paternal Grandfather    Hypertension Paternal Grandfather    Kidney disease Paternal Grandfather    Drug abuse Brother    Social History   Social History Narrative   Married, employed,    In Secretary/administrator. Currently working as a Engineer, building services.    Drinks caffeine.    Smoke alarm in the home. Wears his seatbelt.    Feels safe in his relationship.     Allergies as of 01/02/2021       Reactions   Penicillins Rash   Rash only-as a child        Medication List        Accurate as of January 02, 2021 10:41 AM. If you have any questions, ask your nurse or doctor.          STOP taking these medications    amphetamine-dextroamphetamine 10 MG tablet Commonly known as: ADDERALL Replaced by: amphetamine-dextroamphetamine 20 MG tablet You also have another medication with the same name that you need to continue taking as instructed. Stopped by: Howard Pouch, DO   meloxicam 15 MG tablet Commonly known as: MOBIC Stopped by:  Howard Pouch, DO   oxyCODONE-acetaminophen 5-325 MG tablet Commonly known as: Percocet Stopped by: Howard Pouch, DO       TAKE these medications    albuterol 108 (90 Base) MCG/ACT inhaler Commonly known as: VENTOLIN HFA INHALE 2 PUFFS BY MOUTH EVERY 4 HOURS AS NEEDED FOR WHEEZING OR SHORTNESS OF BREATH   amphetamine-dextroamphetamine 20 MG tablet Commonly known as: ADDERALL Take 0.5 tablets (10 mg total) by mouth 2 (two) times daily. What changed: You were already taking a medication with the same name, and this prescription was added. Make sure you understand how and  when to take each. Replaces: amphetamine-dextroamphetamine 10 MG tablet Changed by: Howard Pouch, DO   amphetamine-dextroamphetamine 20 MG tablet Commonly known as: Adderall Take 0.5 tablets (10 mg total) by mouth in the morning and at bedtime. What changed:  Another medication with the same name was added. Make sure you understand how and when to take each. Another medication with the same name was removed. Continue taking this medication, and follow the directions you see here. Changed by: Howard Pouch, DO   FLUoxetine 20 MG capsule Commonly known as: PROZAC Take 2 capsules (40 mg total) by mouth daily.   fluticasone 50 MCG/ACT nasal spray Commonly known as: FLONASE USE 2 SPRAY IN EACH NOSTRIL DAILY   gabapentin 100 MG capsule Commonly known as: NEURONTIN 1-3 capsule qhs. Started by: Howard Pouch, DO   hydrOXYzine 10 MG tablet Commonly known as: ATARAX/VISTARIL Take 1 tablet (10 mg total) by mouth 3 (three) times daily as needed.   ibuprofen 800 MG tablet Commonly known as: ADVIL Take 1 tablet (800 mg total) by mouth 3 (three) times daily.   irbesartan 150 MG tablet Commonly known as: Avapro Take 1 tablet (150 mg total) by mouth daily.   omeprazole 20 MG capsule Commonly known as: PRILOSEC Take 1 capsule (20 mg total) by mouth daily.   venlafaxine XR 75 MG 24 hr capsule Commonly known as:  EFFEXOR-XR Take 3 capsules (225 mg total) by mouth daily with breakfast.       All past medical history, surgical history, allergies, family history, immunizations andmedications were updated in the EMR today and reviewed under the history and medication portions of their EMR.     Recent Results (from the past 2160 hour(s))  CBG monitoring, ED     Status: Abnormal   Collection Time: 01/02/21  9:59 AM  Result Value Ref Range   Glucose-Capillary 125 (H) 70 - 99 mg/dL    Comment: Glucose reference range applies only to samples taken after fasting for at least 8 hours.    No results found.   ROS: 14 pt review of systems performed and negative (unless mentioned in an HPI)  Objective: BP 129/88   Pulse 76   Temp 98.1 F (36.7 C) (Oral)   Ht _0  (1.905 m)   Wt (!) 380 lb (172.4 kg)   SpO2 98%   BMI 47.50 kg/m  Gen: Afebrile. No acute distress. Nontoxic in appearance, well-developed, well-nourished, very pleasant obese male HENT: AT. [place]. Bilateral TM visualized and normal in appearance, normal external auditory canal. MMM, no oral lesions, adequate dentition. Bilateral nares within normal limits. Throat without erythema, ulcerations or exudates.  No cough on exam, no hoarseness on exam. Eyes:Pupils Equal Round Reactive to light, Extraocular movements intact,  Conjunctiva without redness, discharge or icterus. Neck/lymp/endocrine: Supple, no lymphadenopathy, no thyromegaly CV: RRR no murmur, no edema, +2/4 P posterior tibialis pulses.  Chest: CTAB, no wheeze, rhonchi or crackles.  Normal respiratory effort.  Good air movement. Abd: Soft.  Obese. NTND. BS present.  No masses palpated. No hepatosplenomegaly. No rebound tenderness or guarding. Skin: No rashes, purpura or petechiae. Warm and well-perfused. Skin intact. Neuro/Msk:  Normal gait. PERLA. EOMi. Alert. Oriented x3.  Cranial nerves II through XII intact. Muscle strength 5/5 upper/lower extremity. DTRs equal bilaterally. Psych:  Normal affect, dress and demeanor. Normal speech. Normal thought content and judgment.  No results found.  Assessment/plan: Krishay Faro is a 40 y.o. male present for CPE/CMC anxiety/Attention deficit Stable Continue Effexor 225 mg daily.  Continue Prozac 40 mg daily.  Continue Adderall 10 mg twice daily (#180, x2) - [place] controlled database reviewed  01/02/21 - Controlled substance contract signed 08/21/2019 F/u q 5.5  mos face to face required for controlled substance.    Hypertension/obesity:  Stable Continue Avapro to 150 mg qd - Continue diet and weight loss regimen, routine exercise.  -Low sodium diet. -CBC, CMP, TSH and lipids collected today   Sleep disturbance/restless leg/feet pain:  Start gabapentin 100-300 mg nightly.  Tapering instructions provided.  If works well for her feet discomfort may consider twice daily-3 times daily dosing DC vistaril QHS prn.  Influenza vaccine administered today Need for hepatitis C screening test - Hepatitis C antibody Diabetes mellitus screening - Hemoglobin A1c  Encounter for general adult medical examination with abnormal findings Colonoscopy: No fhx. Routine screen 46 Immunizations: tdap UTD 2016, Influenza provided today (encouraged yearly).  Declined COVID vaccines Infectious disease screening: HIV declined.  Agreeable to hepatitis C testing PSA: no fhx. Routine screen.No results found for: PSA, pt was  Patient was encouraged to exercise greater than 150 minutes a week. Patient was encouraged to choose a diet filled with fresh fruits and vegetables, and lean meats. AVS provided to patient today for education/recommendation on gender specific health and safety maintenance.    *Patient had a syncopal event with seizure-like activity during his lab appointment.  EMS had been called and patient sent to ED.  Please see additional note generated with Today's date for details. > 50 Minutes was dedicated to this patient's encounter to  include pre-visit review of chart, face-to-face time with patient covering both preventative physical, immunizations, full physical exam, covering multiple chronic conditions and prescription refills along with providing treatment for an emergent syncopal episode with seizure-like activity with his lab collection today.  Full documentation in separate note of today's date.  EMS was dispatched and patient was taken to ED for for evaluation.    Return in about 24 weeks (around 06/19/2021) for CMC (30 min).  Orders Placed This Encounter  Procedures   Flu Vaccine QUAD 6+ mos PF IM (Fluarix Quad PF)   Hepatitis C antibody   CBC   Comp Met (CMET)   TSH   Lipid panel   Hemoglobin A1c   Meds ordered this encounter  Medications   irbesartan (AVAPRO) 150 MG tablet    Sig: Take 1 tablet (150 mg total) by mouth daily.    Dispense:  90 tablet    Refill:  1   omeprazole (PRILOSEC) 20 MG capsule    Sig: Take 1 capsule (20 mg total) by mouth daily.    Dispense:  90 capsule    Refill:  1   venlafaxine XR (EFFEXOR-XR) 75 MG 24 hr capsule    Sig: Take 3 capsules (225 mg total) by mouth daily with breakfast.    Dispense:  270 capsule    Refill:  1   FLUoxetine (PROZAC) 20 MG capsule    Sig: Take 2 capsules (40 mg total) by mouth daily.    Dispense:  180 capsule    Refill:  1   fluticasone (FLONASE) 50 MCG/ACT nasal spray    Sig: USE 2 SPRAY IN EACH NOSTRIL DAILY    Dispense:  16 g    Refill:  3   albuterol (VENTOLIN HFA) 108 (90 Base) MCG/ACT inhaler    Sig: INHALE 2 PUFFS BY MOUTH EVERY 4 HOURS AS NEEDED FOR WHEEZING OR SHORTNESS OF BREATH    Dispense:  8.5  g    Refill:  1   gabapentin (NEURONTIN) 100 MG capsule    Sig: 1-3 capsule qhs.    Dispense:  90 capsule    Refill:  5   amphetamine-dextroamphetamine (ADDERALL) 20 MG tablet    Sig: Take 0.5 tablets (10 mg total) by mouth 2 (two) times daily.    Dispense:  90 tablet    Refill:  0   amphetamine-dextroamphetamine (ADDERALL) 20 MG  tablet    Sig: Take 0.5 tablets (10 mg total) by mouth in the morning and at bedtime.    Dispense:  90 tablet    Refill:  0    May refill ~85d after prescription date   Referral Orders  No referral(s) requested today     Note is dictated utilizing voice recognition software. Although note has been proof read prior to signing, occasional typographical errors still can be missed. If any questions arise, please do not hesitate to call for verification.  Electronically signed by: Howard Pouch, DO [place]

## 2021-01-02 NOTE — ED Provider Notes (Signed)
[facility] EMERGENCY DEPARTMENT Provider Note   CSN: [id number] Arrival date & time: 01/02/21  0945     History Chief Complaint  Patient presents with   Loss of Consciousness    Nicholas Anthony is a 40 y.o. male.  HPI     Was getting blood drawn, started to feel lightheaded Next thing he knew, he was on the floor Dr. York Spaniel he was out for over a minute, tensed up, ?seizure like activity Has had lightheadedness, nausea, cold sweats with blood draws in the past, but never passed out First time passed out Thought he maybe vomited Bit tongue No loss of control of bowel or bladder No chest pain or dyspnea   Past Medical History:  Diagnosis Date   Allergy    Anal fissure    Asthma    Bilateral inguinal hernia (BIH) 05/21/2019   Chicken pox    HTN (hypertension)    Prediabetes 10/03/2017   Retinal hemorrhage 09/02/2017   Tick bite 12/14/2018    Patient Active Problem List   Diagnosis Date Noted   Plantar fasciitis, chronic 08/27/2020   Sleep disturbance 07/25/2020   Allergic rhinitis 07/09/2019   Mild intermittent asthma 04/30/2019   Eustachian tube dysfunction, left 04/30/2019   Orthostatic dizziness 12/14/2018   Anal fissure 09/12/2018   Corn of foot 05/26/2018   Attention deficit 11/25/2017   Mild anxiety 11/25/2017   Ophthalmologic abnormality 09/12/2017   Obesity, Class III, BMI 40-49.9 (morbid obesity) (HCC) 09/12/2017   Essential hypertension 09/12/2017    Past Surgical History:  Procedure Laterality Date   PLANTAR FASCIA RELEASE     SPHINCTEROTOMY  2020       Family History  Problem Relation Age of Onset   Diabetes Father    Heart disease Father    Hyperlipidemia Father    Hypertension Father    Drug abuse Sister    Alcohol abuse Sister    Arthritis Paternal Grandmother    Depression Paternal Grandmother    Diabetes Paternal Grandmother    Hyperlipidemia Paternal Grandmother    Heart disease Paternal Grandmother    Hypertension  Paternal Grandmother    Arthritis Paternal Grandfather    Hearing loss Paternal Grandfather    Heart disease Paternal Grandfather    Hyperlipidemia Paternal Grandfather    Hypertension Paternal Grandfather    Kidney disease Paternal Grandfather    Drug abuse Brother     Social History   Tobacco Use   Smoking status: Never   Smokeless tobacco: Never  Vaping Use   Vaping Use: Never used  Substance Use Topics   Alcohol use: Never   Drug use: Never    Home Medications Prior to Admission medications   Medication Sig Start Date End Date Taking? Authorizing Provider  albuterol (VENTOLIN HFA) 108 (90 Base) MCG/ACT inhaler INHALE 2 PUFFS BY MOUTH EVERY 4 HOURS AS NEEDED FOR WHEEZING OR SHORTNESS OF BREATH 01/02/21   Kuneff, Renee A, DO  amphetamine-dextroamphetamine (ADDERALL) 20 MG tablet Take 0.5 tablets (10 mg total) by mouth 2 (two) times daily. 01/02/21   Kuneff, Renee A, DO  amphetamine-dextroamphetamine (ADDERALL) 20 MG tablet Take 0.5 tablets (10 mg total) by mouth in the morning and at bedtime. 01/02/21   Kuneff, Renee A, DO  FLUoxetine (PROZAC) 20 MG capsule Take 2 capsules (40 mg total) by mouth daily. 01/02/21   Kuneff, Renee A, DO  fluticasone (FLONASE) 50 MCG/ACT nasal spray USE 2 SPRAY IN EACH NOSTRIL DAILY 01/02/21   Kuneff, Renee A,  DO  gabapentin (NEURONTIN) 100 MG capsule 1-3 capsule qhs. 01/02/21   Kuneff, Renee A, DO  hydrOXYzine (ATARAX/VISTARIL) 10 MG tablet Take 1 tablet (10 mg total) by mouth 3 (three) times daily as needed. 07/25/20   Kuneff, Renee A, DO  ibuprofen (ADVIL) 800 MG tablet Take 1 tablet (800 mg total) by mouth 3 (three) times daily. 08/28/20   Felecia Shelling, DPM  irbesartan (AVAPRO) 150 MG tablet Take 1 tablet (150 mg total) by mouth daily. 01/02/21   Kuneff, Renee A, DO  omeprazole (PRILOSEC) 20 MG capsule Take 1 capsule (20 mg total) by mouth daily. 01/02/21   Kuneff, Renee A, DO  venlafaxine XR (EFFEXOR-XR) 75 MG 24 hr capsule Take 3 capsules (225 mg  total) by mouth daily with breakfast. 01/02/21   Kuneff, Renee A, DO    Allergies    Penicillins  Review of Systems   Review of Systems  Constitutional:  Negative for fever.  HENT:  Negative for sore throat.   Eyes:  Negative for visual disturbance.  Respiratory:  Negative for cough and shortness of breath.   Cardiovascular:  Negative for chest pain.  Gastrointestinal:  Positive for nausea. Negative for abdominal pain and vomiting.  Genitourinary:  Negative for difficulty urinating.  Musculoskeletal:  Negative for back pain and neck stiffness.  Skin:  Negative for rash.  Neurological:  Positive for syncope, light-headedness and headaches (started yesterday, doesn't usually have them6/10). Negative for facial asymmetry, speech difficulty, weakness and numbness.   Physical Exam Updated Vital Signs BP 121/63 (BP Location: Right Arm)   Pulse (!) 53   Temp 98.1 F (36.7 C) (Oral)   Resp 14   Ht 6\' 3"  (1.905 m)   Wt (!) 172.4 kg   SpO2 100%   BMI 47.50 kg/m   Physical Exam Vitals and nursing note reviewed.  Constitutional:      General: He is not in acute distress.    Appearance: He is well-developed. He is not diaphoretic.  HENT:     Head: Normocephalic and atraumatic.  Eyes:     Conjunctiva/sclera: Conjunctivae normal.  Cardiovascular:     Rate and Rhythm: Normal rate and regular rhythm.     Heart sounds: Normal heart sounds. No murmur heard.   No friction rub. No gallop.  Pulmonary:     Effort: Pulmonary effort is normal. No respiratory distress.     Breath sounds: Normal breath sounds. No wheezing or rales.  Abdominal:     General: There is no distension.     Palpations: Abdomen is soft.     Tenderness: There is no abdominal tenderness. There is no guarding.  Musculoskeletal:     Cervical back: Normal range of motion.  Skin:    General: Skin is warm and dry.  Neurological:     Mental Status: He is alert and oriented to person, place, and time.    ED Results /  Procedures / Treatments   Labs (all labs ordered are listed, but only abnormal results are displayed) Labs Reviewed  BASIC METABOLIC PANEL - Abnormal; Notable for the following components:      Result Value   Glucose, Bld 107 (*)    All other components within normal limits  CBG MONITORING, ED - Abnormal; Notable for the following components:   Glucose-Capillary 125 (*)    All other components within normal limits  CBC    EKG EKG Interpretation  Date/Time:  Friday January 02 2021 10:00:41 EDT Ventricular Rate:  76  PR Interval:  148 QRS Duration: 94 QT Interval:  376 QTC Calculation: 423 R Axis:   63 Text Interpretation: Normal sinus rhythm Normal ECG No previous ECGs available Confirmed by Alvira Monday ([id number]) on 01/02/2021 1:06:05 PM  Radiology CT Head Wo Contrast  Result Date: 01/02/2021 CLINICAL DATA:  40 year old male with history of seizure. Abnormal neurologic examination. EXAM: CT HEAD WITHOUT CONTRAST TECHNIQUE: Contiguous axial images were obtained from the base of the skull through the vertex without intravenous contrast. COMPARISON:  No priors. FINDINGS: Brain: No evidence of acute infarction, hemorrhage, hydrocephalus, extra-axial collection or mass lesion/mass effect. Vascular: No hyperdense vessel or unexpected calcification. Skull: Normal. Negative for fracture or focal lesion. Sinuses/Orbits: No acute finding. Other: None. IMPRESSION: 1. No acute intracranial abnormalities. The appearance of the brain is normal. Electronically Signed   By: Trudie Reed M.D.   On: 01/02/2021 12:18    Procedures Procedures   Medications Ordered in ED Medications - No data to display  ED Course  I have reviewed the triage vital signs and the nursing notes.  Pertinent labs & imaging results that were available during my care of the patient were reviewed by me and considered in my medical decision making (see chart for details).    MDM Rules/Calculators/A&P                            40 year old male with a history of asthma, hypertension, prediabetes, ADD, presents with concern for syncopal episode while having his blood drawn at his PCPs today.  Reports he had prior lightheadedness with blood draws, but has never had a syncopal episode.  Per PCP, his body stiffened up, similar to seizure-like activity prior to waking.  Denies chest pain, shortness of breath, or other concerns.  EKG was evaluated me and shows no evidence of clinically significant acute abnormalities.  Given concern for possible new seizure, CT head was obtained which showed no evidence of acute intracranial abnormality.  Labs were obtained which showed normal electrolytes, no anemia.  Does not have history to suggest sepsis, ACS, or pulmonary embolus.   Given his history of having prior lightheadedness with blood draws, an episode today being preceded by significant lightheadedness, suspect vasovagal syncope in setting of blood draw.  Suspect seizure-like activity witnessed was secondary to his vasovagal syncope as well.  He did not have a postictal period.  Does not have signs of significant tongue laceration on my exam.Patient discharged in stable condition with understanding of reasons to return.   Final Clinical Impression(s) / ED Diagnoses Final diagnoses:  Syncope, unspecified syncope type  Vasovagal episode    Rx / DC Orders ED Discharge Orders     None        Alvira Monday, MD 01/02/21 2149

## 2021-01-05 ENCOUNTER — Telehealth: Payer: Self-pay | Admitting: Family Medicine

## 2021-01-05 LAB — HEPATITIS C ANTIBODY
Hepatitis C Ab: NONREACTIVE
SIGNAL TO CUT-OFF: 0.02 (ref <1.00)

## 2021-01-05 NOTE — Telephone Encounter (Signed)
LVM for pt to CB regarding results.  

## 2021-01-05 NOTE — Telephone Encounter (Signed)
Please call patient kidney and thyroid function are normal Blood cell counts and electrolytes are normal Cholesterol panel is good and is at goal for him.  His liver enzymes are mildly elevated from last year. This levels are not concerning, since just mildly elevated- which can occur with certain meds (even OTC), alcohol use, fatty meals, or recent illness.   Please ask him how he was feeling over the weekend? I would like him to monitor for any fevers, wheezing, cough or other respiratory symptoms over the next week to ensure he does not develop pneumonia.  I do have concerns for him possibly aspirating during his blood draw, which can cause aspiration pneumonia. If he develops any the above, please have him call and so we can see him immediately and obtain chest x-ray and treat appropriately.

## 2021-01-06 NOTE — Telephone Encounter (Signed)
Spoke with pt regarding labs and instructions. Pt states he feeling fine with a some HA. Pt states he has been wheezing but uses his inhaler since it is cold as normal.

## 2021-01-16 ENCOUNTER — Ambulatory Visit (INDEPENDENT_AMBULATORY_CARE_PROVIDER_SITE_OTHER): Payer: 59 | Admitting: Podiatrist

## 2021-01-16 ENCOUNTER — Other Ambulatory Visit: Payer: Self-pay

## 2021-01-16 ENCOUNTER — Encounter: Payer: Self-pay | Admitting: Podiatrist

## 2021-01-16 DIAGNOSIS — M722 Plantar fascial fibromatosis: Secondary | ICD-10-CM

## 2021-01-16 NOTE — Progress Notes (Signed)
Patient presents today to pick up his orthotics-  the shoes he brought are too shallow so he is going to take the orthotics home and try them in lace up sneakers or work boots.  He may want a second pair to keep in his work boots-  he will try this pair out and make sure they feel good and will call the office for a second pair.

## 2021-02-09 ENCOUNTER — Ambulatory Visit (INDEPENDENT_AMBULATORY_CARE_PROVIDER_SITE_OTHER): Payer: 59 | Admitting: Podiatry

## 2021-02-09 ENCOUNTER — Other Ambulatory Visit: Payer: Self-pay

## 2021-02-09 ENCOUNTER — Encounter: Payer: Self-pay | Admitting: Podiatry

## 2021-02-09 DIAGNOSIS — M722 Plantar fascial fibromatosis: Secondary | ICD-10-CM | POA: Diagnosis not present

## 2021-02-09 NOTE — Progress Notes (Signed)
   Subjective:  Patient presents today status post endoscopic plantar fasciotomy bilateral. DOS: 08/28/2020.  Overall the patient states that he is doing very well.  He has minimal pain.  He does have some achiness if he is on his foot for a prolonged period of time.  Otherwise he is very satisfied with no new complaints at this time  Past Medical History:  Diagnosis Date   Allergy    Anal fissure    Asthma    Bilateral inguinal hernia (BIH) 05/21/2019   Chicken pox    HTN (hypertension)    Prediabetes 10/03/2017   Retinal hemorrhage 09/02/2017   Tick bite 12/14/2018    Objective: Physical Exam General: The patient is alert and oriented x3 in no acute distress.  Dermatology: Skin is cool, dry and supple bilateral lower extremities. Negative for open lesions or macerations.  Vascular: Palpable pedal pulses bilaterally. No edema or erythema noted. Capillary refill within normal limits.  Neurological: Epicritic and protective threshold grossly intact bilaterally.   Musculoskeletal Exam: No pedal deformities noted.  Status post EPF bilateral.  There is some pain on palpation throughout the medial longitudinal arch of the bilateral feet    Assessment: 1. s/p endoscopic plantar fasciotomy bilateral. DOS: 08/28/2020   Plan of Care:  1. Patient was evaluated.  2.  Overall the patient is doing very well.  He has minimal pain.  Significant improvement since prior to surgery. 3.  Patient may now resume full activity no restrictions 4.  Recommend good supportive shoes 5.  Return to clinic as needed negative for any significant pain on palpation throughout the medial longitudinal arch of the foot  *Lives in [place], [place].  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   Felecia Shelling, DPM [facility]  Dr. Felecia Shelling, DPM    [address] [place], [place] [address]                Office [phone]   Fax [phone]

## 2021-03-30 ENCOUNTER — Ambulatory Visit: Payer: 59

## 2021-03-30 ENCOUNTER — Ambulatory Visit: Payer: 59 | Admitting: Podiatry

## 2021-03-30 ENCOUNTER — Other Ambulatory Visit: Payer: Self-pay

## 2021-03-30 ENCOUNTER — Encounter: Payer: Self-pay | Admitting: Podiatry

## 2021-03-30 DIAGNOSIS — M722 Plantar fascial fibromatosis: Secondary | ICD-10-CM

## 2021-03-30 MED ORDER — IBUPROFEN 800 MG PO TABS: 800.0000 mg | ORAL_TABLET | Freq: Three times a day (TID) | ORAL | 1 refills | Status: DC

## 2021-03-31 NOTE — Progress Notes (Signed)
SITUATION Reason for Consult: Follow-up with foot orthotics Patient / Caregiver Report: Patient's heels still hurt  OBJECTIVE DATA History / Diagnosis:    ICD-10-CM   1. Plantar fasciitis, bilateral  M72.2       Change in Pathology: None  ACTIONS PERFORMED Patient's equipment was checked for structural stability and fit. Devices sent back for modification Device(s) intact and fit is excellent. All questions answered and concerns addressed.  PLAN Follow-up as needed (PRN). Plan of care discussed with and agreed upon by patient / caregiver.

## 2021-04-06 NOTE — Progress Notes (Signed)
° °  HPI: 41 y.o. male presenting today for follow-up evaluation after endoscopic plantar fasciotomy bilateral.  DOS: 08/28/2020.  Patient states that when he went back to work on his feet the week of Thanksgiving he noticed increased pain and tenderness specifically in the right heel.  He says that his left heel is doing very well.  His right heel became severely painful and he has been taking ibuprofen and icing his foot.  His PCP also prescribed gabapentin 300 mg nightly with no improvement.  He is very concerned due to the severe pain of the right heel.  He presents for further treatment and evaluation  Past Medical History:  Diagnosis Date   Allergy    Anal fissure    Asthma    Bilateral inguinal hernia (BIH) 05/21/2019   Chicken pox    HTN (hypertension)    Prediabetes 10/03/2017   Retinal hemorrhage 09/02/2017   Tick bite 12/14/2018    Past Surgical History:  Procedure Laterality Date   PLANTAR FASCIA RELEASE     SPHINCTEROTOMY  2020    Allergies  Allergen Reactions   Penicillins Rash    Rash only-as a child      Physical Exam: General: The patient is alert and oriented x3 in no acute distress.  Dermatology: Skin is warm, dry and supple bilateral lower extremities. Negative for open lesions or macerations.  Vascular: Palpable pedal pulses bilaterally. Capillary refill within normal limits.  Negative for any significant edema or erythema  Neurological: Light touch and protective threshold grossly intact  Musculoskeletal Exam: No pedal deformities noted.  Significant tenderness to palpation along the plantar fascia plantar heel right   Assessment: 1.  S/P endoscopic plantar fasciotomy bilateral 2.  Recurrent plantar fasciitis right   Plan of Care:  1. Patient evaluated. 2.  Due to concern for the patient and the recurrence of the severe pain to the right heel were going to order MRI right heel.  Order placed today 3.  Also today the patient was molded for custom molded  orthotics by our Pedorthist 4.  Prescription for ibuprofen 800 mg 3 times daily 5.  Cam boot dispensed.  Weightbearing as tolerated x3-4 weeks 6.  Return to clinic after MRI to review results and discuss different treatment options      Felecia Shelling, DPM [facility]  Dr. Felecia Shelling, DPM    [address] [place], [place] [address]                Office [phone]  Fax [phone]

## 2021-04-08 ENCOUNTER — Telehealth: Payer: Self-pay | Admitting: Podiatry

## 2021-04-08 NOTE — Telephone Encounter (Signed)
Corrected orthotics in.lvm for pt to call to schedule an appt to pick them up.. °

## 2021-04-21 ENCOUNTER — Ambulatory Visit
Admission: RE | Admit: 2021-04-21 | Discharge: 2021-04-21 | Disposition: A | Discharge status: Home / Self Care | Payer: 59 | Source: Ambulatory Visit | Attending: Podiatry | Admitting: Podiatry

## 2021-04-21 ENCOUNTER — Other Ambulatory Visit: Payer: Self-pay

## 2021-04-21 DIAGNOSIS — M722 Plantar fascial fibromatosis: Secondary | ICD-10-CM

## 2021-05-04 ENCOUNTER — Other Ambulatory Visit: Payer: Self-pay

## 2021-05-04 ENCOUNTER — Ambulatory Visit: Payer: 59 | Admitting: Podiatry

## 2021-05-04 DIAGNOSIS — M722 Plantar fascial fibromatosis: Secondary | ICD-10-CM

## 2021-05-04 MED ORDER — BETAMETHASONE SOD PHOS & ACET 6 (3-3) MG/ML IJ SUSP: 3.0000 mg | Freq: Once | INTRAMUSCULAR | Status: DC

## 2021-05-04 NOTE — Progress Notes (Signed)
° °  HPI: 41 y.o. male presenting today for follow-up evaluation after endoscopic plantar fasciotomy bilateral.  DOS: 08/28/2020.  Patient states that he continues to have right heel pain.  Last visit on 03/30/2021 MRI was ordered.  He presents today to review the MRI results and discuss further treatment options.  He has been wearing the cam boot which does help.  Past Medical History:  Diagnosis Date   Allergy    Anal fissure    Asthma    Bilateral inguinal hernia (BIH) 05/21/2019   Chicken pox    HTN (hypertension)    Prediabetes 10/03/2017   Retinal hemorrhage 09/02/2017   Tick bite 12/14/2018    Past Surgical History:  Procedure Laterality Date   PLANTAR FASCIA RELEASE     SPHINCTEROTOMY  2020    Allergies  Allergen Reactions   Penicillins Rash    Rash only-as a child      Physical Exam: General: The patient is alert and oriented x3 in no acute distress.  Dermatology: Skin is warm, dry and supple bilateral lower extremities. Negative for open lesions or macerations.  Vascular: Palpable pedal pulses bilaterally. Capillary refill within normal limits.  Negative for any significant edema or erythema  Neurological: Light touch and protective threshold grossly intact  Musculoskeletal Exam: No pedal deformities noted.  There continues to be significant tenderness to palpation along the plantar fascia plantar heel right  MR HEEL RT WO CONTRAST 04/21/2021: IMPRESSION:: IMPRESSION: 1. Normal appearance of the Achilles tendon.  No tendinosis or tear. 2. Mild posterior tibial tenosynovitis. 3. Moderate calcaneal heel spur. No definite active plantar fasciitis.  Assessment: 1.  S/P endoscopic plantar fasciotomy bilateral 2.  Recurrent plantar fasciitis right   Plan of Care:  1. Patient evaluated.  MRI reviewed today 2.  Injection of 0.5 cc Celestone Soluspan injected into the right plantar fascia 3.  Continue Motrin 800 mg 3 times daily as needed 4.  Custom molded orthotics  were dispensed today with break-in instructions provided.  Slowly break-in over the next 2 weeks 5.  Order placed for physical therapy at [facility]  6.  Return to clinic 6 weeks     Felecia Shelling, DPM [facility]  Dr. Felecia Shelling, DPM    [address] [place], [place] [address]                Office [phone]  Fax [phone]

## 2021-06-15 ENCOUNTER — Other Ambulatory Visit: Payer: Self-pay

## 2021-06-15 ENCOUNTER — Ambulatory Visit: Payer: 59 | Admitting: Podiatry

## 2021-06-15 DIAGNOSIS — M722 Plantar fascial fibromatosis: Secondary | ICD-10-CM

## 2021-06-15 NOTE — Progress Notes (Signed)
? ?  HPI: 41 y.o. male presenting today for follow-up evaluation of severe plantar fasciitis to the right heel.  Patient states that it is significantly painful.  He is very frustrated with the residual pain.  He says that the surgery to the left foot did well however the right foot has significant pain and tenderness throughout the day.  We have tried different treatment options including steroid injections, immobilization in a cam boot, anti-inflammatories, and MRI of the right foot.  He would like to discuss aggressive treatment options while he has good insurance through his work and for lasting relief of the right heel pain. ? ?Past Medical History:  ?Diagnosis Date  ? Allergy   ? Anal fissure   ? Asthma   ? Bilateral inguinal hernia (BIH) 05/21/2019  ? Chicken pox   ? HTN (hypertension)   ? Prediabetes 10/03/2017  ? Retinal hemorrhage 09/02/2017  ? Tick bite 12/14/2018  ? ? ?Past Surgical History:  ?Procedure Laterality Date  ? PLANTAR FASCIA RELEASE    ? SPHINCTEROTOMY  2020  ? ? ?Allergies  ?Allergen Reactions  ? Penicillins Rash  ?  Rash only-as a child ?  ? ?  ?Physical Exam: ?General: The patient is alert and oriented x3 in no acute distress. ? ?Dermatology: Skin is warm, dry and supple bilateral lower extremities. Negative for open lesions or macerations. ? ?Vascular: Palpable pedal pulses bilaterally. Capillary refill within normal limits.  Negative for any significant edema or erythema ? ?Neurological: Light touch and protective threshold grossly intact ? ?Musculoskeletal Exam: No pedal deformities noted.  There continues to be chronic severe tenderness to palpation along the plantar fascia plantar heel right ? ?MR HEEL RT WO CONTRAST 04/21/2021: ?IMPRESSION:: ?IMPRESSION: ?1. Normal appearance of the Achilles tendon.  No tendinosis or tear. ?2. Mild posterior tibial tenosynovitis. ?3. Moderate calcaneal heel spur. No definite active plantar ?fasciitis. ? ?Assessment: ?1.  S/P endoscopic plantar fasciotomy  bilateral 08/28/2020 ?2.  Recurrent severe plantar fasciitis right ? ? ?Plan of Care:  ?1. Patient evaluated.   ?2.  Continue custom molded orthotics ?3.  Today we discussed additional treatment options.  The patient is very frustrated with the severe pain he is feeling to the right heel.  He would rather be aggressive and go back for revisional open plantar fasciotomy with possible resection of the heel spur.  After evaluating the patient I do believe that this would be his best chance at alleviating the patient's symptoms long-term ?4. Today we discussed the conservative versus surgical management of the presenting pathology. The patient opts for surgical management. All possible complications and details of the procedure were explained. All patient questions were answered. No guarantees were expressed or implied. ?5. Authorization for surgery was initiated today. Surgery will consist of open plantar fasciotomy right.  Possible plantar heel spur resection right. ?6.  Return to clinic 1 week postop ? ?  ?Felecia Shelling, DPM ?[facility] ? ?Dr. Felecia Shelling, DPM  ?  ?[address]                                        ?[place], [place] [address]                ?Office [phone]  ?Fax [phone] ? ? ? ? ?

## 2021-06-22 ENCOUNTER — Telehealth: Payer: Self-pay | Admitting: Urology

## 2021-06-22 NOTE — Telephone Encounter (Signed)
DOS - 07/16/21 ? ?EPF RIGHT --- [phone] ?HEEL SPUR RESECTION RIGHT --- 28119 ? ?[employer] EFFECTIVE DATE - 03/22/21 ? ?PLAN DEDUCTIBLE - $1,200.00 W/ $0.00 REMAINING ?OUT OF POCKET - $3,200.00 W/ $1,939.47 REMAINING  ?COINSURANCE - 20% ?COPAY - $0.00 ? ? ?PER UHC [place] [address] AND [address] HAVE BEEN APPROVED, AUTH # [id number], GOOD FROM 07/16/21 - 10/14/21. ?

## 2021-07-02 ENCOUNTER — Other Ambulatory Visit: Payer: Self-pay

## 2021-07-02 MED ORDER — GABAPENTIN 100 MG PO CAPS: ORAL_CAPSULE | ORAL | 0 refills | Status: DC

## 2021-07-06 ENCOUNTER — Other Ambulatory Visit: Payer: Self-pay

## 2021-07-06 MED ORDER — IRBESARTAN 150 MG PO TABS: 150.0000 mg | ORAL_TABLET | Freq: Every day | ORAL | 0 refills | Status: DC

## 2021-07-16 ENCOUNTER — Other Ambulatory Visit: Payer: Self-pay | Admitting: Podiatry

## 2021-07-16 ENCOUNTER — Encounter: Payer: Self-pay | Admitting: Podiatry

## 2021-07-16 DIAGNOSIS — M722 Plantar fascial fibromatosis: Secondary | ICD-10-CM | POA: Diagnosis not present

## 2021-07-16 DIAGNOSIS — M7731 Calcaneal spur, right foot: Secondary | ICD-10-CM | POA: Diagnosis not present

## 2021-07-16 MED ORDER — OXYCODONE-ACETAMINOPHEN 5-325 MG PO TABS: 1.0000 | ORAL_TABLET | ORAL | 0 refills | Status: DC | PRN

## 2021-07-16 MED ORDER — IBUPROFEN 800 MG PO TABS: 800.0000 mg | ORAL_TABLET | Freq: Three times a day (TID) | ORAL | 1 refills | Status: DC

## 2021-07-16 NOTE — Progress Notes (Signed)
PRN postop 

## 2021-07-20 DIAGNOSIS — M79676 Pain in unspecified toe(s): Secondary | ICD-10-CM

## 2021-07-22 ENCOUNTER — Ambulatory Visit (INDEPENDENT_AMBULATORY_CARE_PROVIDER_SITE_OTHER): Payer: 59

## 2021-07-22 ENCOUNTER — Ambulatory Visit (INDEPENDENT_AMBULATORY_CARE_PROVIDER_SITE_OTHER): Payer: 59 | Admitting: Podiatry

## 2021-07-22 DIAGNOSIS — M722 Plantar fascial fibromatosis: Secondary | ICD-10-CM

## 2021-07-22 DIAGNOSIS — Z9889 Other specified postprocedural states: Secondary | ICD-10-CM

## 2021-07-22 NOTE — Progress Notes (Signed)
? ?  Subjective:  ?Patient presents today status post open plantar fasciotomy with plantar heel spur resection right. DOS: 07/16/2021.  Patient states that he is doing well.  He is kept the boot on and is minimally weightbearing in the boot as tolerated.  He presents today with his wife.  No new complaints at this time ? ?Past Medical History:  ?Diagnosis Date  ? Allergy   ? Anal fissure   ? Asthma   ? Bilateral inguinal hernia (BIH) 05/21/2019  ? Chicken pox   ? HTN (hypertension)   ? Prediabetes 10/03/2017  ? Retinal hemorrhage 09/02/2017  ? Tick bite 12/14/2018  ? ?  ?Past Surgical History:  ?Procedure Laterality Date  ? PLANTAR FASCIA RELEASE    ? SPHINCTEROTOMY  2020  ? ?Allergies  ?Allergen Reactions  ? Penicillins Rash  ?  Rash only-as a child ?  ? ? ? ?Objective/Physical Exam ?Neurovascular status intact.  Skin incisions appear to be well coapted with sutures and staples intact. No sign of infectious process noted. No dehiscence. No active bleeding noted. Moderate edema noted to the surgical extremity. ? ?Radiographic Exam:  ?No gas within the tissues.  Well-healing surgical foot ? ?Assessment: ?1. s/p open plantar fasciotomy with slight heel spur resection right. DOS: 07/16/2021 ? ? ?Plan of Care:  ?1. Patient was evaluated. X-rays reviewed ?2.  Continue minimal weightbearing in the cam boot ?3.  Dressings changed.  Supplies were provided to change dressings daily ?4.  Return to clinic 1 week ? ? ?Felecia Shelling, DPM ?[facility] ? ?Dr. Felecia Shelling, DPM  ?  ?[address]                                    ?[place], [place] [address]                ?Office [phone]  ?Fax [phone] ? ? ? ? ? ?

## 2021-07-29 ENCOUNTER — Ambulatory Visit (INDEPENDENT_AMBULATORY_CARE_PROVIDER_SITE_OTHER): Payer: 59 | Admitting: Podiatry

## 2021-07-29 ENCOUNTER — Encounter: Payer: 59 | Admitting: Podiatry

## 2021-07-29 DIAGNOSIS — Z9889 Other specified postprocedural states: Secondary | ICD-10-CM

## 2021-07-29 MED ORDER — OXYCODONE-ACETAMINOPHEN 5-325 MG PO TABS: 1.0000 | ORAL_TABLET | Freq: Three times a day (TID) | ORAL | 0 refills | Status: DC | PRN

## 2021-07-29 NOTE — Progress Notes (Signed)
? ?  Subjective:  ?Patient presents today status post open plantar fasciotomy with plantar heel spur resection right. DOS: 07/16/2021.  Patient doing well.  He is continued weightbearing in the cam boot as instructed.  No new complaints at this time ? ?Past Medical History:  ?Diagnosis Date  ? Allergy   ? Anal fissure   ? Asthma   ? Bilateral inguinal hernia (BIH) 05/21/2019  ? Chicken pox   ? HTN (hypertension)   ? Prediabetes 10/03/2017  ? Retinal hemorrhage 09/02/2017  ? Tick bite 12/14/2018  ? ?  ?Past Surgical History:  ?Procedure Laterality Date  ? PLANTAR FASCIA RELEASE    ? SPHINCTEROTOMY  2020  ? ?Allergies  ?Allergen Reactions  ? Penicillins Rash  ?  Rash only-as a child ?  ? ? ? ?Objective/Physical Exam ?Neurovascular status intact.  Skin incisions appear to be well coapted with sutures  intact. No sign of infectious process noted. No dehiscence. No active bleeding noted. Moderate edema noted to the surgical extremity. ? ? ?Assessment: ?1. s/p open plantar fasciotomy with slight heel spur resection right. DOS: 07/16/2021 ? ? ?Plan of Care:  ?1. Patient was evaluated.  Sutures left intact today ?2.  Patient may begin washing and showering and getting the foot wet ?3.  Compression ankle sleeves dispensed. ?4.  Continue cam boot for an additional 2-3 weeks ?5.  Refill prescription for Percocet 5/325 mg  ?6.  Return to clinic in 1 week for suture removal ? ?Felecia Shelling, DPM ?[facility] ? ?Dr. Felecia Shelling, DPM  ?  ?[address]                                    ?[place], [place] [address]                ?Office [phone]  ?Fax [phone] ? ? ? ? ? ?

## 2021-08-05 ENCOUNTER — Encounter: Payer: 59 | Admitting: Podiatry

## 2021-08-05 ENCOUNTER — Ambulatory Visit (INDEPENDENT_AMBULATORY_CARE_PROVIDER_SITE_OTHER): Payer: 59

## 2021-08-05 ENCOUNTER — Ambulatory Visit (INDEPENDENT_AMBULATORY_CARE_PROVIDER_SITE_OTHER): Payer: 59 | Admitting: Podiatry

## 2021-08-05 DIAGNOSIS — Z9889 Other specified postprocedural states: Secondary | ICD-10-CM

## 2021-08-05 NOTE — Progress Notes (Signed)
? ?  Subjective:  ?Patient presents today status post open plantar fasciotomy with plantar heel spur resection right. DOS: 07/16/2021.  Overall the patient is doing well.  He presents today with his spouse.  He says that the foot is tolerable.  He says that the pain is nothing out of the ordinary.  No new complaints at this time ? ?Past Medical History:  ?Diagnosis Date  ? Allergy   ? Anal fissure   ? Asthma   ? Bilateral inguinal hernia (BIH) 05/21/2019  ? Chicken pox   ? HTN (hypertension)   ? Prediabetes 10/03/2017  ? Retinal hemorrhage 09/02/2017  ? Tick bite 12/14/2018  ? ?  ?Past Surgical History:  ?Procedure Laterality Date  ? PLANTAR FASCIA RELEASE    ? SPHINCTEROTOMY  2020  ? ?Allergies  ?Allergen Reactions  ? Penicillins Rash  ?  Rash only-as a child ?  ? ? ? ?Objective/Physical Exam ?Neurovascular status intact.  Skin incisions appear to be well coapted with sutures  intact. No sign of infectious process noted. No dehiscence. No active bleeding noted.  There continues to be some moderate edema noted to the surgical extremity. ? ? ?Assessment: ?1. s/p open plantar fasciotomy with slight heel spur resection right. DOS: 07/16/2021 ? ? ?Plan of Care:  ?1. Patient was evaluated.   ?2.  Sutures removed ?3.  Compression ankle sleeve dispensed.  Wear daily ?4.  Continue weightbearing in the cam boot for 2 additional weeks ?5.  Return to clinic in 2 weeks for follow-up and to begin transitioning the patient out of the cam boot into good supportive shoes and sneakers ? ?Felecia Shelling, DPM ?[facility] ? ?Dr. Felecia Shelling, DPM  ?  ?[address]                                    ?[place], [place] [address]                ?Office [phone]  ?Fax [phone] ? ? ? ? ? ?

## 2021-08-06 ENCOUNTER — Other Ambulatory Visit: Payer: Self-pay

## 2021-08-11 DIAGNOSIS — M79676 Pain in unspecified toe(s): Secondary | ICD-10-CM

## 2021-08-12 ENCOUNTER — Encounter: Payer: 59 | Admitting: Podiatry

## 2021-08-19 ENCOUNTER — Encounter: Payer: 59 | Admitting: Podiatry

## 2021-08-24 ENCOUNTER — Ambulatory Visit (INDEPENDENT_AMBULATORY_CARE_PROVIDER_SITE_OTHER): Payer: 59 | Admitting: Podiatry

## 2021-08-24 ENCOUNTER — Other Ambulatory Visit: Payer: Self-pay

## 2021-08-24 DIAGNOSIS — Z9889 Other specified postprocedural states: Secondary | ICD-10-CM

## 2021-08-24 MED ORDER — FLUOXETINE HCL 20 MG PO CAPS: 40.0000 mg | ORAL_CAPSULE | Freq: Every day | ORAL | 0 refills | Status: DC

## 2021-08-24 NOTE — Progress Notes (Signed)
   Subjective:  Patient presents today status post open plantar fasciotomy with plantar heel spur resection right. DOS: 07/16/2021.  Patient continues to do well.  Today he presents wearing regular tennis shoes.  No new complaints at this time  Past Medical History:  Diagnosis Date   Allergy    Anal fissure    Asthma    Bilateral inguinal hernia (BIH) 05/21/2019   Chicken pox    HTN (hypertension)    Prediabetes 10/03/2017   Retinal hemorrhage 09/02/2017   Tick bite 12/14/2018     Past Surgical History:  Procedure Laterality Date   PLANTAR FASCIA RELEASE     SPHINCTEROTOMY  2020   Allergies  Allergen Reactions   Penicillins Rash    Rash only-as a child      Objective/Physical Exam Neurovascular status intact.  Skin incisions healed.  No sign of infectious process noted. No dehiscence. No active bleeding noted.  The edema to the foot appears to be mostly resolved.  There is some very slight edema localized to the heel   Assessment: 1. s/p open plantar fasciotomy with slight heel spur resection right. DOS: 07/16/2021   Plan of Care:  1. Patient was evaluated.   2.  Overall the patient is doing well and improving.  He may begin to slowly increase activity and good supportive shoes and sneakers.  Slow transition out of the cam boot 3.  Continue wearing compression ankle sleeve daily 4.  Return to clinic in 6 weeks for reevaluation  Edrick Kins, DPM [facility]  Dr. Edrick Kins, DPM    [address], [place] [address]                Office [phone]  Fax [phone]

## 2021-10-05 ENCOUNTER — Encounter: Payer: Self-pay | Admitting: Podiatry

## 2021-10-06 ENCOUNTER — Other Ambulatory Visit: Payer: Self-pay

## 2021-10-06 MED ORDER — OMEPRAZOLE 20 MG PO CPDR: 20.0000 mg | DELAYED_RELEASE_CAPSULE | Freq: Every day | ORAL | 0 refills | Status: DC

## 2021-10-12 ENCOUNTER — Ambulatory Visit (INDEPENDENT_AMBULATORY_CARE_PROVIDER_SITE_OTHER): Payer: Self-pay | Admitting: Podiatry

## 2021-10-12 DIAGNOSIS — Z9889 Other specified postprocedural states: Secondary | ICD-10-CM

## 2021-10-12 NOTE — Progress Notes (Signed)
   Subjective:  Patient presents today status post open plantar fasciotomy with plantar heel spur resection right. DOS: 07/16/2021.  Patient states that he is doing very well.  He did stepped in a hole this past Saturday outdoors and noticed some slight increased tenderness to the area.  He also states that when he walks his heel rolls and is causing some lateral knee pain to the right knee.  Past Medical History:  Diagnosis Date   Allergy    Anal fissure    Asthma    Bilateral inguinal hernia (BIH) 05/21/2019   Chicken pox    HTN (hypertension)    Prediabetes 10/03/2017   Retinal hemorrhage 09/02/2017   Tick bite 12/14/2018     Past Surgical History:  Procedure Laterality Date   PLANTAR FASCIA RELEASE     SPHINCTEROTOMY  2020   Allergies  Allergen Reactions   Penicillins Rash    Rash only-as a child      Objective/Physical Exam Neurovascular status intact.  No edema.  Minimal tenderness with palpation throughout the heel.  Patient states that there is some slight paresthesia with light touch around the heel   Assessment: 1. s/p open plantar fasciotomy with slight heel spur resection right. DOS: 07/16/2021 2.  Right lateral knee pain  Plan of Care:  1. Patient was evaluated.   2.  Patient states that he feels that his right foot rolled outward when walking in his orthotics.  He is going to bring his orthotics into the office for modification to apply a lateral heel post to prevent rolling of the heel 3.  Continue wearing compression ankle sleeve daily PRN  4.  Continue OTC ibuprofen as needed.  Patient states that he only takes it occasionally 5.  Return to clinic in 6 weeks for reevaluation  Felecia Shelling, DPM [facility]  Dr. Felecia Shelling, DPM    [address] [place], [place] [address]                Office [phone]  Fax [phone]

## 2021-10-20 ENCOUNTER — Ambulatory Visit (INDEPENDENT_AMBULATORY_CARE_PROVIDER_SITE_OTHER): Payer: Self-pay | Admitting: Family Medicine

## 2021-10-20 ENCOUNTER — Encounter: Payer: Self-pay | Admitting: Family Medicine

## 2021-10-20 VITALS — BP 143/80 | HR 61 | Temp 98.0°F | Ht 75.0 in | Wt >= 6400 oz

## 2021-10-20 DIAGNOSIS — J4520 Mild intermittent asthma, uncomplicated: Secondary | ICD-10-CM

## 2021-10-20 DIAGNOSIS — I10 Essential (primary) hypertension: Secondary | ICD-10-CM

## 2021-10-20 DIAGNOSIS — K219 Gastro-esophageal reflux disease without esophagitis: Secondary | ICD-10-CM

## 2021-10-20 DIAGNOSIS — R030 Elevated blood-pressure reading, without diagnosis of hypertension: Secondary | ICD-10-CM

## 2021-10-20 DIAGNOSIS — Z111 Encounter for screening for respiratory tuberculosis: Secondary | ICD-10-CM

## 2021-10-20 DIAGNOSIS — E66813 Obesity, class 3: Secondary | ICD-10-CM

## 2021-10-20 MED ORDER — IRBESARTAN 150 MG PO TABS: 150.0000 mg | ORAL_TABLET | Freq: Every day | ORAL | 5 refills | Status: DC

## 2021-10-20 MED ORDER — OMEPRAZOLE 20 MG PO CPDR: 20.0000 mg | DELAYED_RELEASE_CAPSULE | Freq: Every day | ORAL | 5 refills | Status: DC

## 2021-10-20 MED ORDER — ALBUTEROL SULFATE HFA 108 (90 BASE) MCG/ACT IN AERS: INHALATION_SPRAY | RESPIRATORY_TRACT | 5 refills | Status: DC

## 2021-10-20 MED ORDER — FLUTICASONE PROPIONATE 50 MCG/ACT NA SUSP: NASAL | 5 refills | Status: DC
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
## 2021-10-20 NOTE — Addendum Note (Signed)
Addended by: Maxie Barb on: 10/20/2021 04:37 PM   Modules accepted: Orders

## 2021-10-20 NOTE — Patient Instructions (Signed)
No follow-ups on file.        Great to see you today.  I have refilled the medication(s) we provide.   If labs were collected, we will inform you of lab results once received either by echart message or telephone call.   - echart message- for normal results that have been seen by the patient already.   - telephone call: abnormal results or if patient has not viewed results in their echart.  

## 2021-10-20 NOTE — Progress Notes (Signed)
TB skin test given on R arm. No complication wheal present. Pt inform not to scratch not to scratch area.

## 2021-10-23 ENCOUNTER — Ambulatory Visit (INDEPENDENT_AMBULATORY_CARE_PROVIDER_SITE_OTHER): Payer: Self-pay

## 2021-10-23 LAB — TB SKIN TEST
Induration: 0 mm
TB Skin Test: NEGATIVE

## 2022-01-27 ENCOUNTER — Telehealth: Payer: BC Managed Care – PPO | Admitting: Physician Assistant

## 2022-01-27 DIAGNOSIS — J019 Acute sinusitis, unspecified: Secondary | ICD-10-CM

## 2022-01-27 DIAGNOSIS — B9689 Other specified bacterial agents as the cause of diseases classified elsewhere: Secondary | ICD-10-CM

## 2022-01-27 MED ORDER — DOXYCYCLINE HYCLATE 100 MG PO TABS: 100.0000 mg | ORAL_TABLET | Freq: Two times a day (BID) | ORAL | 0 refills | Status: DC

## 2022-01-27 NOTE — Progress Notes (Signed)
E-Visit for Sinus Problems  We are sorry that you are not feeling well.  Here is how we plan to help!  Based on what you have shared with me it looks like you have sinusitis.  Sinusitis is inflammation and infection in the sinus cavities of the head.  Based on your presentation I believe you most likely have Acute Bacterial Sinusitis.  This is an infection caused by bacteria and is treated with antibiotics. I have prescribed Doxycycline 100mg by mouth twice a day for 10 days. You may use an oral decongestant such as Mucinex D or if you have glaucoma or high blood pressure use plain Mucinex. Saline nasal spray help and can safely be used as often as needed for congestion.  If you develop worsening sinus pain, fever or notice severe headache and vision changes, or if symptoms are not better after completion of antibiotic, please schedule an appointment with a health care provider.    Sinus infections are not as easily transmitted as other respiratory infection, however we still recommend that you avoid close contact with loved ones, especially the very young and elderly.  Remember to wash your hands thoroughly throughout the day as this is the number one way to prevent the spread of infection!  Home Care: Only take medications as instructed by your medical team. Complete the entire course of an antibiotic. Do not take these medications with alcohol. A steam or ultrasonic humidifier can help congestion.  You can place a towel over your head and breathe in the steam from hot water coming from a faucet. Avoid close contacts especially the very young and the elderly. Cover your mouth when you cough or sneeze. Always remember to wash your hands.  Get Help Right Away If: You develop worsening fever or sinus pain. You develop a severe head ache or visual changes. Your symptoms persist after you have completed your treatment plan.  Make sure you Understand these instructions. Will watch your  condition. Will get help right away if you are not doing well or get worse.  Thank you for choosing an e-visit.  Your e-visit answers were reviewed by a board certified advanced clinical practitioner to complete your personal care plan. Depending upon the condition, your plan could have included both over the counter or prescription medications.  Please review your pharmacy choice. Make sure the pharmacy is open so you can pick up prescription now. If there is a problem, you may contact your provider through MyChart messaging and have the prescription routed to another pharmacy.  Your safety is important to us. If you have drug allergies check your prescription carefully.   For the next 24 hours you can use MyChart to ask questions about today's visit, request a non-urgent call back, or ask for a work or school excuse. You will get an email in the next two days asking about your experience. I hope that your e-visit has been valuable and will speed your recovery.  I have spent 5 minutes in review of e-visit questionnaire, review and updating patient chart, medical decision making and response to patient.   Sharan Mcenaney M Shantal Roan, PA-C  

## 2022-03-31 ENCOUNTER — Encounter: Payer: Self-pay | Admitting: Family Medicine

## 2022-04-12 ENCOUNTER — Encounter: Payer: Self-pay | Admitting: Family Medicine

## 2022-04-12 ENCOUNTER — Ambulatory Visit (INDEPENDENT_AMBULATORY_CARE_PROVIDER_SITE_OTHER): Payer: BC Managed Care – PPO | Admitting: Family Medicine

## 2022-04-12 VITALS — BP 130/80 | HR 71 | Temp 98.1°F | Ht 74.41 in | Wt 389.4 lb

## 2022-04-12 DIAGNOSIS — K219 Gastro-esophageal reflux disease without esophagitis: Secondary | ICD-10-CM | POA: Diagnosis not present

## 2022-04-12 DIAGNOSIS — I10 Essential (primary) hypertension: Secondary | ICD-10-CM | POA: Diagnosis not present

## 2022-04-12 DIAGNOSIS — J4520 Mild intermittent asthma, uncomplicated: Secondary | ICD-10-CM

## 2022-04-12 DIAGNOSIS — Z23 Encounter for immunization: Secondary | ICD-10-CM

## 2022-04-12 DIAGNOSIS — Z0000 Encounter for general adult medical examination without abnormal findings: Secondary | ICD-10-CM

## 2022-04-12 DIAGNOSIS — J301 Allergic rhinitis due to pollen: Secondary | ICD-10-CM | POA: Diagnosis not present

## 2022-04-12 LAB — LIPID PANEL
Cholesterol: 165 mg/dL (ref 0–200)
HDL: 30.4 mg/dL — ABNORMAL LOW (ref >39.00)
LDL Cholesterol: 123 mg/dL — ABNORMAL HIGH (ref 0–99)
NonHDL: 135.07
Total CHOL/HDL Ratio: 5
Triglycerides: 60 mg/dL (ref 0.0–149.0)
VLDL: 12 mg/dL (ref 0.0–40.0)

## 2022-04-12 LAB — CBC WITH DIFFERENTIAL/PLATELET
Basophils Absolute: 0.1 10*3/uL (ref 0.0–0.1)
Basophils Relative: 0.6 % (ref 0.0–3.0)
Eosinophils Absolute: 0.2 10*3/uL (ref 0.0–0.7)
Eosinophils Relative: 2.3 % (ref 0.0–5.0)
HCT: 45.2 % (ref 39.0–52.0)
Hemoglobin: 15.3 g/dL (ref 13.0–17.0)
Lymphocytes Relative: 31.7 % (ref 12.0–46.0)
Lymphs Abs: 2.6 10*3/uL (ref 0.7–4.0)
MCHC: 33.9 g/dL (ref 30.0–36.0)
MCV: 82.1 fl (ref 78.0–100.0)
Monocytes Absolute: 0.4 10*3/uL (ref 0.1–1.0)
Monocytes Relative: 5.2 % (ref 3.0–12.0)
Neutro Abs: 5 10*3/uL (ref 1.4–7.7)
Neutrophils Relative %: 60.2 % (ref 43.0–77.0)
Platelets: 322 10*3/uL (ref 150.0–400.0)
RBC: 5.51 Mil/uL (ref 4.22–5.81)
RDW: 14.4 % (ref 11.5–15.5)
WBC: 8.3 10*3/uL (ref 4.0–10.5)

## 2022-04-12 LAB — COMPREHENSIVE METABOLIC PANEL
ALT: 71 U/L — ABNORMAL HIGH (ref 0–53)
AST: 36 U/L (ref 0–37)
Albumin: 4.3 g/dL (ref 3.5–5.2)
Alkaline Phosphatase: 49 U/L (ref 39–117)
BUN: 16 mg/dL (ref 6–23)
CO2: 28 mEq/L (ref 19–32)
Calcium: 9.2 mg/dL (ref 8.4–10.5)
Chloride: 106 mEq/L (ref 96–112)
Creatinine, Ser: 0.99 mg/dL (ref 0.40–1.50)
GFR: 95.06 mL/min (ref >60.00)
Glucose, Bld: 84 mg/dL (ref 70–99)
Potassium: 4.1 mEq/L (ref 3.5–5.1)
Sodium: 142 mEq/L (ref 135–145)
Total Bilirubin: 0.5 mg/dL (ref 0.2–1.2)
Total Protein: 6.8 g/dL (ref 6.0–8.3)

## 2022-04-12 LAB — TSH: TSH: 0.83 u[IU]/mL (ref 0.35–5.50)

## 2022-04-12 LAB — HEMOGLOBIN A1C: Hgb A1c MFr Bld: 6 % (ref 4.6–6.5)

## 2022-04-12 MED ORDER — IRBESARTAN 150 MG PO TABS: 150.0000 mg | ORAL_TABLET | Freq: Every day | ORAL | 5 refills | Status: DC

## 2022-04-12 MED ORDER — FLUTICASONE PROPIONATE 50 MCG/ACT NA SUSP: NASAL | 5 refills | Status: DC

## 2022-04-12 MED ORDER — OMEPRAZOLE 20 MG PO CPDR: 20.0000 mg | DELAYED_RELEASE_CAPSULE | Freq: Every day | ORAL | 5 refills | Status: DC

## 2022-04-12 MED ORDER — ALBUTEROL SULFATE HFA 108 (90 BASE) MCG/ACT IN AERS: INHALATION_SPRAY | RESPIRATORY_TRACT | 5 refills | Status: DC

## 2022-04-12 NOTE — Patient Instructions (Addendum)
Return in about 24 weeks (around 09/27/2022).        Great to see you today.  I have refilled the medication(s) we provide.   If labs were collected, we will inform you of lab results once received either by echart message or telephone call.   - echart message- for normal results that have been seen by the patient already.   - telephone call: abnormal results or if patient has not viewed results in their echart.

## 2022-04-12 NOTE — Progress Notes (Signed)
Patient ID: Nicholas Anthony, male  DOB: 1981/02/19, 42 y.o.   MRN: [id number] Patient Care Team    Relationship Specialty Notifications Start End  Natalia Leatherwood, DO PCP - General Family Medicine  09/12/17   Clark-Burning, Gertie Baron (Inactive)  Dermatology  10/17/19     Chief Complaint  Patient presents with   Annual Exam    Pt is fasting    Subjective:  Nicholas Anthony is a 42 y.o. male present for CPE and Chronic Conditions/illness Management All past medical history, surgical history, allergies, family history, immunizations, medications and social history were updated in the electronic medical record today. All recent labs, ED visits and hospitalizations within the last year were reviewed.  Health maintenance:  Colonoscopy: No fhx. Routine screen 46 Immunizations: tdap UTD 2016, Influenza provided today (encouraged yearly).   Infectious disease screening: HIV declined.  Hep c completed PSA: no fhx. Routine screen.No results found for: "PSA", pt was counseled on prostate cancer screenings.  Patient has a Dental home. Hospitalizations/ED visits: Reviewed  Hypertension/morbid obesity/ophthalmological abnormality:  Patient reports compliance  irbesartan 150 mg qd.  Patient denies chest pain, shortness of breath, dizziness or lower extremity edema.  Pt does not take a  daily baby ASA. Pt is is not  prescribed statin. Diet: low sodium Exercise: encouraged routinely.  RF: HTN, obesity, Fhx heart disease       10/20/2021    1:08 PM 01/02/2021    8:01 AM 07/25/2020    1:40 PM 02/06/2020    3:36 PM 07/24/2019    3:03 PM  Depression screen PHQ 2/9  Decreased Interest 0 0 0 0 0  Down, Depressed, Hopeless 0 0 0 0 0  PHQ - 2 Score 0 0 0 0 0  Altered sleeping   0    Tired, decreased energy   3    Change in appetite   3    Feeling bad or failure about yourself    0    Trouble concentrating   0    Moving slowly or fidgety/restless   0    Suicidal thoughts   0    PHQ-9 Score    6        07/25/2020    1:40 PM 07/24/2019    3:02 PM 12/14/2018    8:12 AM 05/26/2018   12:49 PM  GAD 7 : Generalized Anxiety Score  Nervous, Anxious, on Edge 0 1 0 0  Control/stop worrying 0 0 0 0  Worry too much - different things 0 1 0 0  Trouble relaxing 0 3 0 0  Restless 0 3 0 0  Easily annoyed or irritable 0 0 0 0  Afraid - awful might happen 0 0 0 0  Total GAD 7 Score 0 8 0 0  Anxiety Difficulty  Somewhat difficult Not difficult at all Not difficult at all           09/12/2017    9:37 AM  Fall Risk   Falls in the past year? No    Immunization History  Administered Date(s) Administered   DTaP 09/01/1981, 11/03/1981, 12/29/1981, 07/19/1984, 07/17/1986   IPV 09/01/1981, 11/03/1981, 03/02/1982, 07/17/1986   Influenza Split 03/30/2014   Influenza,inj,Quad PF,6+ Mos 11/25/2017, 12/14/2018, 01/02/2021, 04/12/2022   MMR 10/12/1982   PPD Test 10/20/2021   Tdap 03/30/2014    Past Medical History:  Diagnosis Date   Allergy    Anal fissure    Asthma    Attention deficit 11/25/2017  Bilateral inguinal hernia (BIH) 05/21/2019   Chicken pox    GERD (gastroesophageal reflux disease)    HTN (hypertension)    Mild anxiety    Prediabetes 10/03/2017   Retinal hemorrhage 09/02/2017   Tick bite 12/14/2018   Allergies  Allergen Reactions   Penicillins Rash    Rash only-as a child    Past Surgical History:  Procedure Laterality Date   HERNIA REPAIR     PLANTAR FASCIA RELEASE     SPHINCTEROTOMY  2020   Family History  Problem Relation Age of Onset   Diabetes Father    Heart disease Father    Hyperlipidemia Father    Hypertension Father    Drug abuse Sister    Alcohol abuse Sister    Arthritis Paternal Grandmother    Depression Paternal Grandmother    Diabetes Paternal Grandmother    Hyperlipidemia Paternal Grandmother    Heart disease Paternal Grandmother    Hypertension Paternal Grandmother    Arthritis Paternal Grandfather    Hearing loss Paternal  Grandfather    Heart disease Paternal Grandfather    Hyperlipidemia Paternal Grandfather    Hypertension Paternal Grandfather    Kidney disease Paternal Grandfather    Drug abuse Brother    Social History   Social History Narrative   Married, employed,    In Secretary/administrator. Currently working as a Engineer, building services.    Drinks caffeine.    Smoke alarm in the home. Wears his seatbelt.    Feels safe in his relationship.     Allergies as of 04/12/2022       Reactions   Penicillins Rash   Rash only-as a child        Medication List        Accurate as of April 12, 2022  1:25 PM. If you have any questions, ask your nurse or doctor.          STOP taking these medications    doxycycline 100 MG tablet Commonly known as: VIBRA-TABS Stopped by: Howard Pouch, DO       TAKE these medications    albuterol 108 (90 Base) MCG/ACT inhaler Commonly known as: VENTOLIN HFA INHALE 2 PUFFS BY MOUTH EVERY 4 HOURS AS NEEDED FOR WHEEZING OR SHORTNESS OF BREATH   fluticasone 50 MCG/ACT nasal spray Commonly known as: FLONASE USE 2 SPRAY IN EACH NOSTRIL DAILY   irbesartan 150 MG tablet Commonly known as: Avapro Take 1 tablet (150 mg total) by mouth daily.   omeprazole 20 MG capsule Commonly known as: PRILOSEC Take 1 capsule (20 mg total) by mouth daily.       All past medical history, surgical history, allergies, family history, immunizations andmedications were updated in the EMR today and reviewed under the history and medication portions of their EMR.     No results found for this or any previous visit (from the past 2160 hour(s)).   No results found.  ROS: 14 pt review of systems performed and negative (unless mentioned in an HPI)  Objective: BP 130/80   Pulse 71   Temp 98.1 F (36.7 C)   Ht 6' 2.41" (1.89 m)   Wt (!) 389 lb 6.4 oz (176.6 kg)   SpO2 95%   BMI 49.45 kg/m  Physical Exam Constitutional:      General: He is not in acute distress.    Appearance: Normal  appearance. He is obese. He is not ill-appearing, toxic-appearing or diaphoretic.  HENT:     Head: Normocephalic and atraumatic.  Right Ear: Tympanic membrane, ear canal and external ear normal. There is no impacted cerumen.     Left Ear: Tympanic membrane, ear canal and external ear normal. There is no impacted cerumen.     Nose: Nose normal. No congestion or rhinorrhea.     Mouth/Throat:     Mouth: Mucous membranes are moist.     Pharynx: Oropharynx is clear. No oropharyngeal exudate or posterior oropharyngeal erythema.  Eyes:     General: No scleral icterus.       Right eye: No discharge.        Left eye: No discharge.     Extraocular Movements: Extraocular movements intact.     Pupils: Pupils are equal, round, and reactive to light.  Cardiovascular:     Rate and Rhythm: Normal rate and regular rhythm.     Pulses: Normal pulses.     Heart sounds: Normal heart sounds. No murmur heard.    No friction rub. No gallop.  Pulmonary:     Effort: Pulmonary effort is normal. No respiratory distress.     Breath sounds: Normal breath sounds. No stridor. No wheezing, rhonchi or rales.  Chest:     Chest wall: No tenderness.  Abdominal:     General: Abdomen is flat. Bowel sounds are normal. There is no distension.     Palpations: Abdomen is soft. There is no mass.     Tenderness: There is no abdominal tenderness. There is no right CVA tenderness, left CVA tenderness, guarding or rebound.     Hernia: No hernia is present.  Musculoskeletal:        General: No swelling or tenderness. Normal range of motion.     Cervical back: Normal range of motion and neck supple.     Right lower leg: No edema.     Left lower leg: No edema.  Lymphadenopathy:     Cervical: No cervical adenopathy.  Skin:    General: Skin is warm and dry.     Coloration: Skin is not jaundiced.     Findings: No bruising, lesion or rash.  Neurological:     General: No focal deficit present.     Mental Status: He is alert  and oriented to person, place, and time. Mental status is at baseline.     Cranial Nerves: No cranial nerve deficit.     Sensory: No sensory deficit.     Motor: No weakness.     Coordination: Coordination normal.     Gait: Gait normal.     Deep Tendon Reflexes: Reflexes normal.  Psychiatric:        Mood and Affect: Mood normal.        Behavior: Behavior normal.        Thought Content: Thought content normal.        Judgment: Judgment normal.    No results found.  Assessment/plan: Toddrick Sanna is a 42 y.o. male present for CPE and Chronic Conditions/illness Management Hypertension/obesity:  Stable Continue Avapro to 150 mg qd - Continue diet and weight loss regimen, routine exercise.  -Low sodium diet. - CBC with Differential/Platelet - Comprehensive metabolic panel - Hemoglobin A1c - Lipid panel - TSH Gastroesophageal reflux disease without esophagitis Stable.  Continue protonix Influenza vaccine needed - Flu Vaccine QUAD 50mo+IM (Fluarix, Fluzone & Alfiuria Quad PF) Routine general medical examination at a health care facility - CBC with Differential/Platelet - Comprehensive metabolic panel - Hemoglobin A1c - Lipid panel - TSH Colonoscopy: No fhx. Routine screen 46 Immunizations:  tdap UTD 2016, Influenza provided today (encouraged yearly).   Infectious disease screening: HIV declined.  Hep c completed PSA: no fhx. Routine screen.No results found for: "PSA", pt was counseled on prostate cancer screenings.  Patient has a Dental home. Hospitalizations/ED visits: Reviewed Patient was encouraged to exercise greater than 150 minutes a week. Patient was encouraged to choose a diet filled with fresh fruits and vegetables, and lean meats. AVS provided to patient today for education/recommendation on gender specific health and safety maintenance.  Return in about 24 weeks (around 09/27/2022).  Orders Placed This Encounter  Procedures   Flu Vaccine QUAD 18mo+IM (Fluarix,  Fluzone & Alfiuria Quad PF)   CBC with Differential/Platelet   Comprehensive metabolic panel   Hemoglobin A1c   Lipid panel   TSH   Meds ordered this encounter  Medications   albuterol (VENTOLIN HFA) 108 (90 Base) MCG/ACT inhaler    Sig: INHALE 2 PUFFS BY MOUTH EVERY 4 HOURS AS NEEDED FOR WHEEZING OR SHORTNESS OF BREATH    Dispense:  8.5 g    Refill:  5   fluticasone (FLONASE) 50 MCG/ACT nasal spray    Sig: USE 2 SPRAY IN EACH NOSTRIL DAILY    Dispense:  16 g    Refill:  5   irbesartan (AVAPRO) 150 MG tablet    Sig: Take 1 tablet (150 mg total) by mouth daily.    Dispense:  30 tablet    Refill:  5   omeprazole (PRILOSEC) 20 MG capsule    Sig: Take 1 capsule (20 mg total) by mouth daily.    Dispense:  30 capsule    Refill:  5   Referral Orders  No referral(s) requested today     Note is dictated utilizing voice recognition software. Although note has been proof read prior to signing, occasional typographical errors still can be missed. If any questions arise, please do not hesitate to call for verification.  Electronically signed by: Felix Pacini, DO [facility]

## 2022-04-14 ENCOUNTER — Ambulatory Visit (INDEPENDENT_AMBULATORY_CARE_PROVIDER_SITE_OTHER): Payer: BC Managed Care – PPO | Admitting: Podiatry

## 2022-04-14 ENCOUNTER — Ambulatory Visit (INDEPENDENT_AMBULATORY_CARE_PROVIDER_SITE_OTHER): Payer: BC Managed Care – PPO

## 2022-04-14 VITALS — BP 145/92

## 2022-04-14 DIAGNOSIS — M722 Plantar fascial fibromatosis: Secondary | ICD-10-CM

## 2022-04-14 DIAGNOSIS — Z9889 Other specified postprocedural states: Secondary | ICD-10-CM | POA: Diagnosis not present

## 2022-04-14 NOTE — Progress Notes (Signed)
Chief Complaint  Patient presents with   Plantar Fasciitis    Patient came in today for follow-up for a  plantar fasciotomy with plantar heel spur resection right. DOS: 07/16/2021, patient is having swelling and pain in the right heel, X-Rays taken today     HPI: 42 y.o. male presenting today for recalcitrant pain and tenderness associated to the plantar aspect of the right heel.  Patient has a history of endoscopic plantar fasciotomy with revisional open plantar fasciotomy with resection of the heel spur.  DOS 07/16/2021.  Patient states that now he is experiencing pain and tenderness throughout the plantar heel and arch of the foot.  He also notices swelling.  He presents for further treatment and evaluation  Past Medical History:  Diagnosis Date   Allergy    Anal fissure    Asthma    Attention deficit 11/25/2017   Bilateral inguinal hernia (BIH) 05/21/2019   Chicken pox    GERD (gastroesophageal reflux disease)    HTN (hypertension)    Mild anxiety    Prediabetes 10/03/2017   Retinal hemorrhage 09/02/2017   Tick bite 12/14/2018    Past Surgical History:  Procedure Laterality Date   HERNIA REPAIR     PLANTAR FASCIA RELEASE     SPHINCTEROTOMY  2020    Allergies  Allergen Reactions   Penicillins Rash    Rash only-as a child      Physical Exam: General: The patient is alert and oriented x3 in no acute distress.  Dermatology: Skin is warm, dry and supple bilateral lower extremities. Negative for open lesions or macerations.  All skin incisions of healed nicely  Vascular: Palpable pedal pulses bilaterally. Capillary refill within normal limits.  There is some mild edema noted throughout the plantar heel and arch of the foot. Neurological: Light touch and protective threshold grossly intact  Musculoskeletal Exam: No pedal deformities noted  Radiographic Exam RT foot 04/14/2022:  Normal osseous mineralization. Joint spaces preserved. No fracture/dislocation/boney  destruction.  Plantar heel spur noted  Assessment: 1.  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 Patient evaluated. X-Rays reviewed.  2.  Patient continues to have pain and tenderness despite multiple conservative modalities that have been ongoing for years as well as revisional surgery to the plantar heel.  Today we discussed additional modalities that have not been pursued including PRP, stem cell, shockwave/EPAT.  For now recommend amniotic stem cell injection to help reduce inflammation and scar tissue formation as well as promote healing to the area.  This was discussed in detail with the patient.  The patient is amenable to this plan 3.  I will plan to reach out to the patient once we can order the antibiotic injectable stem cell and have it ready for him here in the office. 4.  Return to clinic for stem cell injection once this is arranged 5.  In the meantime Cam boot dispensed.  WBAT      Edrick Kins, DPM [facility]  Dr. Edrick Kins, DPM    [address], [place] [address]                Office [phone]  Fax [phone]

## 2022-05-23 IMAGING — CT CT HEAD W/O CM
3 of 4 series · 17 of 40 positions shown, 19 images · non-contrast
Comparison: No priors.

CLINICAL DATA: 40-year-old male with history of seizure. Abnormal
neurologic examination.

EXAM:
CT HEAD WITHOUT CONTRAST
TECHNIQUE: Contiguous axial images were obtained from the base of the skull
through the vertex without intravenous contrast.

[Series 2: head wo · axial · 0.46mm/px · z∈[-366,-246]mm · 7 of 32 slices shown, 9 images]
[im 4/32  brain]
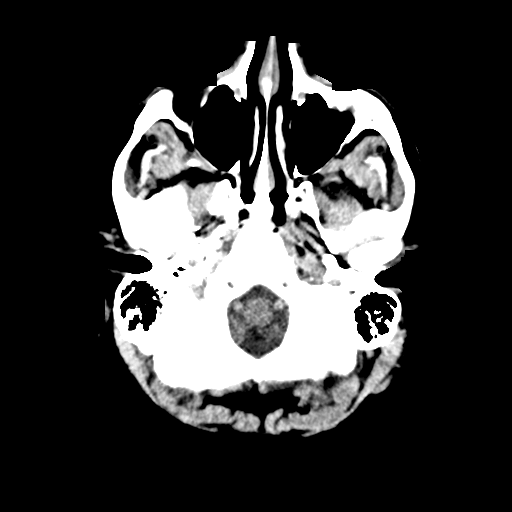
[im 4/32  bone]
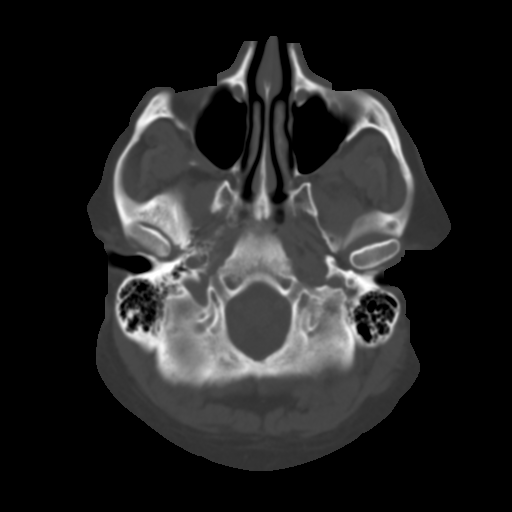
[im 8/32  brain]
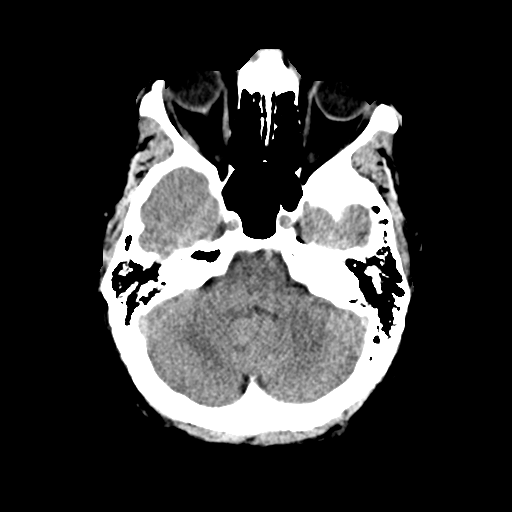
[im 12/32  brain]
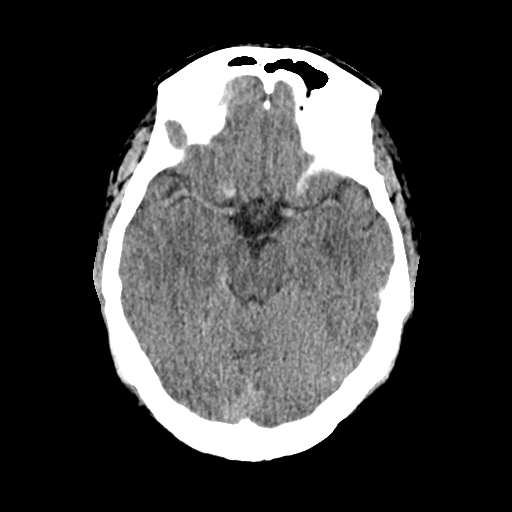
[im 16/32  brain]
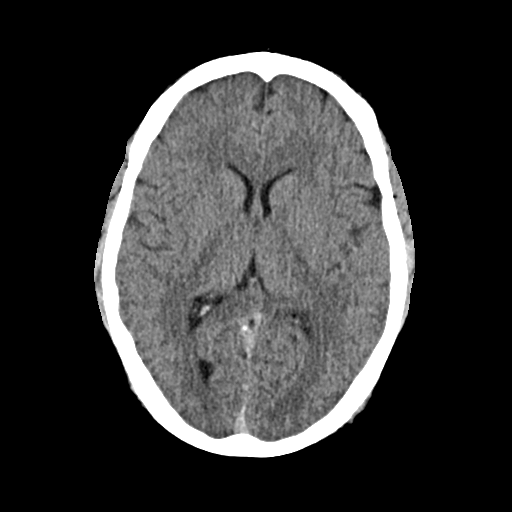
[im 20/32  brain]
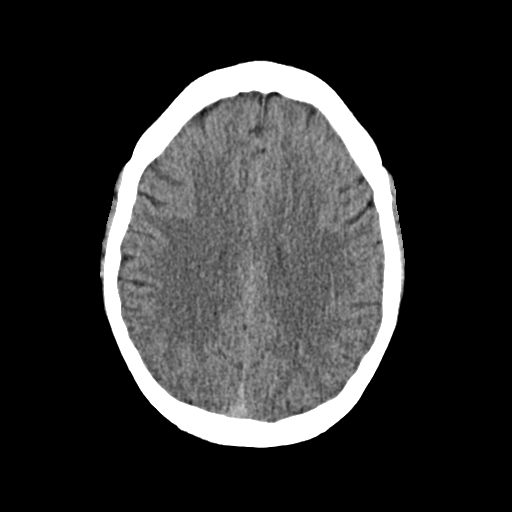
[im 20/32  bone]
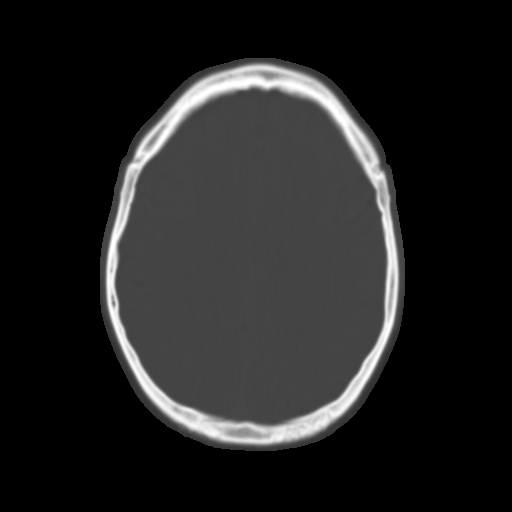
[im 24/32  brain]
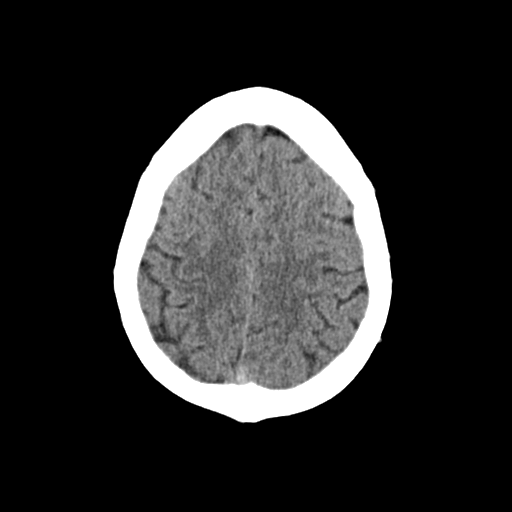
[im 28/32  brain]
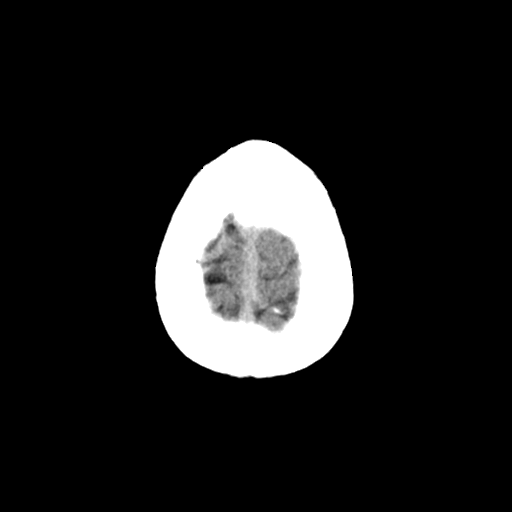

[Series 3: head bone · axial · 0.46mm/px · z∈[-368,-256]mm · 7 of 80 slices shown]
[im 8/80  bone]
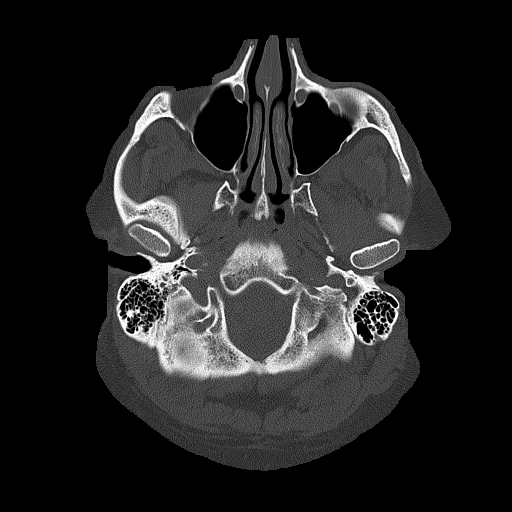
[im 16/80  bone]
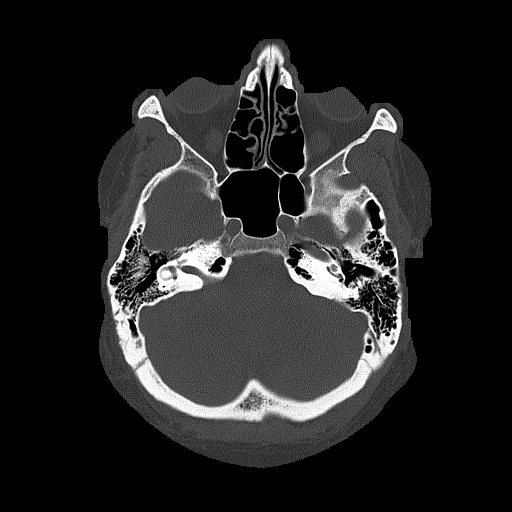
[im 24/80  bone]
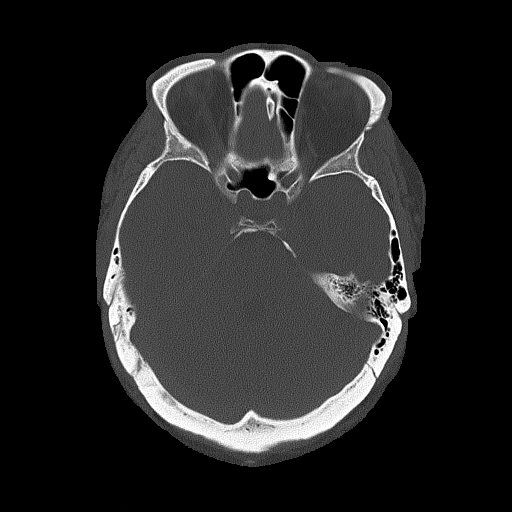
[im 36/80  bone]
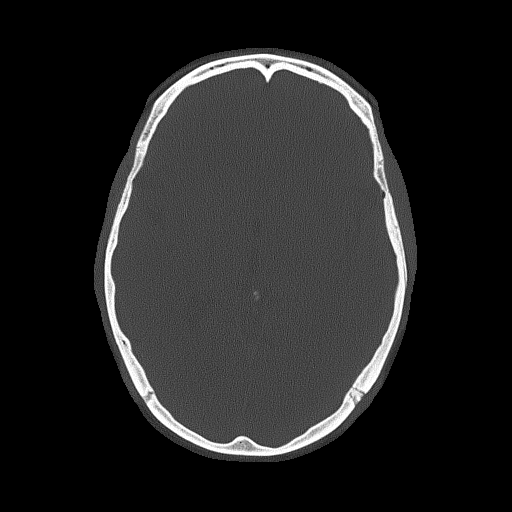
[im 44/80  bone]
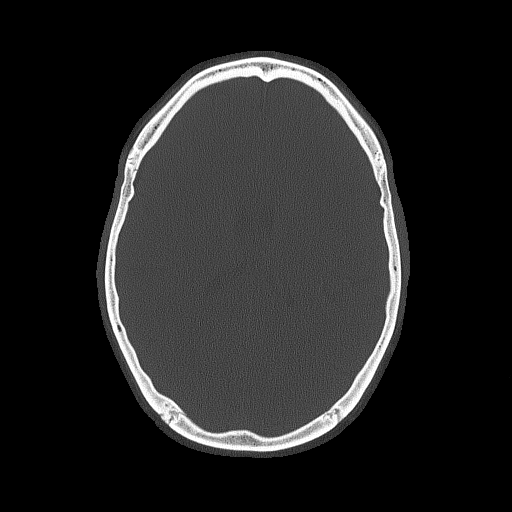
[im 56/80  bone]
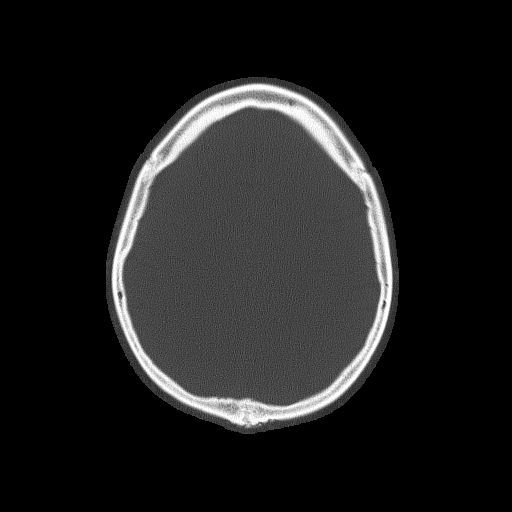
[im 64/80  bone]
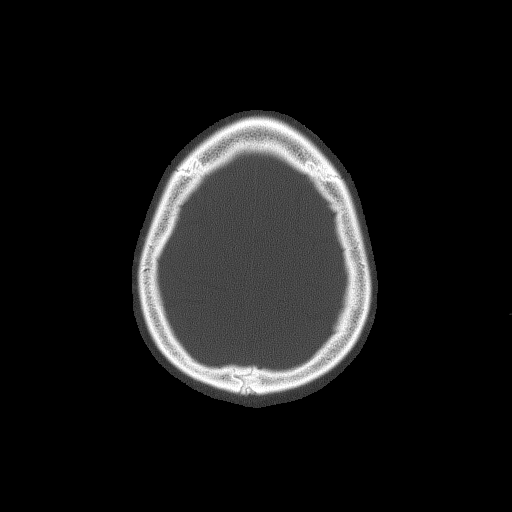

[Series 4: coronal soft · coronal · 0.34mm/px · 3 of 78 slices shown]
[im 20/78  brain]
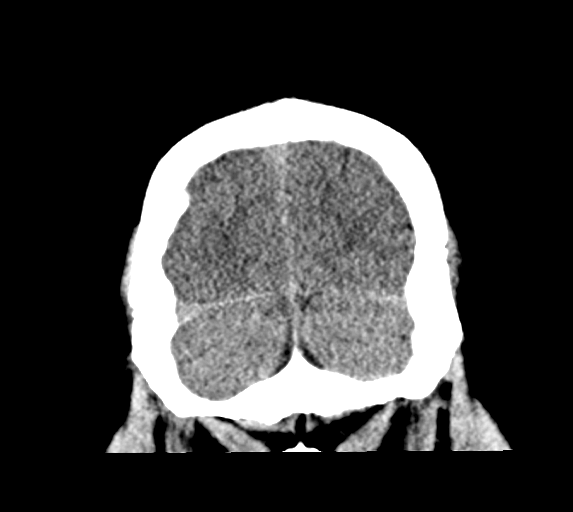
[im 39/78  brain]
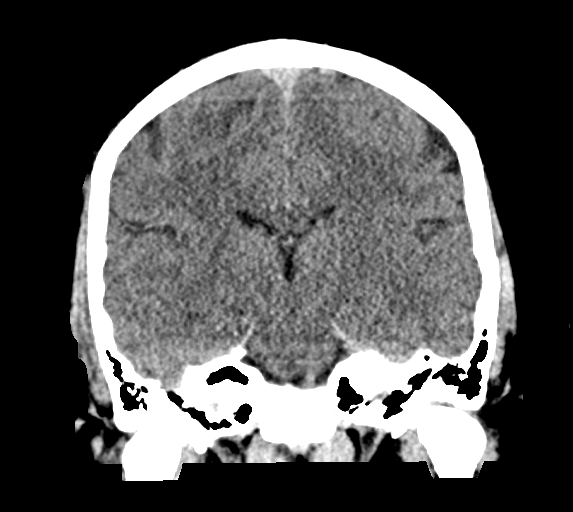
[im 58/78  brain]
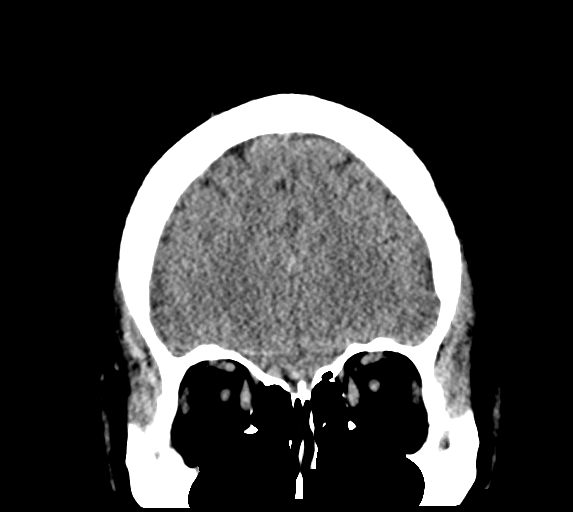

[17 of 40 positions shown; findings below may reference images not displayed]

FINDINGS: Brain: No evidence of acute infarction, hemorrhage, hydrocephalus,
extra-axial collection or mass lesion/mass effect.

Vascular: No hyperdense vessel or unexpected calcification.

Skull: Normal. Negative for fracture or focal lesion.

Sinuses/Orbits: No acute finding.

Other: None.
IMPRESSION: 1. No acute intracranial abnormalities. The appearance of the brain
is normal.

## 2022-06-02 ENCOUNTER — Encounter: Payer: Self-pay | Admitting: Physician Assistant

## 2022-06-02 ENCOUNTER — Telehealth: Payer: BC Managed Care – PPO | Admitting: Physician Assistant

## 2022-06-02 DIAGNOSIS — J069 Acute upper respiratory infection, unspecified: Secondary | ICD-10-CM

## 2022-06-02 DIAGNOSIS — J4521 Mild intermittent asthma with (acute) exacerbation: Secondary | ICD-10-CM

## 2022-06-02 MED ORDER — PROMETHAZINE-DM 6.25-15 MG/5ML PO SYRP: 5.0000 mL | ORAL_SOLUTION | Freq: Four times a day (QID) | ORAL | 0 refills | Status: DC | PRN

## 2022-06-02 MED ORDER — BENZONATATE 100 MG PO CAPS: 100.0000 mg | ORAL_CAPSULE | Freq: Three times a day (TID) | ORAL | 0 refills | Status: DC | PRN

## 2022-06-02 MED ORDER — AZELASTINE HCL 0.1 % NA SOLN: 1.0000 | Freq: Two times a day (BID) | NASAL | 0 refills | Status: DC

## 2022-06-02 NOTE — Progress Notes (Signed)
Virtual Visit Consent   Nicholas Anthony, you are scheduled for a virtual visit with a [facility] provider today. Just as with appointments in the office, your consent must be obtained to participate. Your consent will be active for this visit and any virtual visit you may have with one of our providers in the next 365 days. If you have a MyChart account, a copy of this consent can be sent to you electronically.  As this is a virtual visit, video technology does not allow for your provider to perform a traditional examination. This may limit your provider's ability to fully assess your condition. If your provider identifies any concerns that need to be evaluated in person or the need to arrange testing (such as labs, EKG, etc.), we will make arrangements to do so. Although advances in technology are sophisticated, we cannot ensure that it will always work on either your end or our end. If the connection with a video visit is poor, the visit may have to be switched to a telephone visit. With either a video or telephone visit, we are not always able to ensure that we have a secure connection.  By engaging in this virtual visit, you consent to the provision of healthcare and authorize for your insurance to be billed (if applicable) for the services provided during this visit. Depending on your insurance coverage, you may receive a charge related to this service.  I need to obtain your verbal consent now. Are you willing to proceed with your visit today? Nicholas Anthony has provided verbal consent on 06/02/2022 for a virtual visit (video or telephone). Leeanne Rio, [place]  Date: 06/02/2022 7:44 PM  Virtual Visit via Video Note   I, Leeanne Rio, connected with  Nicholas Anthony  ([id number], 1980/10/10) on 06/02/22 at  7:30 PM EDT by a video-enabled telemedicine application and verified that I am speaking with the correct person using two identifiers.  Location: Patient: Virtual Visit Location  Patient: Home Provider: Virtual Visit Location Provider: Home Office   I discussed the limitations of evaluation and management by telemedicine and the availability of in person appointments. The patient expressed understanding and agreed to proceed.    History of Present Illness: Nicholas Anthony is a 42 y.o. who identifies as a male who was assigned male at birth, and is being seen today for 3 days of ongoing and progressive cough that is productive and associated with a good amount of wheezing. Phlegm is yellow/green but has been that way since onset. Denies fever, chills. Did test for COVID on Sunday which was negative. Submitted an e-visit earlier where it was recommended he retest to be cautious and to follow-up via video. He has not retested. He has not picked up or started the cough medications sent in earlier for him. Notes use of is albuterol inhaler every 3-4 hours over the past day. Denies chest pain. SOB only with substantial exertion. Has been mainly resting. Does not have any more home COVID tests at present.   HPI: HPI  Problems:  Patient Active Problem List   Diagnosis Date Noted   GERD (gastroesophageal reflux disease) 10/20/2021   Plantar fasciitis, chronic 08/27/2020   Allergic rhinitis 07/09/2019   Mild intermittent asthma 04/30/2019   Eustachian tube dysfunction, left 04/30/2019   Ophthalmologic abnormality 09/12/2017   Obesity, Class III, BMI 40-49.9 (morbid obesity) ([place]) 09/12/2017   Essential hypertension 09/12/2017    Allergies:  Allergies  Allergen Reactions   Penicillins Rash  Rash only-as a child    Medications:  Current Outpatient Medications:    albuterol (VENTOLIN HFA) 108 (90 Base) MCG/ACT inhaler, INHALE 2 PUFFS BY MOUTH EVERY 4 HOURS AS NEEDED FOR WHEEZING OR SHORTNESS OF BREATH, Disp: 8.5 g, Rfl: 5   azelastine (ASTELIN) 0.1 % nasal spray, Place 1 spray into both nostrils 2 (two) times daily. Use in each nostril as directed, Disp: 30 mL, Rfl: 0    benzonatate (TESSALON) 100 MG capsule, Take 1 capsule (100 mg total) by mouth 3 (three) times daily as needed., Disp: 30 capsule, Rfl: 0   fluticasone (FLONASE) 50 MCG/ACT nasal spray, USE 2 SPRAY IN EACH NOSTRIL DAILY, Disp: 16 g, Rfl: 5   irbesartan (AVAPRO) 150 MG tablet, Take 1 tablet (150 mg total) by mouth daily., Disp: 30 tablet, Rfl: 5   omeprazole (PRILOSEC) 20 MG capsule, Take 1 capsule (20 mg total) by mouth daily., Disp: 30 capsule, Rfl: 5   promethazine-dextromethorphan (PROMETHAZINE-DM) 6.25-15 MG/5ML syrup, Take 5 mLs by mouth 4 (four) times daily as needed., Disp: 118 mL, Rfl: 0  Observations/Objective: Patient is well-developed, well-nourished in no acute distress.  Resting comfortably at home.  Head is normocephalic, atraumatic.  No labored breathing. Audible wheezing.  Speech is clear and coherent with logical content.  Patient is alert and oriented at baseline.   Assessment and Plan: 1. Viral upper respiratory tract infection  2. Mild intermittent asthma with exacerbation  Agree with earlier provider for him to retest for COVID to be cautious. He states he can pick up first thing in morning with his medications. Sent pt message to allow him to reply directly to me with result so we can make any needed changes. Supportive measures and OTC medications reviewed. Ok to use cough medications prescribed at time of EV. Will add on burst of prednisone due to asthma exacerbation. Strict ER precautions reviewed. He is to continue quarantine until COVID testing is repeated in the AM.   Follow Up Instructions: I discussed the assessment and treatment plan with the patient. The patient was provided an opportunity to ask questions and all were answered. The patient agreed with the plan and demonstrated an understanding of the instructions.  A copy of instructions were sent to the patient via MyChart unless otherwise noted below.   The patient was advised to call back or seek an  in-person evaluation if the symptoms worsen or if the condition fails to improve as anticipated.  Time:  I spent 12 minutes with the patient via telehealth technology discussing the above problems/concerns.    Leeanne Rio, PA-C

## 2022-06-02 NOTE — Progress Notes (Signed)
E-Visit for [employer] Virus / COVID Screening  Your current symptoms could be consistent with COVID.  Please complete a Covid test either at home or check with your local pharmacy to see if they provide testing.    If you test positive for Covid (meaning that you were infected with the novel coronavirus and could give the virus to others)  It is vitally important that you stay home so you do not spread it to others. Our team offers Virtual Urgent Care appointments via MyChart if you are interested in available treatments for Covid.   Please isolate yourself while awaiting your test results.   The current recommendation for Covid is to isolate for 5 days from onset of your symptoms. If you must be around other household members who do not have symptoms, you need to make sure that both you and the family members are masking consistently with a high-quality mask.  After day 5 of isolation, if you have had no fever within 24 hours and you are feeling better, you can end isolation but need to mask for an additional 5 days.  After day 5 if you have a fever or are having significant symptoms, please isolate for full 10 days.  If you note any worsening of symptoms despite treatment, please seek an in-person evaluation ASAP. If you note any significant shortness of breath or any chest pain, please seek ER evaluation. Please do not delay care!  Go to the nearest hospital ED for assessment if fever/cough/breathlessness are severe or illness seems like a threat to life.    The following symptoms may appear 2-14 days after exposure: Fever Cough Shortness of breath or difficulty breathing Chills Repeated shaking with chills Muscle pain Headache Sore throat New loss of taste or smell Fatigue Congestion or runny nose Nausea or vomiting Diarrhea  You can use medication such as prescription cough medication called Tessalon Perles 100 mg. You may take 1-2 capsules every 8 hours as needed for cough,  prescription cough medication called Phenergan DM 6.25 mg/15 mg. You make take one teaspoon / 5 ml every 4-6 hours as needed for cough, and prescription for Azelastine nasal spray 2 sprays in each nostril twice per day  You may also take acetaminophen (Tylenol) as needed for fever.  HOME CARE Only take medications as instructed by your medical team. Drink plenty of fluids and get plenty of rest. A steam or ultrasonic humidifier can help if you have congestion.  GET HELP RIGHT AWAY IF YOU HAVE EMERGENCY WARNING SIGNS.  Call 911 or proceed to your closest emergency facility if: You develop worsening high fever. Trouble breathing Bluish lips or face Persistent pain or pressure in the chest New confusion Inability to wake or stay awake You cough up blood. Your symptoms become more severe Inability to hold down food or fluids  This list is not all possible symptoms. Contact your medical provider for any symptoms that are severe or concerning to you.   Your e-visit answers were reviewed by a board certified advanced clinical practitioner to complete your personal care plan.  Depending on the condition, your plan could have included both over the counter or prescription medications.  If there is a problem, please reply once you have received a response from your provider.  Your safety is important to [place].  If you have drug allergies check your prescription carefully.    You can use MyChart to ask questions about today's visit, request a non-urgent call back, or ask for  a work or school excuse for 24 hours related to this e-Visit. If it has been greater than 24 hours you will need to follow up with your provider or enter a new e-Visit to address those concerns. You will get an e-mail in the next two days asking about your experience.  I hope that your e-visit has been valuable and will speed your recovery. Thank you for using e-visits.    I have spent 5 minutes in review of e-visit  questionnaire, review and updating patient chart, medical decision making and response to patient.   Mar Daring, PA-C

## 2022-06-02 NOTE — Patient Instructions (Signed)
  Nicholas Anthony, thank you for joining Leeanne Rio, PA-C for today's virtual visit.  While this provider is not your primary care provider (PCP), if your PCP is located in our provider database this encounter information will be shared with them immediately following your visit.   [facility] account gives you access to today's visit and all your visits, tests, and labs performed at [facility] " click here if you don't have a [facility] account or go to [email or web address]  Consent: (Patient) Nicholas Anthony provided verbal consent for this virtual visit at the beginning of the encounter.  Current Medications:  Current Outpatient Medications:    albuterol (VENTOLIN HFA) 108 (90 Base) MCG/ACT inhaler, INHALE 2 PUFFS BY MOUTH EVERY 4 HOURS AS NEEDED FOR WHEEZING OR SHORTNESS OF BREATH, Disp: 8.5 g, Rfl: 5   azelastine (ASTELIN) 0.1 % nasal spray, Place 1 spray into both nostrils 2 (two) times daily. Use in each nostril as directed, Disp: 30 mL, Rfl: 0   benzonatate (TESSALON) 100 MG capsule, Take 1 capsule (100 mg total) by mouth 3 (three) times daily as needed., Disp: 30 capsule, Rfl: 0   fluticasone (FLONASE) 50 MCG/ACT nasal spray, USE 2 SPRAY IN EACH NOSTRIL DAILY, Disp: 16 g, Rfl: 5   irbesartan (AVAPRO) 150 MG tablet, Take 1 tablet (150 mg total) by mouth daily., Disp: 30 tablet, Rfl: 5   omeprazole (PRILOSEC) 20 MG capsule, Take 1 capsule (20 mg total) by mouth daily., Disp: 30 capsule, Rfl: 5   promethazine-dextromethorphan (PROMETHAZINE-DM) 6.25-15 MG/5ML syrup, Take 5 mLs by mouth 4 (four) times daily as needed., Disp: 118 mL, Rfl: 0   Medications ordered in this encounter:  No orders of the defined types were placed in this encounter.    *If you need refills on other medications prior to your next appointment, please contact your pharmacy*  Follow-Up: Call back or seek an in-person evaluation if the symptoms worsen or if the condition  fails to improve as anticipated.  [place] [phone]  Other Instructions Please retest for COVID in the morning as discussed. Message me with the results as soon as you have them.  Keep hydrated. Mucinex OTC for congestion. Ok to take the cough medications sent in earlier by the e-visit provider.  Take the prednisone as directed to further help with wheezing/asthma. Take albuterol as directed.  We will adjust treatment if the repeat test were to come back positive.  ER for any acutely worsening symptoms. DO NOT DELAY CARE.    If you have been instructed to have an in-person evaluation today at a local Urgent Care facility, please use the link below. It will take you to a list of all of our available [facility], including address, phone number and hours of operation. Please do not delay care.  [facility]  If you or a family member do not have a primary care provider, use the link below to schedule a visit and establish care. When you choose a [facility] primary care physician or advanced practice provider, you gain a long-term partner in health. Find a Primary Care Provider  Learn more about [facility]'s in-office and virtual care options: [facility]

## 2022-06-17 DIAGNOSIS — M7731 Calcaneal spur, right foot: Secondary | ICD-10-CM

## 2022-06-17 HISTORY — DX: Calcaneal spur, right foot: M77.31

## 2022-09-27 ENCOUNTER — Ambulatory Visit (INDEPENDENT_AMBULATORY_CARE_PROVIDER_SITE_OTHER): Payer: Self-pay | Admitting: Family Medicine

## 2022-09-27 ENCOUNTER — Encounter: Payer: Self-pay | Admitting: Family Medicine

## 2022-09-27 VITALS — BP 144/83 | HR 51 | Temp 97.7°F | Wt >= 6400 oz

## 2022-09-27 DIAGNOSIS — Z6841 Body Mass Index (BMI) 40.0 and over, adult: Secondary | ICD-10-CM

## 2022-09-27 DIAGNOSIS — I10 Essential (primary) hypertension: Secondary | ICD-10-CM

## 2022-09-27 MED ORDER — FLUTICASONE PROPIONATE 50 MCG/ACT NA SUSP: NASAL | 5 refills | Status: DC

## 2022-09-27 MED ORDER — IRBESARTAN 300 MG PO TABS: 300.0000 mg | ORAL_TABLET | Freq: Every day | ORAL | 6 refills | Status: DC

## 2022-09-27 MED ORDER — ALBUTEROL SULFATE HFA 108 (90 BASE) MCG/ACT IN AERS: INHALATION_SPRAY | RESPIRATORY_TRACT | 5 refills | Status: DC

## 2022-09-27 MED ORDER — OMEPRAZOLE 20 MG PO CPDR: 20.0000 mg | DELAYED_RELEASE_CAPSULE | Freq: Every day | ORAL | 5 refills | Status: DC

## 2022-09-27 NOTE — Progress Notes (Signed)
Patient ID: Nicholas Anthony, male  DOB: 10-30-1980, 42 y.o.   MRN: [id number] Patient Care Team    Relationship Specialty Notifications Start End  Natalia Leatherwood, DO PCP - General Family Medicine  09/12/17   Clark-Burning, Gertie Baron (Inactive)  Dermatology  10/17/19     Chief Complaint  Patient presents with   Hypertension    Subjective:  Nicholas Anthony is a 42 y.o. male present for  Chronic Conditions/illness Management All past medical history, surgical history, allergies, family history, immunizations, medications and social history were updated in the electronic medical record today. All recent labs, ED visits and hospitalizations within the last year were reviewed.  Hypertension/morbid obesity/ophthalmological abnormality:  Patient reports compliance with   irbesartan 150 mg qd.  Patient denies chest pain, shortness of breath, dizziness or lower extremity edema.  He has gained 22 lbs since January.  Pt does not take a  daily baby ASA. Pt is is not  prescribed statin. Diet: low sodium Exercise: encouraged routinely.  RF: HTN, obesity, Fhx heart disease       09/27/2022    8:30 AM 10/20/2021    1:08 PM 01/02/2021    8:01 AM 07/25/2020    1:40 PM 02/06/2020    3:36 PM  Depression screen PHQ 2/9  Decreased Interest 0 0 0 0 0  Down, Depressed, Hopeless 0 0 0 0 0  PHQ - 2 Score 0 0 0 0 0  Altered sleeping 0   0   Tired, decreased energy 0   3   Change in appetite 0   3   Feeling bad or failure about yourself  0   0   Trouble concentrating 0   0   Moving slowly or fidgety/restless 0   0   Suicidal thoughts 0   0   PHQ-9 Score 0   6   Difficult doing work/chores Not difficult at all          09/27/2022    8:30 AM 07/25/2020    1:40 PM 07/24/2019    3:02 PM 12/14/2018    8:12 AM  GAD 7 : Generalized Anxiety Score  Nervous, Anxious, on Edge 0 0 1 0  Control/stop worrying 0 0 0 0  Worry too much - different things 0 0 1 0  Trouble relaxing 0 0 3 0  Restless 0 0 3 0   Easily annoyed or irritable 0 0 0 0  Afraid - awful might happen 0 0 0 0  Total GAD 7 Score 0 0 8 0  Anxiety Difficulty Not difficult at all  Somewhat difficult Not difficult at all           09/27/2022    8:29 AM 09/12/2017    9:37 AM  Fall Risk   Falls in the past year? 0 No  Injury with Fall? 0   Risk for fall due to : No Fall Risks   Follow up Falls evaluation completed     Immunization History  Administered Date(s) Administered   DTaP 09/01/1981, 11/03/1981, 12/29/1981, 07/19/1984, 07/17/1986   IPV 09/01/1981, 11/03/1981, 03/02/1982, 07/17/1986   Influenza Split 03/30/2014   Influenza,inj,Quad PF,6+ Mos 11/25/2017, 12/14/2018, 01/02/2021, 04/12/2022   MMR 10/12/1982   PPD Test 10/20/2021   Tdap 03/30/2014    Past Medical History:  Diagnosis Date   Allergy    Anal fissure    Asthma    Attention deficit 11/25/2017   Bilateral inguinal hernia (BIH) 05/21/2019  Calcaneal spur of foot, right 06/17/2022   Chicken pox    GERD (gastroesophageal reflux disease)    HTN (hypertension)    Mild anxiety    Prediabetes 10/03/2017   Retinal hemorrhage 09/02/2017   Tick bite 12/14/2018   Allergies  Allergen Reactions   Penicillins Rash    Rash only-as a child    Past Surgical History:  Procedure Laterality Date   HERNIA REPAIR     PLANTAR FASCIA RELEASE     SPHINCTEROTOMY  2020   Family History  Problem Relation Age of Onset   Diabetes Father    Heart disease Father    Hyperlipidemia Father    Hypertension Father    Drug abuse Sister    Alcohol abuse Sister    Arthritis Paternal Grandmother    Depression Paternal Grandmother    Diabetes Paternal Grandmother    Hyperlipidemia Paternal Grandmother    Heart disease Paternal Grandmother    Hypertension Paternal Grandmother    Arthritis Paternal Grandfather    Hearing loss Paternal Grandfather    Heart disease Paternal Grandfather    Hyperlipidemia Paternal Grandfather    Hypertension Paternal Grandfather     Kidney disease Paternal Grandfather    Drug abuse Brother    Social History   Social History Narrative   Married, employed,    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    Drinks caffeine.    Smoke alarm in the home. Wears his seatbelt.    Feels safe in his relationship.     Allergies as of 09/27/2022       Reactions   Penicillins Rash   Rash only-as a child        Medication List        Accurate as of September 27, 2022  8:51 AM. If you have any questions, ask your nurse or doctor.          STOP taking these medications    benzonatate 100 MG capsule Commonly known as: TESSALON Stopped by: Felix Pacini, DO   promethazine-dextromethorphan 6.25-15 MG/5ML syrup Commonly known as: PROMETHAZINE-DM Stopped by: Felix Pacini, DO       TAKE these medications    albuterol 108 (90 Base) MCG/ACT inhaler Commonly known as: VENTOLIN HFA INHALE 2 PUFFS BY MOUTH EVERY 4 HOURS AS NEEDED FOR WHEEZING OR SHORTNESS OF BREATH   azelastine 0.1 % nasal spray Commonly known as: ASTELIN Place 1 spray into both nostrils 2 (two) times daily. Use in each nostril as directed   fluticasone 50 MCG/ACT nasal spray Commonly known as: FLONASE USE 2 SPRAY IN EACH NOSTRIL DAILY   irbesartan 300 MG tablet Commonly known as: Avapro Take 1 tablet (300 mg total) by mouth daily. What changed:  medication strength how much to take Changed by: Felix Pacini, DO   omeprazole 20 MG capsule Commonly known as: PRILOSEC Take 1 capsule (20 mg total) by mouth daily.       All past medical history, surgical history, allergies, family history, immunizations andmedications were updated in the EMR today and reviewed under the history and medication portions of their EMR.     No results found for this or any previous visit (from the past 2160 hour(s)).   No results found.  ROS: 14 pt review of systems performed and negative (unless mentioned in an HPI)  Objective: BP (!) 144/83    Pulse (!) 51   Temp 97.7 F (36.5 C)   Wt (!) 402 lb 4.8 oz (182.5  kg)   SpO2 96%   BMI 51.09 kg/m  Physical Exam Vitals and nursing note reviewed.  Constitutional:      General: He is not in acute distress.    Appearance: Normal appearance. He is obese. He is not ill-appearing, toxic-appearing or diaphoretic.  HENT:     Head: Normocephalic and atraumatic.  Eyes:     General: No scleral icterus.       Right eye: No discharge.        Left eye: No discharge.     Extraocular Movements: Extraocular movements intact.     Pupils: Pupils are equal, round, and reactive to light.  Cardiovascular:     Rate and Rhythm: Normal rate and regular rhythm.  Pulmonary:     Effort: Pulmonary effort is normal. No respiratory distress.     Breath sounds: Normal breath sounds. No wheezing, rhonchi or rales.  Musculoskeletal:     Right lower leg: No edema.     Left lower leg: No edema.  Skin:    General: Skin is warm.     Findings: No rash.  Neurological:     Mental Status: He is alert and oriented to person, place, and time. Mental status is at baseline.  Psychiatric:        Mood and Affect: Mood normal.        Behavior: Behavior normal.        Thought Content: Thought content normal.        Judgment: Judgment normal.    No results found.  Assessment/plan: Payne Teskey is a 42 y.o. male present for Chronic Conditions/illness Management Hypertension/obesity:  Above goal x3 checks today Increase Avapro to 300 mg qd - Continue diet and weight loss regimen, routine exercise.  -Low sodium diet. Labs due next visit  Gastroesophageal reflux disease without esophagitis Stable  continue protonix   Return in about 7 months (around 04/14/2023) for cpe (20 min), Routine chronic condition follow-up.  No orders of the defined types were placed in this encounter.  Meds ordered this encounter  Medications   albuterol (VENTOLIN HFA) 108 (90 Base) MCG/ACT inhaler    Sig: INHALE 2 PUFFS BY  MOUTH EVERY 4 HOURS AS NEEDED FOR WHEEZING OR SHORTNESS OF BREATH    Dispense:  8.5 g    Refill:  5   fluticasone (FLONASE) 50 MCG/ACT nasal spray    Sig: USE 2 SPRAY IN EACH NOSTRIL DAILY    Dispense:  16 g    Refill:  5   irbesartan (AVAPRO) 300 MG tablet    Sig: Take 1 tablet (300 mg total) by mouth daily.    Dispense:  30 tablet    Refill:  6   omeprazole (PRILOSEC) 20 MG capsule    Sig: Take 1 capsule (20 mg total) by mouth daily.    Dispense:  30 capsule    Refill:  5   Referral Orders  No referral(s) requested today     Note is dictated utilizing voice recognition software. Although note has been proof read prior to signing, occasional typographical errors still can be missed. If any questions arise, please do not hesitate to call for verification.  Electronically signed by: Felix Pacini, DO [facility]

## 2022-09-27 NOTE — Patient Instructions (Signed)
Return in about 7 months (around 04/14/2023) for cpe (20 min), Routine chronic condition follow-up.   Low sodium diet and exercise encouraged to help with BP.       Great to see you today.  I have refilled the medication(s) we provide.   If labs were collected, we will inform you of lab results once received either by echart message or telephone call.   - echart message- for normal results that have been seen by the patient already.   - telephone call: abnormal results or if patient has not viewed results in their echart.

## 2023-02-13 ENCOUNTER — Ambulatory Visit
Admission: EM | Admit: 2023-02-13 | Discharge: 2023-02-13 | Disposition: A | Discharge status: Home / Self Care | Payer: Self-pay | Attending: Nurse Practitioner | Admitting: Nurse Practitioner

## 2023-02-13 DIAGNOSIS — J069 Acute upper respiratory infection, unspecified: Secondary | ICD-10-CM

## 2023-02-13 LAB — POCT RAPID STREP A (OFFICE): Rapid Strep A Screen: NEGATIVE

## 2023-02-13 MED ORDER — CETIRIZINE-PSEUDOEPHEDRINE ER 5-120 MG PO TB12: 1.0000 | ORAL_TABLET | Freq: Every day | ORAL | 0 refills | Status: DC

## 2023-02-13 MED ORDER — PROMETHAZINE-DM 6.25-15 MG/5ML PO SYRP: 5.0000 mL | ORAL_SOLUTION | Freq: Four times a day (QID) | ORAL | 0 refills | Status: DC | PRN

## 2023-02-13 NOTE — Discharge Instructions (Addendum)
Take medication as prescribed. Increase fluids and allow for plenty of rest. May take over-the-counter Tylenol or ibuprofen as needed for pain, fever, general discomfort. Warm salt water gargles 3-4 times daily as needed for throat pain or discomfort. Apply warm compresses to the ears as needed for pain or discomfort. Normal saline nasal spray throughout the day for nasal congestion and runny nose. For your cough, recommend using a humidifier in the bedroom at nighttime during sleep and sleeping elevated on pillows while symptoms persist. If symptoms do not improve with this treatment, you may follow-up in this clinic or with your primary care physician for further evaluation. Follow-up as needed.

## 2023-02-13 NOTE — ED Provider Notes (Signed)
[facility]    CSN: [id number] Arrival date & time: 02/13/23  1327      History   Chief Complaint Chief Complaint  Patient presents with   Sore Throat    HPI Nicholas Anthony is a 42 y.o. male.   The history is provided by the patient.   Patient presents for complaints of sore throat, cough, and bilateral ear pain that has been present for the past 5 days.  Patient denies fever, chills, ear drainage, wheezing, difficulty breathing, chest pain, abdominal pain, nausea, vomiting, diarrhea, or rash.  Patient does have history of asthma, states that his asthma has been controlled since his symptoms started.  He reports he has been taking over-the-counter cough and cold medications for  symptoms with minimal relief.  Past Medical History:  Diagnosis Date   Allergy    Anal fissure    Asthma    Attention deficit 11/25/2017   Bilateral inguinal hernia (BIH) 05/21/2019   Calcaneal spur of foot, right 06/17/2022   Chicken pox    GERD (gastroesophageal reflux disease)    HTN (hypertension)    Mild anxiety    Prediabetes 10/03/2017   Retinal hemorrhage 09/02/2017   Tick bite 12/14/2018    Patient Active Problem List   Diagnosis Date Noted   GERD (gastroesophageal reflux disease) 10/20/2021   Plantar fasciitis, chronic 08/27/2020   Allergic rhinitis 07/09/2019   Mild intermittent asthma 04/30/2019   Eustachian tube dysfunction, left 04/30/2019   Ophthalmologic abnormality 09/12/2017   Morbid obesity with BMI of 50.0-59.9, adult (HCC) 09/12/2017   Essential hypertension 09/12/2017    Past Surgical History:  Procedure Laterality Date   HERNIA REPAIR     PLANTAR FASCIA RELEASE     SPHINCTEROTOMY  2020       Home Medications    Prior to Admission medications   Medication Sig Start Date End Date Taking? Authorizing Provider  cetirizine-pseudoephedrine (ZYRTEC-D) 5-120 MG tablet Take 1 tablet by mouth daily. 02/13/23  Yes Leath-Warren, Sadie Haber, NP   promethazine-dextromethorphan (PROMETHAZINE-DM) 6.25-15 MG/5ML syrup Take 5 mLs by mouth 4 (four) times daily as needed. 02/13/23  Yes Leath-Warren, Sadie Haber, NP  albuterol (VENTOLIN HFA) 108 (90 Base) MCG/ACT inhaler INHALE 2 PUFFS BY MOUTH EVERY 4 HOURS AS NEEDED FOR WHEEZING OR SHORTNESS OF BREATH 09/27/22   Kuneff, Renee A, DO  azelastine (ASTELIN) 0.1 % nasal spray Place 1 spray into both nostrils 2 (two) times daily. Use in each nostril as directed 06/02/22   Margaretann Loveless, PA-C  fluticasone [facility]) 50 MCG/ACT nasal spray USE 2 SPRAY IN EACH NOSTRIL DAILY 09/27/22   Kuneff, Renee A, DO  irbesartan (AVAPRO) 300 MG tablet Take 1 tablet (300 mg total) by mouth daily. 09/27/22   Kuneff, Renee A, DO  omeprazole (PRILOSEC) 20 MG capsule Take 1 capsule (20 mg total) by mouth daily. 09/27/22   Natalia Leatherwood, DO    Family History Family History  Problem Relation Age of Onset   Diabetes Father    Heart disease Father    Hyperlipidemia Father    Hypertension Father    Drug abuse Sister    Alcohol abuse Sister    Arthritis Paternal Grandmother    Depression Paternal Grandmother    Diabetes Paternal Grandmother    Hyperlipidemia Paternal Grandmother    Heart disease Paternal Grandmother    Hypertension Paternal Grandmother    Arthritis Paternal Grandfather    Hearing loss Paternal Grandfather    Heart disease Paternal Grandfather  Hyperlipidemia Paternal Grandfather    Hypertension Paternal Grandfather    Kidney disease Paternal Grandfather    Drug abuse Brother     Social History Social History   Tobacco Use   Smoking status: Never   Smokeless tobacco: Never  Vaping Use   Vaping status: Never Used  Substance Use Topics   Alcohol use: Never   Drug use: Never     Allergies   Penicillins   Review of Systems Review of Systems Per HPI  Physical Exam Triage Vital Signs ED Triage Vitals  Encounter Vitals Group     BP 02/13/23 1357 (!) 149/87     Systolic BP  Percentile --      Diastolic BP Percentile --      Pulse Rate 02/13/23 1357 92     Resp 02/13/23 1357 18     Temp 02/13/23 1357 98.6 F (37 C)     Temp Source 02/13/23 1357 Oral     SpO2 02/13/23 1357 96 %     Weight --      Height --      Head Circumference --      Peak Flow --      Pain Score 02/13/23 1358 6     Pain Loc --      Pain Education --      Exclude from Growth Chart --    No data found.  Updated Vital Signs BP (!) 149/87 (BP Location: Right Arm)   Pulse 92   Temp 98.6 F (37 C) (Oral)   Resp 18   SpO2 96%   Visual Acuity Right Eye Distance:   Left Eye Distance:   Bilateral Distance:    Right Eye Near:   Left Eye Near:    Bilateral Near:     Physical Exam Vitals and nursing note reviewed.  Constitutional:      Appearance: He is well-developed.  HENT:     Head: Normocephalic.     Right Ear: Tympanic membrane and ear canal normal.     Left Ear: Tympanic membrane and ear canal normal.     Nose: Congestion present.     Right Turbinates: Enlarged and swollen.     Left Turbinates: Enlarged and swollen.     Right Sinus: No maxillary sinus tenderness or frontal sinus tenderness.     Left Sinus: No maxillary sinus tenderness or frontal sinus tenderness.     Mouth/Throat:     Lips: Pink.     Mouth: Mucous membranes are moist.     Pharynx: Oropharynx is clear. Uvula midline. Posterior oropharyngeal erythema and postnasal drip present. No pharyngeal swelling, oropharyngeal exudate or uvula swelling.     Comments: Cobblestoning present to posterior oropharynx  Eyes:     Extraocular Movements: Extraocular movements intact.     Conjunctiva/sclera: Conjunctivae normal.     Pupils: Pupils are equal, round, and reactive to light.  Cardiovascular:     Rate and Rhythm: Normal rate and regular rhythm.     Pulses: Normal pulses.     Heart sounds: Normal heart sounds.  Pulmonary:     Effort: Pulmonary effort is normal. No respiratory distress.     Breath sounds:  Normal breath sounds. No stridor. No wheezing, rhonchi or rales.  Abdominal:     General: Bowel sounds are normal.     Palpations: Abdomen is soft.     Tenderness: There is no abdominal tenderness.  Musculoskeletal:     Cervical back: Normal range of motion.  Skin:    General: Skin is warm and dry.  Neurological:     General: No focal deficit present.     Mental Status: He is alert and oriented to person, place, and time.  Psychiatric:        Mood and Affect: Mood normal.        Behavior: Behavior normal.      UC Treatments / Results  Labs (all labs ordered are listed, but only abnormal results are displayed) Labs Reviewed  POCT RAPID STREP A (OFFICE)    EKG   Radiology No results found.  Procedures Procedures (including critical care time)  Medications Ordered in UC Medications - No data to display  Initial Impression / Assessment and Plan / UC Course  I have reviewed the triage vital signs and the nursing notes.  Pertinent labs & imaging results that were available during my care of the patient were reviewed by me and considered in my medical decision making (see chart for details).  The rapid strep test was negative, throat culture is pending.  Viral testing is not indicated given the duration of the patient's symptoms.  Suspect patient may be experiencing a viral upper respiratory infection with cough.  Will provide symptomatic treatment with cetirizine D5-1 20 mg for postnasal drainage and nasal congestion, and Promethazine DM for his cough at nighttime.  Patient will use prior prescription of fluticasone nasal spray.  Supportive care recommendations were provided and discussed with the patient to include fluids, rest, over-the-counter analgesics, normal saline nasal spray, use of a humidifier.  Discussed indications with the patient of when follow-up be necessary.  Patient is in agreement with this plan of care and verbalizes understanding.  All questions were  answered.  Patient stable for discharge.  Final Clinical Impressions(s) / UC Diagnoses   Final diagnoses:  Viral upper respiratory tract infection with cough     Discharge Instructions      Take medication as prescribed. Increase fluids and allow for plenty of rest. May take over-the-counter Tylenol or ibuprofen as needed for pain, fever, general discomfort. Warm salt water gargles 3-4 times daily as needed for throat pain or discomfort. Apply warm compresses to the ears as needed for pain or discomfort. Normal saline nasal spray throughout the day for nasal congestion and runny nose. For your cough, recommend using a humidifier in the bedroom at nighttime during sleep and sleeping elevated on pillows while symptoms persist. If symptoms do not improve with this treatment, you may follow-up in this clinic or with your primary care physician for further evaluation. Follow-up as needed.     ED Prescriptions     Medication Sig Dispense Auth. Provider   cetirizine-pseudoephedrine (ZYRTEC-D) 5-120 MG tablet Take 1 tablet by mouth daily. 30 tablet Leath-Warren, Sadie Haber, NP   promethazine-dextromethorphan (PROMETHAZINE-DM) 6.25-15 MG/5ML syrup Take 5 mLs by mouth 4 (four) times daily as needed. 118 mL Leath-Warren, Sadie Haber, NP      PDMP not reviewed this encounter.   Abran Cantor, NP 02/13/23 1451

## 2023-02-13 NOTE — ED Triage Notes (Signed)
Pt reports throat pain and bilateral ear pain x 5 days

## 2023-02-25 ENCOUNTER — Ambulatory Visit
Admission: EM | Admit: 2023-02-25 | Discharge: 2023-02-25 | Disposition: A | Discharge status: Home / Self Care | Payer: Self-pay | Attending: Nurse Practitioner | Admitting: Nurse Practitioner

## 2023-02-25 DIAGNOSIS — Z8709 Personal history of other diseases of the respiratory system: Secondary | ICD-10-CM

## 2023-02-25 DIAGNOSIS — R059 Cough, unspecified: Secondary | ICD-10-CM

## 2023-02-25 DIAGNOSIS — J069 Acute upper respiratory infection, unspecified: Secondary | ICD-10-CM

## 2023-02-25 MED ORDER — PREDNISONE 20 MG PO TABS: 40.0000 mg | ORAL_TABLET | Freq: Every day | ORAL | 0 refills | Status: AC

## 2023-02-25 MED ORDER — PSEUDOEPH-BROMPHEN-DM 30-2-10 MG/5ML PO SYRP
5.0000 mL | ORAL_SOLUTION | Freq: Four times a day (QID) | ORAL | 0 refills | Status: DC | PRN
Start: 2023-02-25 — End: 2023-04-13

## 2023-02-25 MED ORDER — DOXYCYCLINE HYCLATE 100 MG PO TABS
100.0000 mg | ORAL_TABLET | Freq: Two times a day (BID) | ORAL | 0 refills | Status: AC
Start: 2023-02-25 — End: 2023-03-04

## 2023-02-25 NOTE — ED Triage Notes (Signed)
Pt reports he has a cough, bilateral ear pain, and mucus drainage x 12 days     Inhaler doesn't help sxs

## 2023-02-25 NOTE — ED Provider Notes (Signed)
[facility]    CSN: [id number] Arrival date & time: 02/25/23  1720      History   Chief Complaint No chief complaint on file.   HPI Nicholas Anthony is a 42 y.o. male.   The history is provided by the patient.   Patient presents for follow-up for continued cough, wheezing, nasal congestion, and ear pain.  Patient was seen in this clinic on 02/13/2023 and diagnosed with a viral URI with cough.  Patient states cough is worse at night.  Patient denies new onset fever, ear drainage, chest pain, abdominal pain, nausea, vomiting, or diarrhea.  Patient endorses that he has been using his albuterol inhaler for wheezing.  He states he has been taking the medication prescribed with minimal relief of his symptoms.  Past Medical History:  Diagnosis Date   Allergy    Anal fissure    Asthma    Attention deficit 11/25/2017   Bilateral inguinal hernia (BIH) 05/21/2019   Calcaneal spur of foot, right 06/17/2022   Chicken pox    GERD (gastroesophageal reflux disease)    HTN (hypertension)    Mild anxiety    Prediabetes 10/03/2017   Retinal hemorrhage 09/02/2017   Tick bite 12/14/2018    Patient Active Problem List   Diagnosis Date Noted   GERD (gastroesophageal reflux disease) 10/20/2021   Plantar fasciitis, chronic 08/27/2020   Allergic rhinitis 07/09/2019   Mild intermittent asthma 04/30/2019   Eustachian tube dysfunction, left 04/30/2019   Ophthalmologic abnormality 09/12/2017   Morbid obesity with BMI of 50.0-59.9, adult (HCC) 09/12/2017   Essential hypertension 09/12/2017    Past Surgical History:  Procedure Laterality Date   HERNIA REPAIR     PLANTAR FASCIA RELEASE     SPHINCTEROTOMY  2020       Home Medications    Prior to Admission medications   Medication Sig Start Date End Date Taking? Authorizing Provider  brompheniramine-pseudoephedrine-DM 30-2-10 MG/5ML syrup Take 5 mLs by mouth 4 (four) times daily as needed. 02/25/23  Yes Leath-Warren, Sadie Haber,  NP  doxycycline (VIBRA-TABS) 100 MG tablet Take 1 tablet (100 mg total) by mouth 2 (two) times daily for 7 days. 02/25/23 03/04/23 Yes Leath-Warren, Sadie Haber, NP  predniSONE (DELTASONE) 20 MG tablet Take 2 tablets (40 mg total) by mouth daily with breakfast for 5 days. 02/25/23 03/02/23 Yes Leath-Warren, Sadie Haber, NP  albuterol (VENTOLIN HFA) 108 (90 Base) MCG/ACT inhaler INHALE 2 PUFFS BY MOUTH EVERY 4 HOURS AS NEEDED FOR WHEEZING OR SHORTNESS OF BREATH 09/27/22   Kuneff, Renee A, DO  azelastine (ASTELIN) 0.1 % nasal spray Place 1 spray into both nostrils 2 (two) times daily. Use in each nostril as directed 06/02/22   Margaretann Loveless, PA-C  cetirizine-pseudoephedrine (ZYRTEC-D) 5-120 MG tablet Take 1 tablet by mouth daily. 02/13/23   Leath-Warren, Sadie Haber, NP  fluticasone (FLONASE) 50 MCG/ACT nasal spray USE 2 SPRAY IN EACH NOSTRIL DAILY 09/27/22   Kuneff, Renee A, DO  irbesartan (AVAPRO) 300 MG tablet Take 1 tablet (300 mg total) by mouth daily. 09/27/22   Kuneff, Renee A, DO  omeprazole (PRILOSEC) 20 MG capsule Take 1 capsule (20 mg total) by mouth daily. 09/27/22   Kuneff, Renee A, DO  promethazine-dextromethorphan (PROMETHAZINE-DM) 6.25-15 MG/5ML syrup Take 5 mLs by mouth 4 (four) times daily as needed. 02/13/23   Leath-Warren, Sadie Haber, NP    Family History Family History  Problem Relation Age of Onset   Diabetes Father    Heart disease Father  Hyperlipidemia Father    Hypertension Father    Drug abuse Sister    Alcohol abuse Sister    Arthritis Paternal Grandmother    Depression Paternal Grandmother    Diabetes Paternal Grandmother    Hyperlipidemia Paternal Grandmother    Heart disease Paternal Grandmother    Hypertension Paternal Grandmother    Arthritis Paternal Grandfather    Hearing loss Paternal Grandfather    Heart disease Paternal Grandfather    Hyperlipidemia Paternal Grandfather    Hypertension Paternal Grandfather    Kidney disease Paternal Grandfather    Drug  abuse Brother     Social History Social History   Tobacco Use   Smoking status: Never   Smokeless tobacco: Never  Vaping Use   Vaping status: Never Used  Substance Use Topics   Alcohol use: Never   Drug use: Never     Allergies   Penicillins   Review of Systems Review of Systems Per HPI  Physical Exam Triage Vital Signs ED Triage Vitals [02/25/23 1734]  Encounter Vitals Group     BP (!) 144/88     Systolic BP Percentile      Diastolic BP Percentile      Pulse Rate 75     Resp 20     Temp 98 F (36.7 C)     Temp Source Oral     SpO2 96 %     Weight      Height      Head Circumference      Peak Flow      Pain Score 4     Pain Loc      Pain Education      Exclude from Growth Chart    No data found.  Updated Vital Signs BP (!) 144/88 (BP Location: Right Arm)   Pulse 75   Temp 98 F (36.7 C) (Oral)   Resp 20   SpO2 96%   Visual Acuity Right Eye Distance:   Left Eye Distance:   Bilateral Distance:    Right Eye Near:   Left Eye Near:    Bilateral Near:     Physical Exam Vitals and nursing note reviewed.  Constitutional:      General: He is not in acute distress.    Appearance: Normal appearance.  HENT:     Head: Normocephalic.     Right Ear: Tympanic membrane, ear canal and external ear normal.     Left Ear: Tympanic membrane, ear canal and external ear normal.     Nose: Congestion present.     Right Turbinates: Enlarged and swollen.     Left Turbinates: Enlarged and swollen.     Right Sinus: No maxillary sinus tenderness or frontal sinus tenderness.     Left Sinus: No maxillary sinus tenderness or frontal sinus tenderness.     Mouth/Throat:     Lips: Pink.     Mouth: Mucous membranes are moist.     Pharynx: Uvula midline. Postnasal drip present. No pharyngeal swelling or posterior oropharyngeal erythema.     Comments: Cobblestoning present to posterior oropharynx  Eyes:     Extraocular Movements: Extraocular movements intact.      Conjunctiva/sclera: Conjunctivae normal.     Pupils: Pupils are equal, round, and reactive to light.  Cardiovascular:     Rate and Rhythm: Normal rate and regular rhythm.     Pulses: Normal pulses.     Heart sounds: Normal heart sounds.  Pulmonary:     Effort: Pulmonary  effort is normal. No respiratory distress.     Breath sounds: Normal breath sounds. No stridor. No wheezing, rhonchi or rales.  Abdominal:     General: Bowel sounds are normal.     Palpations: Abdomen is soft.     Tenderness: There is no abdominal tenderness.  Musculoskeletal:     Cervical back: Normal range of motion.  Lymphadenopathy:     Cervical: No cervical adenopathy.  Skin:    General: Skin is warm and dry.  Neurological:     General: No focal deficit present.     Mental Status: He is alert and oriented to person, place, and time.  Psychiatric:        Mood and Affect: Mood normal.        Behavior: Behavior normal.      UC Treatments / Results  Labs (all labs ordered are listed, but only abnormal results are displayed) Labs Reviewed - No data to display  EKG   Radiology No results found.  Procedures Procedures (including critical care time)  Medications Ordered in UC Medications - No data to display  Initial Impression / Assessment and Plan / UC Course  I have reviewed the triage vital signs and the nursing notes.  Pertinent labs & imaging results that were available during my care of the patient were reviewed by me and considered in my medical decision making (see chart for details).  Patient presents for follow-up for continued upper respiratory symptoms.  Symptoms have been present for approximately 12 days with no improvement despite treatment.  Will treat patient for bacterial upper respiratory infection with doxycycline 100 mg twice daily, Bromfed-DM for cough and nasal congestion, and prednisone 20 mg for asthma and bronchial inflammation.  Supportive care recommendations were provided  and discussed with the patient to include fluids, rest, normal saline nasal spray, and use of a humidifier.  Discussed indications with the patient of when follow-up be necessary.  Patient was in agreement with this plan of care and verbalized understanding.  All questions were answered.  Patient stable for discharge.  Final Clinical Impressions(s) / UC Diagnoses   Final diagnoses:  Acute upper respiratory infection  Cough, unspecified type  History of asthma     Discharge Instructions      Take medication as prescribed. Increase fluids and allow for plenty of rest. May take over-the-counter Tylenol or ibuprofen as needed for pain, fever, general discomfort. Continue warm salt water gargles 3-4 times daily as needed for throat pain or discomfort. Apply warm compresses to the ears as needed for pain or discomfort. Continue using Flonase as directed. Also continue use of normal saline nasal spray throughout the day for nasal congestion and runny nose. For your cough, continue to use a humidifier in the bedroom at nighttime during sleep and sleeping elevated on pillows while symptoms persist. If symptoms do not improve with this treatment, follow-up with your primary care physician for further evaluation.  Follow-up as needed.     ED Prescriptions     Medication Sig Dispense Auth. Provider   doxycycline (VIBRA-TABS) 100 MG tablet Take 1 tablet (100 mg total) by mouth 2 (two) times daily for 7 days. 14 tablet Leath-Warren, Sadie Haber, NP   brompheniramine-pseudoephedrine-DM 30-2-10 MG/5ML syrup Take 5 mLs by mouth 4 (four) times daily as needed. 140 mL Leath-Warren, Sadie Haber, NP   predniSONE (DELTASONE) 20 MG tablet Take 2 tablets (40 mg total) by mouth daily with breakfast for 5 days. 10 tablet Leath-Warren, Sadie Haber,  NP      PDMP not reviewed this encounter.   Abran Cantor, NP 02/25/23 [phone]

## 2023-02-25 NOTE — Discharge Instructions (Signed)
Take medication as prescribed. Increase fluids and allow for plenty of rest. May take over-the-counter Tylenol or ibuprofen as needed for pain, fever, general discomfort. Continue warm salt water gargles 3-4 times daily as needed for throat pain or discomfort. Apply warm compresses to the ears as needed for pain or discomfort. Continue using Flonase as directed. Also continue use of normal saline nasal spray throughout the day for nasal congestion and runny nose. For your cough, continue to use a humidifier in the bedroom at nighttime during sleep and sleeping elevated on pillows while symptoms persist. If symptoms do not improve with this treatment, follow-up with your primary care physician for further evaluation.  Follow-up as needed.

## 2023-03-14 ENCOUNTER — Ambulatory Visit
Admission: EM | Admit: 2023-03-14 | Discharge: 2023-03-14 | Disposition: A | Discharge status: Home / Self Care | Payer: Self-pay | Attending: Nurse Practitioner | Admitting: Nurse Practitioner

## 2023-03-14 DIAGNOSIS — M79645 Pain in left finger(s): Secondary | ICD-10-CM | POA: Diagnosis not present

## 2023-03-14 NOTE — ED Provider Notes (Signed)
[facility]    CSN: [id number] Arrival date & time: 03/14/23  1654      History   Chief Complaint Chief Complaint  Patient presents with   Finger Injury    HPI Princeamir Asad is a 42 y.o. male.   Patient presents today for left pinky pain that began 3 days ago while he was playing bass ball.  Reports he thought he may have jammed his finger.  Reports the area is swollen, was bruised, and is not painful.  It hurts when he tries to move the finger or when he touches it in a certain area.  No numbness or tingling in the tip of the digit.  Has been taking Tylenol and applying ice to the area which helps temporarily.    Past Medical History:  Diagnosis Date   Allergy    Anal fissure    Asthma    Attention deficit 11/25/2017   Bilateral inguinal hernia (BIH) 05/21/2019   Calcaneal spur of foot, right 06/17/2022   Chicken pox    GERD (gastroesophageal reflux disease)    HTN (hypertension)    Mild anxiety    Prediabetes 10/03/2017   Retinal hemorrhage 09/02/2017   Tick bite 12/14/2018    Patient Active Problem List   Diagnosis Date Noted   GERD (gastroesophageal reflux disease) 10/20/2021   Plantar fasciitis, chronic 08/27/2020   Allergic rhinitis 07/09/2019   Mild intermittent asthma 04/30/2019   Eustachian tube dysfunction, left 04/30/2019   Ophthalmologic abnormality 09/12/2017   Morbid obesity with BMI of 50.0-59.9, adult (HCC) 09/12/2017   Essential hypertension 09/12/2017    Past Surgical History:  Procedure Laterality Date   HERNIA REPAIR     PLANTAR FASCIA RELEASE     SPHINCTEROTOMY  2020       Home Medications    Prior to Admission medications   Medication Sig Start Date End Date Taking? Authorizing Provider  albuterol (VENTOLIN HFA) 108 (90 Base) MCG/ACT inhaler INHALE 2 PUFFS BY MOUTH EVERY 4 HOURS AS NEEDED FOR WHEEZING OR SHORTNESS OF BREATH 09/27/22  Yes Kuneff, Renee A, DO  irbesartan (AVAPRO) 300 MG tablet Take 1 tablet (300 mg  total) by mouth daily. 09/27/22  Yes Kuneff, Renee A, DO  omeprazole (PRILOSEC) 20 MG capsule Take 1 capsule (20 mg total) by mouth daily. 09/27/22  Yes Kuneff, Renee A, DO  azelastine (ASTELIN) 0.1 % nasal spray Place 1 spray into both nostrils 2 (two) times daily. Use in each nostril as directed 06/02/22   Margaretann Loveless, PA-C  brompheniramine-pseudoephedrine-DM 30-2-10 MG/5ML syrup Take 5 mLs by mouth 4 (four) times daily as needed. 02/25/23   Leath-Warren, Sadie Haber, NP  cetirizine-pseudoephedrine (ZYRTEC-D) 5-120 MG tablet Take 1 tablet by mouth daily. 02/13/23   Leath-Warren, Sadie Haber, NP  fluticasone (FLONASE) 50 MCG/ACT nasal spray USE 2 SPRAY IN EACH NOSTRIL DAILY 09/27/22   Kuneff, Renee A, DO  promethazine-dextromethorphan (PROMETHAZINE-DM) 6.25-15 MG/5ML syrup Take 5 mLs by mouth 4 (four) times daily as needed. 02/13/23   Leath-Warren, Sadie Haber, NP    Family History Family History  Problem Relation Age of Onset   Diabetes Father    Heart disease Father    Hyperlipidemia Father    Hypertension Father    Drug abuse Sister    Alcohol abuse Sister    Arthritis Paternal Grandmother    Depression Paternal Grandmother    Diabetes Paternal Grandmother    Hyperlipidemia Paternal Grandmother    Heart disease Paternal Grandmother  Hypertension Paternal Grandmother    Arthritis Paternal Grandfather    Hearing loss Paternal Grandfather    Heart disease Paternal Grandfather    Hyperlipidemia Paternal Grandfather    Hypertension Paternal Grandfather    Kidney disease Paternal Grandfather    Drug abuse Brother     Social History Social History   Tobacco Use   Smoking status: Never   Smokeless tobacco: Never  Vaping Use   Vaping status: Never Used  Substance Use Topics   Alcohol use: Never   Drug use: Never     Allergies   Penicillins   Review of Systems Review of Systems Per HPI  Physical Exam Triage Vital Signs ED Triage Vitals  Encounter Vitals Group      BP 03/14/23 1744 (!) 125/91     Systolic BP Percentile --      Diastolic BP Percentile --      Pulse Rate 03/14/23 1744 84     Resp 03/14/23 1744 18     Temp 03/14/23 1744 98.2 F (36.8 C)     Temp Source 03/14/23 1744 Oral     SpO2 03/14/23 1744 96 %     Weight --      Height --      Head Circumference --      Peak Flow --      Pain Score 03/14/23 1745 3     Pain Loc --      Pain Education --      Exclude from Growth Chart --    No data found.  Updated Vital Signs BP (!) 125/91 (BP Location: Right Arm)   Pulse 84   Temp 98.2 F (36.8 C) (Oral)   Resp 18   SpO2 96%   Visual Acuity Right Eye Distance:   Left Eye Distance:   Bilateral Distance:    Right Eye Near:   Left Eye Near:    Bilateral Near:     Physical Exam Vitals and nursing note reviewed.  Constitutional:      General: He is not in acute distress.    Appearance: Normal appearance. He is not toxic-appearing.  Pulmonary:     Effort: Pulmonary effort is normal. No respiratory distress.  Musculoskeletal:     Comments: Inspection: Mild swelling noted to left fifth PIP, bruising, no obvious deformity, bruising or redness Palpation: tender to palpation left fifth PIP; no obvious deformities palpated ROM: Difficult to assess secondary to pain; decreased flexion of left fifth digit Strength: Difficult to assess secondary to pain Neurovascular: neurovascularly intact in distal left fifth digit   Skin:    General: Skin is warm and dry.     Capillary Refill: Capillary refill takes less than 2 seconds.     Coloration: Skin is not jaundiced or pale.     Findings: No erythema.  Neurological:     Mental Status: He is alert and oriented to person, place, and time.  Psychiatric:        Behavior: Behavior is cooperative.      UC Treatments / Results  Labs (all labs ordered are listed, but only abnormal results are displayed) Labs Reviewed - No data to display  EKG   Radiology No results  found.  Procedures Procedures (including critical care time)  Medications Ordered in UC Medications - No data to display  Initial Impression / Assessment and Plan / UC Course  I have reviewed the triage vital signs and the nursing notes.  Pertinent labs & imaging results that  were available during my care of the patient were reviewed by me and considered in my medical decision making (see chart for details).   Patient is well-appearing, normotensive, afebrile, not tachycardic, not tachypneic, oxygenating well on room air.    1. Finger pain, left Given lack of availability for x-ray imaging today in urgent care, will have patient obtain outpatient imaging tomorrow of left pinky In meantime, wear finger splint, continue elevation, ice, Tylenol for pain Recommended follow-up with orthopedics if pain does not gradually improve with splint and contact information provided  The patient was given the opportunity to ask questions.  All questions answered to their satisfaction.  The patient is in agreement to this plan.    Final Clinical Impressions(s) / UC Diagnoses   Final diagnoses:  Finger pain, left     Discharge Instructions      Please wear the finger splint for the next couple of weeks to help protect the bones your pinky.  Tomorrow, please go to [facility] to get an x-ray of your pinky.  This will confirm if there is a broken bone and I will contact you once we have the results.  In the meantime, recommend rest, ice, elevation, Tylenol as needed for pain.  If symptoms do not improve with treatment, follow-up with orthopedics.    ED Prescriptions   None    PDMP not reviewed this encounter.   Valentino Nose, NP 03/14/23 [phone]

## 2023-03-14 NOTE — ED Triage Notes (Signed)
Pt states he was playing basketball Friday and hit his left pinky finger on the ball and now having swelling and pain when trying to move finger.

## 2023-03-14 NOTE — Discharge Instructions (Signed)
Please wear the finger splint for the next couple of weeks to help protect the bones your pinky.  Tomorrow, please go to [facility] to get an x-ray of your pinky.  This will confirm if there is a broken bone and I will contact you once we have the results.  In the meantime, recommend rest, ice, elevation, Tylenol as needed for pain.  If symptoms do not improve with treatment, follow-up with orthopedics.

## 2023-03-15 ENCOUNTER — Ambulatory Visit (HOSPITAL_COMMUNITY)
Admission: RE | Admit: 2023-03-15 | Discharge: 2023-03-15 | Disposition: A | Discharge status: Home / Self Care | Payer: BC Managed Care – PPO | Source: Ambulatory Visit | Attending: Nurse Practitioner | Admitting: Nurse Practitioner

## 2023-03-15 DIAGNOSIS — R936 Abnormal findings on diagnostic imaging of limbs: Secondary | ICD-10-CM | POA: Diagnosis not present

## 2023-03-15 DIAGNOSIS — M79645 Pain in left finger(s): Secondary | ICD-10-CM | POA: Diagnosis not present

## 2023-03-15 DIAGNOSIS — M25642 Stiffness of left hand, not elsewhere classified: Secondary | ICD-10-CM | POA: Insufficient documentation

## 2023-03-15 DIAGNOSIS — M7989 Other specified soft tissue disorders: Secondary | ICD-10-CM | POA: Diagnosis not present

## 2023-04-13 DIAGNOSIS — Z5181 Encounter for therapeutic drug level monitoring: Secondary | ICD-10-CM | POA: Insufficient documentation

## 2023-04-13 NOTE — Patient Instructions (Signed)
Return in about 24 weeks (around 09/29/2023) for Routine chronic condition follow-up. 3 months virtual visit also for ADHD       Great to see you today.  I have refilled the medication(s) we provide.   If labs were collected or images ordered, we will inform you of  results once we have received them and reviewed. We will contact you either by echart message, or telephone call.  Please give ample time to the testing facility, and our office to run,  receive and review results. Please do not call inquiring of results, even if you can see them in your chart. We will contact you as soon as we are able. If it has been over 1 week since the test was completed, and you have not yet heard from [place], then please call us.    - echart message- for normal results that have been seen by the patient already.   - telephone call: abnormal results or if patient has not viewed results in their echart.  If a referral to a specialist was entered for you, please call us in 2 weeks if you have not heard from the specialist office to schedule.

## 2023-04-13 NOTE — Progress Notes (Unsigned)
Patient ID: Nicholas Anthony, male  DOB: [birth year], 43 y.o.   MRN: [id number] Patient Care Team    Relationship Specialty Notifications Start End  Natalia Leatherwood, DO PCP - General Family Medicine  09/12/17   Clark-Burning, Victorino Dike, PA-C (Inactive)  Dermatology  10/17/19     No chief complaint on file.   Subjective:  Nicholas Anthony is a 43 y.o. male present for CPE and chronic Conditions/illness Management All past medical history, surgical history, allergies, family history, immunizations, medications and social history were updated in the electronic medical record today. All recent labs, ED visits and hospitalizations within the last year were reviewed.  Health maintenance:  Colonoscopy: No fhx. Routine screen 46 Immunizations: tdap UTD 2016, Influenza***encouraged yearly).   Infectious disease screening: HIV declined.  Hep c completed PSA: no fhx. Routine screen.No results found for: "PSA", pt was counseled on prostate cancer screenings.  Patient has a Dental home. Hospitalizations/ED visits: Reviewed   Hypertension/morbid obesity/ophthalmological abnormality:  Patient reports compliance with   irbesartan 300 mg qd.  Patient denies chest pain, shortness of breath, dizziness or lower extremity edema.  Normal He has gained 22 lbs since January.  Pt does not take a  daily baby ASA. Pt is is not  prescribed statin. Diet: low sodium Exercise: encouraged routinely.  RF: HTN, obesity, Fhx heart disease  GERD: Symptoms controlled with omeprazole 20 mg daily.    09/27/2022    8:30 AM 10/20/2021    1:08 PM 01/02/2021    8:01 AM 07/25/2020    1:40 PM 02/06/2020    3:36 PM  Depression screen PHQ 2/9  Decreased Interest 0 0 0 0 0  Down, Depressed, Hopeless 0 0 0 0 0  PHQ - 2 Score 0 0 0 0 0  Altered sleeping 0   0   Tired, decreased energy 0   3   Change in appetite 0   3   Feeling bad or failure about yourself  0   0   Trouble concentrating 0   0   Moving slowly or  fidgety/restless 0   0   Suicidal thoughts 0   0   PHQ-9 Score 0   6   Difficult doing work/chores Not difficult at all          09/27/2022    8:30 AM 07/25/2020    1:40 PM 07/24/2019    3:02 PM 12/14/2018    8:12 AM  GAD 7 : Generalized Anxiety Score  Nervous, Anxious, on Edge 0 0 1 0  Control/stop worrying 0 0 0 0  Worry too much - different things 0 0 1 0  Trouble relaxing 0 0 3 0  Restless 0 0 3 0  Easily annoyed or irritable 0 0 0 0  Afraid - awful might happen 0 0 0 0  Total GAD 7 Score 0 0 8 0  Anxiety Difficulty Not difficult at all  Somewhat difficult Not difficult at all           09/27/2022    8:29 AM 09/12/2017    9:37 AM  Fall Risk   Falls in the past year? 0 No  Injury with Fall? 0   Risk for fall due to : No Fall Risks   Follow up Falls evaluation completed     Immunization History  Administered Date(s) Administered   DTaP 09/01/1981, 11/03/1981, 12/29/1981, 07/19/1984, 07/17/1986   IPV 09/01/1981, 11/03/1981, 03/02/1982, 07/17/1986   Influenza Split 03/30/2014  Influenza,inj,Quad PF,6+ Mos 11/25/2017, 12/14/2018, 01/02/2021, 04/12/2022   MMR 10/12/1982   PPD Test 10/20/2021   Tdap 03/30/2014    Past Medical History:  Diagnosis Date   Allergy    Anal fissure    Asthma    Attention deficit 11/25/2017   Bilateral inguinal hernia (BIH) 05/21/2019   Calcaneal spur of foot, right 06/17/2022   Chicken pox    GERD (gastroesophageal reflux disease)    HTN (hypertension)    Mild anxiety    Prediabetes 10/03/2017   Retinal hemorrhage 09/02/2017   Tick bite 12/14/2018   Allergies  Allergen Reactions   Penicillins Rash    Rash only-as a child    Past Surgical History:  Procedure Laterality Date   HERNIA REPAIR     PLANTAR FASCIA RELEASE     SPHINCTEROTOMY  2020   Family History  Problem Relation Age of Onset   Diabetes Father    Heart disease Father    Hyperlipidemia Father    Hypertension Father    Drug abuse Sister    Alcohol abuse Sister     Arthritis Paternal Grandmother    Depression Paternal Grandmother    Diabetes Paternal Grandmother    Hyperlipidemia Paternal Grandmother    Heart disease Paternal Grandmother    Hypertension Paternal Grandmother    Arthritis Paternal Grandfather    Hearing loss Paternal Grandfather    Heart disease Paternal Grandfather    Hyperlipidemia Paternal Grandfather    Hypertension Paternal Grandfather    Kidney disease Paternal Grandfather    Drug abuse Brother    Social History   Social History Narrative   Married, employed,    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    Drinks caffeine.    Smoke alarm in the home. Wears his seatbelt.    Feels safe in his relationship.     Allergies as of 04/14/2023       Reactions   Penicillins Rash   Rash only-as a child        Medication List        Accurate as of April 13, 2023  3:01 PM. If you have any questions, ask your nurse or doctor.          STOP taking these medications    azelastine 0.1 % nasal spray Commonly known as: ASTELIN   brompheniramine-pseudoephedrine-DM 30-2-10 MG/5ML syrup   promethazine-dextromethorphan 6.25-15 MG/5ML syrup Commonly known as: PROMETHAZINE-DM       TAKE these medications    albuterol 108 (90 Base) MCG/ACT inhaler Commonly known as: VENTOLIN HFA INHALE 2 PUFFS BY MOUTH EVERY 4 HOURS AS NEEDED FOR WHEEZING OR SHORTNESS OF BREATH   cetirizine-pseudoephedrine 5-120 MG tablet Commonly known as: ZYRTEC-D Take 1 tablet by mouth daily.   fluticasone 50 MCG/ACT nasal spray Commonly known as: FLONASE USE 2 SPRAY IN EACH NOSTRIL DAILY   irbesartan 300 MG tablet Commonly known as: Avapro Take 1 tablet (300 mg total) by mouth daily.   omeprazole 20 MG capsule Commonly known as: PRILOSEC Take 1 capsule (20 mg total) by mouth daily.       All past medical history, surgical history, allergies, family history, immunizations andmedications were updated in the EMR today  and reviewed under the history and medication portions of their EMR.     Recent Results (from the past 2160 hours)  POCT rapid strep A     Status: None   Collection Time: 02/13/23  2:07 PM  Result Value Ref Range  Rapid Strep A Screen Negative Negative     No results found.  ROS: 14 pt review of systems performed and negative (unless mentioned in an HPI)  Objective: There were no vitals taken for this visit. Physical Exam Vitals and nursing note reviewed.  Constitutional:      General: He is not in acute distress.    Appearance: Normal appearance. He is obese. He is not ill-appearing, toxic-appearing or diaphoretic.  HENT:     Head: Normocephalic and atraumatic.     Right Ear: Tympanic membrane, ear canal and external ear normal. There is no impacted cerumen.     Left Ear: Tympanic membrane, ear canal and external ear normal. There is no impacted cerumen.     Nose: Nose normal. No congestion or rhinorrhea.     Mouth/Throat:     Mouth: Mucous membranes are moist.     Pharynx: Oropharynx is clear. No oropharyngeal exudate or posterior oropharyngeal erythema.  Eyes:     General: No scleral icterus.       Right eye: No discharge.        Left eye: No discharge.     Extraocular Movements: Extraocular movements intact.     Pupils: Pupils are equal, round, and reactive to light.  Cardiovascular:     Rate and Rhythm: Normal rate and regular rhythm.     Pulses: Normal pulses.     Heart sounds: Normal heart sounds. No murmur heard.    No friction rub. No gallop.  Pulmonary:     Effort: Pulmonary effort is normal. No respiratory distress.     Breath sounds: Normal breath sounds. No stridor. No wheezing, rhonchi or rales.  Chest:     Chest wall: No tenderness.  Abdominal:     General: Abdomen is flat. Bowel sounds are normal. There is no distension.     Palpations: Abdomen is soft. There is no mass.     Tenderness: There is no abdominal tenderness. There is no right CVA  tenderness, left CVA tenderness, guarding or rebound.     Hernia: No hernia is present.  Musculoskeletal:        General: No swelling or tenderness. Normal range of motion.     Cervical back: Normal range of motion and neck supple.     Right lower leg: No edema.     Left lower leg: No edema.  Lymphadenopathy:     Cervical: No cervical adenopathy.  Skin:    General: Skin is warm and dry.     Coloration: Skin is not jaundiced.     Findings: No bruising, lesion or rash.  Neurological:     General: No focal deficit present.     Mental Status: He is alert and oriented to person, place, and time. Mental status is at baseline.     Cranial Nerves: No cranial nerve deficit.     Sensory: No sensory deficit.     Motor: No weakness.     Coordination: Coordination normal.     Gait: Gait normal.     Deep Tendon Reflexes: Reflexes normal.  Psychiatric:        Mood and Affect: Mood normal.        Behavior: Behavior normal.        Thought Content: Thought content normal.        Judgment: Judgment normal.    No results found.  Assessment/plan: Nicholas Anthony is a 43 y.o. male present for Chronic Conditions/illness Management Hypertension/obesity:  Table Continue Avapro to  300 mg qd - Continue diet and weight loss regimen, routine exercise.  -Low sodium diet. Lipids collected today  Gastroesophageal reflux disease without esophagitis/long-term proton pump use Stable  continue protonix B12, vitamin D and magnesium levels collected today *** Patient was encouraged to exercise greater than 150 minutes a week. Patient was encouraged to choose a diet filled with fresh fruits and vegetables, and lean meats. AVS provided to patient today for education/recommendation on gender specific health and safety maintenance. *** No follow-ups on file.  No orders of the defined types were placed in this encounter.  No orders of the defined types were placed in this encounter.  Referral Orders  No  referral(s) requested today     Note is dictated utilizing voice recognition software. Although note has been proof read prior to signing, occasional typographical errors still can be missed. If any questions arise, please do not hesitate to call for verification.  Electronically signed by: Felix Pacini, DO [facility] [place]

## 2023-04-14 ENCOUNTER — Ambulatory Visit: Payer: Self-pay | Admitting: Family Medicine

## 2023-04-14 ENCOUNTER — Encounter: Payer: Self-pay | Admitting: Family Medicine

## 2023-04-14 VITALS — BP 134/88 | HR 73 | Temp 97.6°F | Wt >= 6400 oz

## 2023-04-14 DIAGNOSIS — I10 Essential (primary) hypertension: Secondary | ICD-10-CM

## 2023-04-14 DIAGNOSIS — Z5181 Encounter for therapeutic drug level monitoring: Secondary | ICD-10-CM

## 2023-04-14 DIAGNOSIS — Z0000 Encounter for general adult medical examination without abnormal findings: Secondary | ICD-10-CM | POA: Diagnosis not present

## 2023-04-14 DIAGNOSIS — K219 Gastro-esophageal reflux disease without esophagitis: Secondary | ICD-10-CM | POA: Diagnosis not present

## 2023-04-14 DIAGNOSIS — Z79899 Other long term (current) drug therapy: Secondary | ICD-10-CM

## 2023-04-14 DIAGNOSIS — J4520 Mild intermittent asthma, uncomplicated: Secondary | ICD-10-CM

## 2023-04-14 DIAGNOSIS — Z131 Encounter for screening for diabetes mellitus: Secondary | ICD-10-CM | POA: Diagnosis not present

## 2023-04-14 DIAGNOSIS — Z6841 Body Mass Index (BMI) 40.0 and over, adult: Secondary | ICD-10-CM | POA: Diagnosis not present

## 2023-04-14 DIAGNOSIS — Z23 Encounter for immunization: Secondary | ICD-10-CM | POA: Diagnosis not present

## 2023-04-14 DIAGNOSIS — F902 Attention-deficit hyperactivity disorder, combined type: Secondary | ICD-10-CM

## 2023-04-14 DIAGNOSIS — F909 Attention-deficit hyperactivity disorder, unspecified type: Secondary | ICD-10-CM | POA: Insufficient documentation

## 2023-04-14 LAB — COMPREHENSIVE METABOLIC PANEL
ALT: 83 U/L — ABNORMAL HIGH (ref 0–53)
AST: 41 U/L — ABNORMAL HIGH (ref 0–37)
Albumin: 4.6 g/dL (ref 3.5–5.2)
Alkaline Phosphatase: 55 U/L (ref 39–117)
BUN: 18 mg/dL (ref 6–23)
CO2: 29 meq/L (ref 19–32)
Calcium: 9.3 mg/dL (ref 8.4–10.5)
Chloride: 104 meq/L (ref 96–112)
Creatinine, Ser: 0.88 mg/dL (ref 0.40–1.50)
GFR: 106.55 mL/min (ref >60.00)
Glucose, Bld: 98 mg/dL (ref 70–99)
Potassium: 4.7 meq/L (ref 3.5–5.1)
Sodium: 141 meq/L (ref 135–145)
Total Bilirubin: 0.4 mg/dL (ref 0.2–1.2)
Total Protein: 7.2 g/dL (ref 6.0–8.3)

## 2023-04-14 LAB — LIPID PANEL
Cholesterol: 178 mg/dL (ref 0–200)
HDL: 34.2 mg/dL — ABNORMAL LOW (ref >39.00)
LDL Cholesterol: 123 mg/dL — ABNORMAL HIGH (ref 0–99)
NonHDL: 143.9
Total CHOL/HDL Ratio: 5
Triglycerides: 103 mg/dL (ref 0.0–149.0)
VLDL: 20.6 mg/dL (ref 0.0–40.0)

## 2023-04-14 LAB — CBC
HCT: 49.6 % (ref 39.0–52.0)
Hemoglobin: 16.2 g/dL (ref 13.0–17.0)
MCHC: 32.7 g/dL (ref 30.0–36.0)
MCV: 84.3 fL (ref 78.0–100.0)
Platelets: 319 10*3/uL (ref 150.0–400.0)
RBC: 5.88 Mil/uL — ABNORMAL HIGH (ref 4.22–5.81)
RDW: 15.1 % (ref 11.5–15.5)
WBC: 8.3 10*3/uL (ref 4.0–10.5)

## 2023-04-14 LAB — VITAMIN D 25 HYDROXY (VIT D DEFICIENCY, FRACTURES): VITD: 20.21 ng/mL — ABNORMAL LOW (ref 30.00–100.00)

## 2023-04-14 LAB — TSH: TSH: 1.66 u[IU]/mL (ref 0.35–5.50)

## 2023-04-14 LAB — B12 AND FOLATE PANEL
Folate: 16.4 ng/mL (ref >5.9)
Vitamin B-12: 371 pg/mL (ref 211–911)

## 2023-04-14 LAB — MAGNESIUM: Magnesium: 2.2 mg/dL (ref 1.5–2.5)

## 2023-04-14 LAB — HEMOGLOBIN A1C: Hgb A1c MFr Bld: 6.5 % (ref 4.6–6.5)

## 2023-04-14 MED ORDER — OMEPRAZOLE 20 MG PO CPDR: 20.0000 mg | DELAYED_RELEASE_CAPSULE | Freq: Every day | ORAL | 5 refills | Status: DC

## 2023-04-14 MED ORDER — IRBESARTAN 300 MG PO TABS: 300.0000 mg | ORAL_TABLET | Freq: Every day | ORAL | 6 refills | Status: DC

## 2023-04-14 MED ORDER — FLUTICASONE PROPIONATE 50 MCG/ACT NA SUSP: NASAL | 5 refills | Status: AC

## 2023-04-14 MED ORDER — AMPHETAMINE-DEXTROAMPHETAMINE 20 MG PO TABS: 10.0000 mg | ORAL_TABLET | Freq: Two times a day (BID) | ORAL | 0 refills | Status: DC

## 2023-04-14 MED ORDER — ALBUTEROL SULFATE HFA 108 (90 BASE) MCG/ACT IN AERS: INHALATION_SPRAY | RESPIRATORY_TRACT | 5 refills | Status: DC

## 2023-04-15 ENCOUNTER — Telehealth: Payer: Self-pay | Admitting: Family Medicine

## 2023-04-15 NOTE — Telephone Encounter (Signed)
Please call patient Kidney and thyroid functions are normal. Blood cell counts and electrolytes are normal. Folate levels are normal. Vitamin D levels are low at 20 would encourage you to start vitamin D 2000 units daily. B12 levels are low normal at 371-would encourage you to take a B complex supplement. Liver enzymes are still mildly elevated, this is from fatty infiltrate of the liver.  Following a low glycemic diabetic diet can help with fatty liver  His A1c is higher than prior with an A1c of 6.5, his glucose however was normal.  An A1c greater than 6.5 is considered diabetic, as long as the glucose is also elevated which is is not.  This means he is borderline diabetic at this time.  -Recommend obtaining routine exercise.  Increase the fiber content in the diet decrease the complex carbohydrates, which is flour, sugar, pasta, potatoes, rice.  Carbohydrate sources should come from vegetables or fruits when at all possible.  Diabetes next chronic condition follow-up we will repeat the A1c to ensure he is returning back to normal.  We did fit increases, then we would need to discuss diabetic medication.

## 2023-04-15 NOTE — Telephone Encounter (Signed)
Spoke with patient regarding results/recommendations.

## 2023-04-15 NOTE — Telephone Encounter (Signed)
LM for pt to return call to discuss.

## 2023-06-13 ENCOUNTER — Other Ambulatory Visit: Payer: Self-pay | Admitting: Family Medicine

## 2023-06-15 NOTE — Telephone Encounter (Signed)
 Last refill sent 06/02/23  Please decline

## 2023-06-27 ENCOUNTER — Telehealth: Payer: BC Managed Care – PPO | Admitting: Family Medicine

## 2023-06-27 ENCOUNTER — Encounter: Payer: Self-pay | Admitting: Family Medicine

## 2023-06-27 DIAGNOSIS — F902 Attention-deficit hyperactivity disorder, combined type: Secondary | ICD-10-CM

## 2023-06-27 MED ORDER — AMPHETAMINE-DEXTROAMPHETAMINE 20 MG PO TABS: 10.0000 mg | ORAL_TABLET | Freq: Two times a day (BID) | ORAL | 0 refills | Status: DC

## 2023-06-27 NOTE — Patient Instructions (Signed)

## 2023-06-27 NOTE — Progress Notes (Signed)
 VIRTUAL VISIT VIA VIDEO  I connected with [name] on 06/27/23 at  8:40 AM EDT by a video enabled telemedicine application and verified that I am speaking with the correct person using two identifiers. Location patient: Home Location provider: [facility], Office Persons participating in the virtual visit: Patient, Dr. Claiborne Billings and Ivonne Andrew, CMA  I discussed the limitations of evaluation and management by telemedicine and the availability of in person appointments. The patient expressed understanding and agreed to proceed.     Patient ID: Nicholas Anthony, male  DOB: December 07, 1980, 43 y.o.   MRN: [id number] Patient Care Team    Relationship Specialty Notifications Start End  Natalia Leatherwood, DO PCP - General Family Medicine  09/12/17   Clark-Burning, Gertie Baron (Inactive)  Dermatology  10/17/19     Chief Complaint  Patient presents with   ADHD    Subjective:  Nicholas Anthony is a 43 y.o. male present for chronic Conditions/illness Management All past medical history, surgical history, allergies, family history, immunizations, medications and social history were updated in the electronic medical record today. All recent labs, ED visits and hospitalizations within the last year were reviewed.   ADHD: Patient reports compliance with Adderall 10 mg twice daily, he feels this medication is working well and would like to continue.  He has not experienced any negative side effects.  It is helping focus.  Prior note: Patient has started back into classes and has noticed his attention/focus abilities are lacking, especially when reading again.  He would like to get back on his ADHD medication that he had discontinued himself back in March or 2023.  At that time he was prescribed Adderall 10 mg twice daily and feels it helped a great deal for him.     04/14/2023    8:45 AM 09/27/2022    8:30 AM 10/20/2021    1:08 PM 01/02/2021    8:01 AM 07/25/2020    1:40 PM  Depression screen  PHQ 2/9  Decreased Interest 0 0 0 0 0  Down, Depressed, Hopeless 0 0 0 0 0  PHQ - 2 Score 0 0 0 0 0  Altered sleeping  0   0  Tired, decreased energy  0   3  Change in appetite  0   3  Feeling bad or failure about yourself   0   0  Trouble concentrating  0   0  Moving slowly or fidgety/restless  0   0  Suicidal thoughts  0   0  PHQ-9 Score  0   6  Difficult doing work/chores  Not difficult at all         09/27/2022    8:30 AM 07/25/2020    1:40 PM 07/24/2019    3:02 PM 12/14/2018    8:12 AM  GAD 7 : Generalized Anxiety Score  Nervous, Anxious, on Edge 0 0 1 0  Control/stop worrying 0 0 0 0  Worry too much - different things 0 0 1 0  Trouble relaxing 0 0 3 0  Restless 0 0 3 0  Easily annoyed or irritable 0 0 0 0  Afraid - awful might happen 0 0 0 0  Total GAD 7 Score 0 0 8 0  Anxiety Difficulty Not difficult at all  Somewhat difficult Not difficult at all           04/14/2023    8:45 AM 09/27/2022    8:29 AM 09/12/2017  9:37 AM  Fall Risk   Falls in the past year? 0 0 No  Number falls in past yr: 0    Injury with Fall? 0 0   Risk for fall due to : No Fall Risks No Fall Risks   Follow up Falls evaluation completed Falls evaluation completed     Immunization History  Administered Date(s) Administered   DTaP 09/01/1981, 11/03/1981, 12/29/1981, 07/19/1984, 07/17/1986   IPV 09/01/1981, 11/03/1981, 03/02/1982, 07/17/1986   Influenza Split 03/30/2014   Influenza, Seasonal, Injecte, Preservative Fre 04/14/2023   Influenza,inj,Quad PF,6+ Mos 11/25/2017, 12/14/2018, 01/02/2021, 04/12/2022   MMR 10/12/1982   PNEUMOCOCCAL CONJUGATE-20 04/14/2023   PPD Test 10/20/2021   Tdap 03/30/2014    Past Medical History:  Diagnosis Date   Allergy    Anal fissure    Asthma    Attention deficit 11/25/2017   Bilateral inguinal hernia (BIH) 05/21/2019   Calcaneal spur of foot, right 06/17/2022   Chicken pox    GERD (gastroesophageal reflux disease)    HTN (hypertension)    Mild  anxiety    Prediabetes 10/03/2017   Retinal hemorrhage 09/02/2017   Tick bite 12/14/2018   Allergies  Allergen Reactions   Penicillins Rash    Rash only-as a child    Past Surgical History:  Procedure Laterality Date   HERNIA REPAIR     PLANTAR FASCIA RELEASE     SPHINCTEROTOMY  2020   Family History  Problem Relation Age of Onset   Diabetes Father    Heart disease Father    Hyperlipidemia Father    Hypertension Father    Drug abuse Sister    Alcohol abuse Sister    Arthritis Paternal Grandmother    Depression Paternal Grandmother    Diabetes Paternal Grandmother    Hyperlipidemia Paternal Grandmother    Heart disease Paternal Grandmother    Hypertension Paternal Grandmother    Arthritis Paternal Grandfather    Hearing loss Paternal Grandfather    Heart disease Paternal Grandfather    Hyperlipidemia Paternal Grandfather    Hypertension Paternal Grandfather    Kidney disease Paternal Grandfather    Drug abuse Brother    Social History   Social History Narrative   Married, employed,    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    Drinks caffeine.    Smoke alarm in the home. Wears his seatbelt.    Feels safe in his relationship.     Allergies as of 06/27/2023       Reactions   Penicillins Rash   Rash only-as a child        Medication List        Accurate as of June 27, 2023  8:40 AM. If you have any questions, ask your nurse or doctor.          albuterol 108 (90 Base) MCG/ACT inhaler Commonly known as: VENTOLIN HFA INHALE 2 PUFFS BY MOUTH EVERY 4 HOURS AS NEEDED FOR WHEEZING OR SHORTNESS OF BREATH   amphetamine-dextroamphetamine 20 MG tablet Commonly known as: ADDERALL Take 0.5 tablets (10 mg total) by mouth 2 (two) times daily. What changed: Another medication with the same name was changed. Make sure you understand how and when to take each. Changed by: Felix Pacini   amphetamine-dextroamphetamine 20 MG tablet Commonly known as:  ADDERALL Take 0.5 tablets (10 mg total) by mouth 2 (two) times daily. Start taking on: July 20, 2023 What changed: These instructions start on July 20, 2023. If you are unsure  what to do until then, ask your doctor or other care provider. Changed by: Felix Pacini   amphetamine-dextroamphetamine 20 MG tablet Commonly known as: ADDERALL Take 0.5 tablets (10 mg total) by mouth 2 (two) times daily. Start taking on: Aug 12, 2023 What changed: These instructions start on Aug 12, 2023. If you are unsure what to do until then, ask your doctor or other care provider. Changed by: Felix Pacini   fluticasone 50 MCG/ACT nasal spray Commonly known as: FLONASE USE 2 SPRAY IN EACH NOSTRIL DAILY   irbesartan 300 MG tablet Commonly known as: Avapro Take 1 tablet (300 mg total) by mouth daily.   omeprazole 20 MG capsule Commonly known as: PRILOSEC Take 1 capsule (20 mg total) by mouth daily.       All past medical history, surgical history, allergies, family history, immunizations andmedications were updated in the EMR today and reviewed under the history and medication portions of their EMR.       No results found.  ROS: 14 pt review of systems performed and negative (unless mentioned in an HPI)  Objective: There were no vitals taken for this visit. Physical Exam Vitals and nursing note reviewed.  Constitutional:      General: He is not in acute distress.    Appearance: Normal appearance. He is not toxic-appearing.  HENT:     Head: Normocephalic and atraumatic.  Eyes:     General: No scleral icterus.       Right eye: No discharge.        Left eye: No discharge.     Conjunctiva/sclera: Conjunctivae normal.  Pulmonary:     Effort: Pulmonary effort is normal.  Musculoskeletal:     Cervical back: Normal range of motion.  Skin:    Findings: No rash.  Neurological:     Mental Status: He is alert and oriented to person, place, and time. Mental status is at baseline.  Psychiatric:         Mood and Affect: Mood normal.        Behavior: Behavior normal.        Thought Content: Thought content normal.        Judgment: Judgment normal.    No results found.  Assessment/plan: Nicholas Anthony is a 43 y.o. male present for Chronic Conditions/illness Management ADHD: Stable Continue Adderall 10 mg twice daily.  Adderall 20 mg tab half tab p.o. twice daily#30 x3. [place] controlled substance database reviewed today. Follow-up 2 and half to 3 months virtually for [facility]  Return in about 11 weeks (around 09/12/2023) for Routine chronic condition follow-up.  No orders of the defined types were placed in this encounter.  Meds ordered this encounter  Medications   amphetamine-dextroamphetamine (ADDERALL) 20 MG tablet    Sig: Take 0.5 tablets (10 mg total) by mouth 2 (two) times daily.    Dispense:  30 tablet    Refill:  0   amphetamine-dextroamphetamine (ADDERALL) 20 MG tablet    Sig: Take 0.5 tablets (10 mg total) by mouth 2 (two) times daily.    Dispense:  30 tablet    Refill:  0   amphetamine-dextroamphetamine (ADDERALL) 20 MG tablet    Sig: Take 0.5 tablets (10 mg total) by mouth 2 (two) times daily.    Dispense:  30 tablet    Refill:  0   Referral Orders  No referral(s) requested today     Note is dictated utilizing voice recognition software. Although note has been proof read prior  to signing, occasional typographical errors still can be missed. If any questions arise, please do not hesitate to call for verification.  Electronically signed by: Felix Pacini, DO [facility] [place]

## 2023-07-25 ENCOUNTER — Other Ambulatory Visit: Payer: Self-pay | Admitting: Family Medicine

## 2023-07-26 NOTE — Telephone Encounter (Signed)
 Please call pt  1. Adderall 20 mg is a very common dose of pill. I do not not know why his pharmacy would have told him that.   2.we would need him to confirm a pharmacy he would like us  to send the medication instead and we will send his adderall there.

## 2023-07-26 NOTE — Telephone Encounter (Signed)
 Patient Comment: [facility] says they cannot get the 20MG  tablet of Adderall. The pharmacist says nobody prescribes that amount anymore. The pharmacist said they keep 30MG  tablets of Adderall in stock. I have been out of Adderall for over a week and the pharmacist said she did not know if she would be able to get any 20 MG tablets. What can I do to get my prescription filled. Thank you

## 2023-10-03 ENCOUNTER — Ambulatory Visit: Payer: BC Managed Care – PPO | Admitting: Family Medicine

## 2023-10-03 ENCOUNTER — Encounter: Payer: Self-pay | Admitting: Family Medicine

## 2023-10-03 VITALS — BP 128/86 | HR 74 | Temp 98.2°F | Wt >= 6400 oz

## 2023-10-03 DIAGNOSIS — R7989 Other specified abnormal findings of blood chemistry: Secondary | ICD-10-CM | POA: Diagnosis not present

## 2023-10-03 DIAGNOSIS — R7309 Other abnormal glucose: Secondary | ICD-10-CM

## 2023-10-03 DIAGNOSIS — F902 Attention-deficit hyperactivity disorder, combined type: Secondary | ICD-10-CM

## 2023-10-03 DIAGNOSIS — K219 Gastro-esophageal reflux disease without esophagitis: Secondary | ICD-10-CM | POA: Diagnosis not present

## 2023-10-03 DIAGNOSIS — E559 Vitamin D deficiency, unspecified: Secondary | ICD-10-CM | POA: Diagnosis not present

## 2023-10-03 DIAGNOSIS — Z111 Encounter for screening for respiratory tuberculosis: Secondary | ICD-10-CM

## 2023-10-03 DIAGNOSIS — I10 Essential (primary) hypertension: Secondary | ICD-10-CM

## 2023-10-03 DIAGNOSIS — Z6841 Body Mass Index (BMI) 40.0 and over, adult: Secondary | ICD-10-CM

## 2023-10-03 LAB — HEPATIC FUNCTION PANEL
ALT: 90 U/L — ABNORMAL HIGH (ref 0–53)
AST: 51 U/L — ABNORMAL HIGH (ref 0–37)
Albumin: 4.3 g/dL (ref 3.5–5.2)
Alkaline Phosphatase: 59 U/L (ref 39–117)
Bilirubin, Direct: 0.1 mg/dL (ref 0.0–0.3)
Total Bilirubin: 0.5 mg/dL (ref 0.2–1.2)
Total Protein: 6.7 g/dL (ref 6.0–8.3)

## 2023-10-03 LAB — HEMOGLOBIN A1C: Hgb A1c MFr Bld: 6.7 % — ABNORMAL HIGH (ref 4.6–6.5)

## 2023-10-03 LAB — VITAMIN D 25 HYDROXY (VIT D DEFICIENCY, FRACTURES): VITD: 27.6 ng/mL — ABNORMAL LOW (ref 30.00–100.00)

## 2023-10-03 MED ORDER — AMPHETAMINE-DEXTROAMPHETAMINE 20 MG PO TABS: 10.0000 mg | ORAL_TABLET | Freq: Two times a day (BID) | ORAL | 0 refills | Status: DC

## 2023-10-03 MED ORDER — OMEPRAZOLE 20 MG PO CPDR: 20.0000 mg | DELAYED_RELEASE_CAPSULE | Freq: Every day | ORAL | 5 refills | Status: DC

## 2023-10-03 MED ORDER — IRBESARTAN 300 MG PO TABS: 300.0000 mg | ORAL_TABLET | Freq: Every day | ORAL | 6 refills | Status: DC

## 2023-10-03 NOTE — Progress Notes (Signed)
 Patient ID: Nicholas Anthony, male  DOB: 1981/02/18, 43 y.o.   MRN: [id number] Patient Care Team    Relationship Specialty Notifications Start End  Catherine Charlies LABOR, DO PCP - General Family Medicine  09/12/17   Clark-Burning, Delon RIGGERS (Inactive)  Dermatology  10/17/19     Chief Complaint  Patient presents with   Hypertension    Chronic Conditions/illness Management     Subjective:  Cope Anthony is a 43 y.o. male present for chronic Conditions/illness Management All past medical history, surgical history, allergies, family history, immunizations, medications and social history were updated in the electronic medical record today. All recent labs, ED visits and hospitalizations within the last year were reviewed.  Hypertension/morbid obesity/ophthalmological abnormality:  Patient reports compliance with   irbesartan  300mg  qd.  Patient denies chest pain, shortness of breath, dizziness or lower extremity edema.  Pt does not take a  daily baby ASA. Pt is is not  prescribed statin. Diet: low sodium Exercise: encouraged routinely.  RF: HTN, obesity, Fhx heart disease  GERD: Symptoms controlled with omeprazole  20 mg daily.  ADHD: Patient reports compliance with Adderall 10 mg twice daily, he feels this medication is working well and would like to continue.  He has not experienced any negative side effects.  It is helping focus.  Prior note: Patient has started back into classes and has noticed his attention/focus abilities are lacking, especially when reading again.  He would like to get back on his ADHD medication that he had discontinued himself back in March or 2023.  At that time he was prescribed Adderall 10 mg twice daily and feels it helped a great deal for him.     10/03/2023    9:08 AM 04/14/2023    8:45 AM 09/27/2022    8:30 AM 10/20/2021    1:08 PM 01/02/2021    8:01 AM  Depression screen PHQ 2/9  Decreased Interest 0 0 0 0 0  Down, Depressed, Hopeless 0 0 0 0 0  PHQ  - 2 Score 0 0 0 0 0  Altered sleeping 0  0    Tired, decreased energy 0  0    Change in appetite 0  0    Feeling bad or failure about yourself  0  0    Trouble concentrating 0  0    Moving slowly or fidgety/restless 0  0    Suicidal thoughts 0  0    PHQ-9 Score 0  0    Difficult doing work/chores Not difficult at all  Not difficult at all        10/03/2023    9:08 AM 09/27/2022    8:30 AM 07/25/2020    1:40 PM 07/24/2019    3:02 PM  GAD 7 : Generalized Anxiety Score  Nervous, Anxious, on Edge 0 0 0 1  Control/stop worrying 0 0 0 0  Worry too much - different things 0 0 0 1  Trouble relaxing 0 0 0 3  Restless 0 0 0 3  Easily annoyed or irritable 0 0 0 0  Afraid - awful might happen 0 0 0 0  Total GAD 7 Score 0 0 0 8  Anxiety Difficulty Not difficult at all Not difficult at all  Somewhat difficult           10/03/2023    9:08 AM 04/14/2023    8:45 AM 09/27/2022    8:29 AM 09/12/2017    9:37 AM  Fall Risk  Falls in the past year? 0 0 0 No   Number falls in past yr:  0    Injury with Fall?  0 0   Risk for fall due to :  No Fall Risks No Fall Risks   Follow up Falls evaluation completed Falls evaluation completed Falls evaluation completed      Data saved with a previous flowsheet row definition    Immunization History  Administered Date(s) Administered   DTaP 09/01/1981, 11/03/1981, 12/29/1981, 07/19/1984, 07/17/1986   IPV 09/01/1981, 11/03/1981, 03/02/1982, 07/17/1986   Influenza Split 03/30/2014   Influenza, Seasonal, Injecte, Preservative Fre 04/14/2023   Influenza,inj,Quad PF,6+ Mos 11/25/2017, 12/14/2018, 01/02/2021, 04/12/2022   MMR 10/12/1982   PNEUMOCOCCAL CONJUGATE-20 04/14/2023   PPD Test 10/20/2021   Tdap 03/30/2014    Past Medical History:  Diagnosis Date   Allergy    Anal fissure    Asthma    Attention deficit 11/25/2017   Bilateral inguinal hernia (BIH) 05/21/2019   Calcaneal spur of foot, right 06/17/2022   Chicken pox    GERD (gastroesophageal  reflux disease)    HTN (hypertension)    Mild anxiety    Prediabetes 10/03/2017   Retinal hemorrhage 09/02/2017   Tick bite 12/14/2018   Allergies  Allergen Reactions   Penicillins Rash    Rash only-as a child    Past Surgical History:  Procedure Laterality Date   HERNIA REPAIR     PLANTAR FASCIA RELEASE     SPHINCTEROTOMY  2020   Family History  Problem Relation Age of Onset   Diabetes Father    Heart disease Father    Hyperlipidemia Father    Hypertension Father    Drug abuse Sister    Alcohol abuse Sister    Arthritis Paternal Grandmother    Depression Paternal Grandmother    Diabetes Paternal Grandmother    Hyperlipidemia Paternal Grandmother    Heart disease Paternal Grandmother    Hypertension Paternal Grandmother    Arthritis Paternal Grandfather    Hearing loss Paternal Grandfather    Heart disease Paternal Grandfather    Hyperlipidemia Paternal Grandfather    Hypertension Paternal Grandfather    Kidney disease Paternal Grandfather    Drug abuse Brother    Social History   Social History Narrative   Married, employed,    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    Drinks caffeine.    Smoke alarm in the home. Wears his seatbelt.    Feels safe in his relationship.     Allergies as of 10/03/2023       Reactions   Penicillins Rash   Rash only-as a child        Medication List        Accurate as of October 03, 2023  3:33 PM. If you have any questions, ask your nurse or doctor.          albuterol  108 (90 Base) MCG/ACT inhaler Commonly known as: VENTOLIN  HFA INHALE 2 PUFFS BY MOUTH EVERY 4 HOURS AS NEEDED FOR WHEEZING OR SHORTNESS OF BREATH   amphetamine -dextroamphetamine  20 MG tablet Commonly known as: ADDERALL Take 0.5 tablets (10 mg total) by mouth 2 (two) times daily. What changed: Another medication with the same name was changed. Make sure you understand how and when to take each. Changed by: Terisa Belardo    amphetamine -dextroamphetamine  20 MG tablet Commonly known as: ADDERALL Take 0.5 tablets (10 mg total) by mouth 2 (two) times daily. Start taking on: October 28, 2023  What changed: These instructions start on October 28, 2023. If you are unsure what to do until then, ask your doctor or other care provider. Changed by: Dawan Farney   amphetamine -dextroamphetamine  20 MG tablet Commonly known as: ADDERALL Take 0.5 tablets (10 mg total) by mouth 2 (two) times daily. Start taking on: November 18, 2023 What changed: These instructions start on November 18, 2023. If you are unsure what to do until then, ask your doctor or other care provider. Changed by: Marguita Venning   fluticasone  50 MCG/ACT nasal spray Commonly known as: FLONASE  USE 2 SPRAY IN EACH NOSTRIL DAILY   irbesartan  300 MG tablet Commonly known as: Avapro  Take 1 tablet (300 mg total) by mouth daily.   omeprazole  20 MG capsule Commonly known as: PRILOSEC Take 1 capsule (20 mg total) by mouth daily.       All past medical history, surgical history, allergies, family history, immunizations andmedications were updated in the EMR today and reviewed under the history and medication portions of their EMR.       No results found.  ROS: 14 pt review of systems performed and negative (unless mentioned in an HPI)  Objective: BP 128/86   Pulse 74   Temp 98.2 F (36.8 C)   Wt (!) 414 lb (187.8 kg)   SpO2 98%   BMI 52.57 kg/m  Physical Exam Vitals and nursing note reviewed. Exam conducted with a chaperone present.  Constitutional:      General: He is not in acute distress.    Appearance: Normal appearance. He is obese. He is not ill-appearing, toxic-appearing or diaphoretic.  HENT:     Head: Normocephalic and atraumatic.  Eyes:     General: No scleral icterus.       Right eye: No discharge.        Left eye: No discharge.     Extraocular Movements: Extraocular movements intact.     Conjunctiva/sclera: Conjunctivae normal.      Pupils: Pupils are equal, round, and reactive to light.  Cardiovascular:     Rate and Rhythm: Normal rate and regular rhythm.  Pulmonary:     Effort: Pulmonary effort is normal. No respiratory distress.     Breath sounds: Normal breath sounds. No wheezing, rhonchi or rales.  Musculoskeletal:     Cervical back: Normal range of motion.     Right lower leg: No edema.     Left lower leg: No edema.  Skin:    General: Skin is warm.     Findings: No rash.  Neurological:     Mental Status: He is alert and oriented to person, place, and time. Mental status is at baseline.  Psychiatric:        Mood and Affect: Mood normal.        Behavior: Behavior normal.        Thought Content: Thought content normal.        Judgment: Judgment normal.    Assessment/plan: Budd Freiermuth is a 43 y.o. male present for Chronic Conditions/illness Management Hypertension/obesity:  Stable Continue Avapro  to 300 mg qd - Continue diet and weight loss regimen, routine exercise.  -Low sodium diet.   Gastroesophageal reflux disease without esophagitis/long-term proton pump use Stable Continue protonix B12, vitamin D  and magnesium levels UTD 03/2023 with extremely low vitamin D .  Vitamin D  deficiency Patient had been encouraged to start vitamin D  3 2000 units daily OTC-taking Vitamin D  collected today  Elevated hemoglobin A1c Last A1c 6.5. Glycemic diet encouraged. Repeat  A1c today. Consider GLP-1 inhibitor if A1c still greater than 6.5  Elevated LFTs Mildly elevated LFTs, most likely fatty liver disease. LFTs repeated today-if continues to elevate will further evaluate with additional labs and imaging.  ADHD: Stable Continue Adderall 10 mg twice daily.  Adderall 20 mg tab half tab p.o. twice daily#30 x3. [place]  controlled substance database reviewed today 10/03/2023 Follow-up 2 and half to 3 months virtually for [facility] Screening-pulmonary TB - QuantiFERON-TB Gold Plus   Return in about 11  weeks (around 12/19/2023).  Orders Placed This Encounter  Procedures   Vitamin D  (25 hydroxy)   Hepatic function panel   Hemoglobin A1c   QuantiFERON-TB Gold Plus   Meds ordered this encounter  Medications   amphetamine -dextroamphetamine  (ADDERALL) 20 MG tablet    Sig: Take 0.5 tablets (10 mg total) by mouth 2 (two) times daily.    Dispense:  30 tablet    Refill:  0   irbesartan  (AVAPRO ) 300 MG tablet    Sig: Take 1 tablet (300 mg total) by mouth daily.    Dispense:  30 tablet    Refill:  6   omeprazole  (PRILOSEC) 20 MG capsule    Sig: Take 1 capsule (20 mg total) by mouth daily.    Dispense:  30 capsule    Refill:  5   amphetamine -dextroamphetamine  (ADDERALL) 20 MG tablet    Sig: Take 0.5 tablets (10 mg total) by mouth 2 (two) times daily.    Dispense:  30 tablet    Refill:  0   amphetamine -dextroamphetamine  (ADDERALL) 20 MG tablet    Sig: Take 0.5 tablets (10 mg total) by mouth 2 (two) times daily.    Dispense:  30 tablet    Refill:  0   Referral Orders  No referral(s) requested today     Note is dictated utilizing voice recognition software. Although note has been proof read prior to signing, occasional typographical errors still can be missed. If any questions arise, please do not hesitate to call for verification.  Electronically signed by: Charlies Bellini, DO [facility] [place]

## 2023-10-03 NOTE — Patient Instructions (Signed)

## 2023-10-05 ENCOUNTER — Telehealth: Payer: Self-pay

## 2023-10-05 ENCOUNTER — Ambulatory Visit: Payer: Self-pay | Admitting: Family Medicine

## 2023-10-05 LAB — QUANTIFERON-TB GOLD PLUS
Mitogen-NIL: 9.22 [IU]/mL
NIL: 0.02 [IU]/mL
QuantiFERON-TB Gold Plus: NEGATIVE
TB1-NIL: 0 [IU]/mL
TB2-NIL: 0.01 [IU]/mL

## 2023-10-05 MED ORDER — TIRZEPATIDE 5 MG/0.5ML ~~LOC~~ SOAJ
5.0000 mg | SUBCUTANEOUS | 1 refills | Status: DC
Start: 2023-10-27 — End: 2023-12-07

## 2023-10-05 MED ORDER — MOUNJARO 2.5 MG/0.5ML ~~LOC~~ SOAJ: 2.5000 mg | SUBCUTANEOUS | 0 refills | Status: DC

## 2023-10-05 NOTE — Telephone Encounter (Signed)
 Pharmacy Patient Advocate Encounter   Received notification from CoverMyMeds that prior authorization for Mounjaro  2.5MG /0.5ML auto-injectors is required/requested.   Insurance verification completed.   The patient is insured through [facility] .   Per test claim: PA required; PA submitted to above mentioned insurance via CoverMyMeds Key/confirmation #/EOC [id number] Status is pending

## 2023-10-05 NOTE — Telephone Encounter (Signed)
 TB screening is negative.  Form completed for patient and placed at [facility] work basket

## 2023-10-05 NOTE — Telephone Encounter (Signed)
 Please call patient His vitamin D  is improved but still below normal at 27.6-please encourage him to increase vitamin D3 supplementation by 1000 units daily. His tuberculosis screening is not resolved as of yet His A1c is now 6.7, which is in the diabetic range. His liver enzymes are mildly higher than last collection, but overall would be considered stable.  This is most likely from fatty liver disease.  I have called in Mounjaro  2.5 mg weekly injection to start for 4 weeks, then increase to Mounjaro  5 mg weekly injection until seen in 3 months.   -If needing guidance on proper administration technique, I would encourage him to have a nurse visit for his first injection.   -Most patients find it best to take medication on Friday or Saturday, therefore if any side effects occur they occur over the weekend.  Sometimes people will have nausea, which usually resolves within the first 24-48 hours after injection, as the most common side effect.   Please make sure he has an appointment scheduled in 3 months for follow-up.

## 2023-10-06 ENCOUNTER — Other Ambulatory Visit (HOSPITAL_COMMUNITY): Payer: Self-pay

## 2023-10-06 NOTE — Telephone Encounter (Signed)
 Pharmacy Patient Advocate Encounter  Received notification from [facility] that Prior Authorization for Mounjaro  2.5MG /0.5ML auto-injectors  has been APPROVED from 10/04/2023 to 10/04/2024. Ran test claim, Copay is $35. This test claim was processed through [facility]- copay amounts may vary at other pharmacies due to pharmacy/plan contracts, or as the patient moves through the different stages of their insurance plan.   PA #/Case ID/Reference #: [id number]

## 2023-10-14 DIAGNOSIS — H2513 Age-related nuclear cataract, bilateral: Secondary | ICD-10-CM | POA: Diagnosis not present

## 2023-10-14 DIAGNOSIS — H40033 Anatomical narrow angle, bilateral: Secondary | ICD-10-CM | POA: Diagnosis not present

## 2023-10-26 ENCOUNTER — Encounter: Payer: Self-pay | Admitting: Family Medicine

## 2023-10-26 MED ORDER — ONDANSETRON HCL 4 MG PO TABS: 4.0000 mg | ORAL_TABLET | Freq: Three times a day (TID) | ORAL | 0 refills | Status: DC | PRN

## 2023-10-26 NOTE — Telephone Encounter (Signed)
 I called in Zofran  for him to use for nausea prevention when needed. If he is having severe symptoms I would encourage him to make an appointment for us  to discuss in more detail and collect labs to evaluate his concerns.

## 2023-12-07 ENCOUNTER — Telehealth: Admitting: Family Medicine

## 2023-12-07 ENCOUNTER — Encounter: Payer: Self-pay | Admitting: Family Medicine

## 2023-12-07 DIAGNOSIS — Z7985 Long-term (current) use of injectable non-insulin antidiabetic drugs: Secondary | ICD-10-CM | POA: Diagnosis not present

## 2023-12-07 DIAGNOSIS — E1169 Type 2 diabetes mellitus with other specified complication: Secondary | ICD-10-CM

## 2023-12-07 DIAGNOSIS — E119 Type 2 diabetes mellitus without complications: Secondary | ICD-10-CM | POA: Insufficient documentation

## 2023-12-07 MED ORDER — OZEMPIC (0.25 OR 0.5 MG/DOSE) 2 MG/3ML ~~LOC~~ SOPN: PEN_INJECTOR | SUBCUTANEOUS | 0 refills | Status: DC

## 2023-12-07 MED ORDER — OZEMPIC (0.25 OR 0.5 MG/DOSE) 2 MG/3ML ~~LOC~~ SOPN: 0.5000 mg | PEN_INJECTOR | SUBCUTANEOUS | 5 refills | Status: DC

## 2023-12-07 NOTE — Patient Instructions (Signed)
 Return in about 6 weeks (around 01/16/2024) for Routine chronic condition follow-up.        Great to see you today.  I have refilled the medication(s) we provide.   If labs were collected or images ordered, we will inform you of  results once we have received them and reviewed. We will contact you either by echart message, or telephone call.  Please give ample time to the testing facility, and our office to run,  receive and review results. Please do not call inquiring of results, even if you can see them in your chart. We will contact you as soon as we are able. If it has been over 1 week since the test was completed, and you have not yet heard from us , then please call us .    - echart message- for normal results that have been seen by the patient already.   - telephone call: abnormal results or if patient has not viewed results in their echart.  If a referral to a specialist was entered for you, please call us  in 2 weeks if you have not heard from the specialist office to schedule. '

## 2023-12-07 NOTE — Progress Notes (Signed)
 VIRTUAL VISIT VIA VIDEO  I connected with Anthony Anthony on 12/07/23 at  9:20 AM EDT by a video enabled telemedicine application and verified that I am speaking with the correct person using two identifiers. Location patient: Home Location provider: [facility], Office Persons participating in the virtual visit: Patient, Dr. Catherine and [name], CMA  I discussed the limitations of evaluation and management by telemedicine and the availability of in person appointments. The patient expressed understanding and agreed to proceed.      Patient ID: Anthony Anthony, male  DOB: 08/13/1980, 43 y.o.   MRN: [id number] Patient Care Team    Relationship Specialty Notifications Start End  Anthony Anthony LABOR, DO PCP - General Family Medicine  09/12/17   Anthony Anthony, Anthony Anthony (Inactive)  Dermatology  10/17/19     Chief Complaint  Patient presents with   Medication Reaction    After taking last Monjauro injection; chills, diarrhea, dizzness, nausea. Pt has recently changed jobs/insurance and has not been able to get medication. Pt wanting to discuss possible dosage change of med or other options.     Subjective:  Anthony Anthony is a 43 y.o. male present for diabetes med concern All past medical history, surgical history, allergies, family history, immunizations, medications and social history were updated in the electronic medical record today. All recent labs, ED visits and hospitalizations within the last year were reviewed.  New onset diabetes/morbid obesity Pt reports he started Mounjaro  2.5 mg weekly injection and had rather significant negative side effects including nausea, but mostly chills, diarrhea and dizzy.  He never tapered up to the Mounjaro  5 mg secondary to side effects.  He also switched jobs and switched insurances and needed to wait for new insurance kicked and therefore he has not been taking any medications for his elevated A1c and quite a few weeks.  He denies  numbness, tingling of extremities, hypo/hyperglycemic events or non-healing wounds.  Recently diagnosed with diabetes with an A1c of 6.5 in January, went up to 6.7 in July 2025.     10/03/2023    9:08 AM 04/14/2023    8:45 AM 09/27/2022    8:30 AM 10/20/2021    1:08 PM 01/02/2021    8:01 AM  Depression screen PHQ 2/9  Decreased Interest 0 0 0 0 0  Down, Depressed, Hopeless 0 0 0 0 0  PHQ - 2 Score 0 0 0 0 0  Altered sleeping 0  0    Tired, decreased energy 0  0    Change in appetite 0  0    Feeling bad or failure about yourself  0  0    Trouble concentrating 0  0    Moving slowly or fidgety/restless 0  0    Suicidal thoughts 0  0    PHQ-9 Score 0  0    Difficult doing work/chores Not difficult at all  Not difficult at all        10/03/2023    9:08 AM 09/27/2022    8:30 AM 07/25/2020    1:40 PM 07/24/2019    3:02 PM  GAD 7 : Generalized Anxiety Score  Nervous, Anxious, on Edge 0 0 0 1  Control/stop worrying 0 0 0 0  Worry too much - different things 0 0 0 1  Trouble relaxing 0 0 0 3  Restless 0 0 0 3  Easily annoyed or irritable 0 0 0 0  Afraid - awful might happen 0 0 0  0  Total GAD 7 Score 0 0 0 8  Anxiety Difficulty Not difficult at all Not difficult at all  Somewhat difficult           10/03/2023    9:08 AM 04/14/2023    8:45 AM 09/27/2022    8:29 AM 09/12/2017    9:37 AM  Fall Risk   Falls in the past year? 0 0 0 No   Number falls in past yr:  0    Injury with Fall?  0 0   Risk for fall due to :  No Fall Risks No Fall Risks   Follow up Falls evaluation completed Falls evaluation completed Falls evaluation completed      Data saved with a previous flowsheet row definition    Immunization History  Administered Date(s) Administered   DTaP 09/01/1981, 11/03/1981, 12/29/1981, 07/19/1984, 07/17/1986   IPV 09/01/1981, 11/03/1981, 03/02/1982, 07/17/1986   Influenza Split 03/30/2014   Influenza, Seasonal, Injecte, Preservative Fre 04/14/2023   Influenza,inj,Quad PF,6+ Mos  11/25/2017, 12/14/2018, 01/02/2021, 04/12/2022   MMR 10/12/1982   PNEUMOCOCCAL CONJUGATE-20 04/14/2023   PPD Test 10/20/2021   Tdap 03/30/2014    Past Medical History:  Diagnosis Date   Allergy    Anal fissure    Asthma    Attention deficit 11/25/2017   Bilateral inguinal hernia (BIH) 05/21/2019   Calcaneal spur of foot, right 06/17/2022   Chicken pox    Diabetes mellitus (HCC) 12/07/2023   GERD (gastroesophageal reflux disease)    HTN (hypertension)    Mild anxiety    Prediabetes 10/03/2017   Retinal hemorrhage 09/02/2017   Tick bite 12/14/2018   Allergies  Allergen Reactions   Penicillins Rash    Rash only-as a child    Past Surgical History:  Procedure Laterality Date   HERNIA REPAIR     PLANTAR FASCIA RELEASE     SPHINCTEROTOMY  2020   Family History  Problem Relation Age of Onset   Diabetes Father    Heart disease Father    Hyperlipidemia Father    Hypertension Father    Drug abuse Sister    Alcohol abuse Sister    Arthritis Paternal Grandmother    Depression Paternal Grandmother    Diabetes Paternal Grandmother    Hyperlipidemia Paternal Grandmother    Heart disease Paternal Grandmother    Hypertension Paternal Grandmother    Arthritis Paternal Grandfather    Hearing loss Paternal Grandfather    Heart disease Paternal Grandfather    Hyperlipidemia Paternal Grandfather    Hypertension Paternal Grandfather    Kidney disease Paternal Grandfather    Drug abuse Brother    Social History   Social History Narrative   Married, employed,    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    Drinks caffeine.    Smoke alarm in the home. Wears his seatbelt.    Feels safe in his relationship.     Allergies as of 12/07/2023       Reactions   Penicillins Rash   Rash only-as a child        Medication List        Accurate as of December 07, 2023  2:18 PM. If you have any questions, ask your nurse or doctor.          STOP taking these  medications    Mounjaro  2.5 MG/0.5ML Pen Generic drug: tirzepatide  Stopped by: Anthony Anthony   tirzepatide  5 MG/0.5ML Pen Commonly known as: MOUNJARO  Stopped by: Anthony Anthony  TAKE these medications    albuterol  108 (90 Base) MCG/ACT inhaler Commonly known as: VENTOLIN  HFA INHALE 2 PUFFS BY MOUTH EVERY 4 HOURS AS NEEDED FOR WHEEZING OR SHORTNESS OF BREATH   amphetamine -dextroamphetamine  20 MG tablet Commonly known as: ADDERALL Take 0.5 tablets (10 mg total) by mouth 2 (two) times daily.   amphetamine -dextroamphetamine  20 MG tablet Commonly known as: ADDERALL Take 0.5 tablets (10 mg total) by mouth 2 (two) times daily.   amphetamine -dextroamphetamine  20 MG tablet Commonly known as: ADDERALL Take 0.5 tablets (10 mg total) by mouth 2 (two) times daily.   fluticasone  50 MCG/ACT nasal spray Commonly known as: FLONASE  USE 2 SPRAY IN EACH NOSTRIL DAILY   irbesartan  300 MG tablet Commonly known as: Avapro  Take 1 tablet (300 mg total) by mouth daily.   omeprazole  20 MG capsule Commonly known as: PRILOSEC Take 1 capsule (20 mg total) by mouth daily.   ondansetron  4 MG tablet Commonly known as: ZOFRAN  Take 1 tablet (4 mg total) by mouth every 8 (eight) hours as needed for nausea or vomiting.   Ozempic  (0.25 or 0.5 MG/DOSE) 2 MG/3ML Sopn Generic drug: Semaglutide (0.25 or 0.5MG /DOS) Inject 0.25 mg into the skin once a week for 28 days, THEN 0.5 mg once a week for 14 days. Start taking on: December 07, 2023 Started by: Anthony Anthony   Ozempic  (0.25 or 0.5 MG/DOSE) 2 MG/3ML Sopn Generic drug: Semaglutide (0.25 or 0.5MG /DOS) Inject 0.5 mg into the skin once a week. Start taking on: December 29, 2023 Started by: Anthony Anthony       All past medical history, surgical history, allergies, family history, immunizations andmedications were updated in the EMR today and reviewed under the history and medication portions of their EMR.       No results found.  ROS: 14 pt  review of systems performed and negative (unless mentioned in an HPI)  Objective: There were no vitals taken for this visit. Physical Exam Vitals and nursing note reviewed.  Constitutional:      General: He is not in acute distress.    Appearance: Normal appearance. He is obese. He is not toxic-appearing.  HENT:     Head: Normocephalic and atraumatic.  Eyes:     General: No scleral icterus.       Right eye: No discharge.        Left eye: No discharge.     Conjunctiva/sclera: Conjunctivae normal.  Pulmonary:     Effort: Pulmonary effort is normal.  Musculoskeletal:     Cervical back: Normal range of motion.  Skin:    Findings: No rash.  Neurological:     Mental Status: He is alert and oriented to person, place, and time. Mental status is at baseline.  Psychiatric:        Mood and Affect: Mood normal.        Behavior: Behavior normal.        Thought Content: Thought content normal.        Judgment: Judgment normal.    Assessment/plan: Esa Raden is a 43 y.o. male present for  diabetes type II new onset-morbid obesity Last A1c 6.5> 6.7 Low glycemic diet encouraged. Negative side effects to Mounjaro , discussed use of Ozempic  in its place.  He may experience the same side effects if he is unable to tolerate Ozempic  could consider weekly injection format of Victoza. Close follow-up recommended with repeat A1c at that time  Return in about 6 weeks (around 01/16/2024) for Routine chronic condition follow-up.  No  orders of the defined types were placed in this encounter.  Meds ordered this encounter  Medications   Semaglutide ,0.25 or 0.5MG /DOS, (OZEMPIC , 0.25 OR 0.5 MG/DOSE,) 2 MG/3ML SOPN    Sig: Inject 0.25 mg into the skin once a week for 28 days, THEN 0.5 mg once a week for 14 days.    Dispense:  3 mL    Refill:  0   Semaglutide ,0.25 or 0.5MG /DOS, (OZEMPIC , 0.25 OR 0.5 MG/DOSE,) 2 MG/3ML SOPN    Sig: Inject 0.5 mg into the skin once a week.    Dispense:  3 mL     Refill:  5    Use taper prescription with earlier date first please   Referral Orders  No referral(s) requested today     Note is dictated utilizing voice recognition software. Although note has been proof read prior to signing, occasional typographical errors still can be missed. If any questions arise, please do not hesitate to call for verification.  Electronically signed by: Anthony Bellini, DO [facility]

## 2023-12-14 ENCOUNTER — Other Ambulatory Visit (HOSPITAL_COMMUNITY): Payer: Self-pay

## 2023-12-14 ENCOUNTER — Telehealth: Payer: Self-pay

## 2023-12-14 NOTE — Telephone Encounter (Signed)
 Pharmacy Patient Advocate Encounter   Received notification from Onbase that prior authorization for Ozempic  (0.25 or 0.5 MG/DOSE) 2MG /3ML pen-injectors  is required/requested.   Insurance verification completed.   The patient is insured through [facility] .   Per test claim: PA required; PA submitted to above mentioned insurance via Latent Key/confirmation #/EOC [id number] Status is pending

## 2023-12-14 NOTE — Telephone Encounter (Signed)
 Pharmacy Patient Advocate Encounter  Received notification from [facility] that Prior Authorization for Ozempic  (0.25 or 0.5 MG/DOSE) 2MG /3ML pen-injectors  has been APPROVED from 12/14/23 to 12/14/26   PA #/Case ID/Reference #: [id number]

## 2023-12-19 ENCOUNTER — Ambulatory Visit: Admitting: Family Medicine

## 2023-12-20 ENCOUNTER — Ambulatory Visit: Admitting: Family Medicine

## 2024-01-03 ENCOUNTER — Encounter: Payer: Self-pay | Admitting: Family Medicine

## 2024-01-04 MED ORDER — AMPHETAMINE-DEXTROAMPHETAMINE 20 MG PO TABS: 10.0000 mg | ORAL_TABLET | Freq: Two times a day (BID) | ORAL | 0 refills | Status: DC

## 2024-01-04 NOTE — Telephone Encounter (Signed)
Sent med to new pharmacy 

## 2024-01-05 ENCOUNTER — Other Ambulatory Visit (HOSPITAL_COMMUNITY): Payer: Self-pay

## 2024-01-05 ENCOUNTER — Telehealth: Payer: Self-pay

## 2024-01-05 NOTE — Telephone Encounter (Signed)
.  Pharmacy Patient Advocate Encounter  Received notification from [facility] that Prior Authorization for Amphetamine -Dextroamphetamine  20MG  tablets  has been APPROVED from 01/05/24 to 01/05/27. Unable to obtain price due to refill too soon rejection, last fill date 01/05/24 next available fill date11/09/25   PA #/Case ID/Reference #: [id number]

## 2024-01-05 NOTE — Telephone Encounter (Signed)
 Pharmacy Patient Advocate Encounter   Received notification from Onbase that prior authorization for Amphetamine -Dextroamphetamine  20MG  tablets  is required/requested.   Insurance verification completed.   The patient is insured through [facility].   Per test claim: PA required; PA submitted to above mentioned insurance via Latent Key/confirmation #/EOC [facility] Status is pending

## 2024-01-20 ENCOUNTER — Ambulatory Visit: Admitting: Family Medicine

## 2024-01-20 ENCOUNTER — Encounter: Payer: Self-pay | Admitting: Family Medicine

## 2024-01-20 VITALS — BP 124/75 | HR 70 | Temp 98.1°F | Wt 373.4 lb

## 2024-01-20 DIAGNOSIS — E1169 Type 2 diabetes mellitus with other specified complication: Secondary | ICD-10-CM

## 2024-01-20 DIAGNOSIS — I10 Essential (primary) hypertension: Secondary | ICD-10-CM

## 2024-01-20 DIAGNOSIS — F902 Attention-deficit hyperactivity disorder, combined type: Secondary | ICD-10-CM | POA: Diagnosis not present

## 2024-01-20 DIAGNOSIS — Z7985 Long-term (current) use of injectable non-insulin antidiabetic drugs: Secondary | ICD-10-CM

## 2024-01-20 DIAGNOSIS — J4520 Mild intermittent asthma, uncomplicated: Secondary | ICD-10-CM

## 2024-01-20 DIAGNOSIS — E559 Vitamin D deficiency, unspecified: Secondary | ICD-10-CM | POA: Diagnosis not present

## 2024-01-20 DIAGNOSIS — Z6841 Body Mass Index (BMI) 40.0 and over, adult: Secondary | ICD-10-CM

## 2024-01-20 DIAGNOSIS — L989 Disorder of the skin and subcutaneous tissue, unspecified: Secondary | ICD-10-CM | POA: Insufficient documentation

## 2024-01-20 DIAGNOSIS — Z23 Encounter for immunization: Secondary | ICD-10-CM

## 2024-01-20 DIAGNOSIS — Z79899 Other long term (current) drug therapy: Secondary | ICD-10-CM

## 2024-01-20 DIAGNOSIS — Z5181 Encounter for therapeutic drug level monitoring: Secondary | ICD-10-CM

## 2024-01-20 LAB — COMPREHENSIVE METABOLIC PANEL WITH GFR
ALT: 49 U/L (ref 0–53)
AST: 29 U/L (ref 0–37)
Albumin: 4.4 g/dL (ref 3.5–5.2)
Alkaline Phosphatase: 59 U/L (ref 39–117)
BUN: 14 mg/dL (ref 6–23)
CO2: 29 meq/L (ref 19–32)
Calcium: 9.1 mg/dL (ref 8.4–10.5)
Chloride: 104 meq/L (ref 96–112)
Creatinine, Ser: 0.92 mg/dL (ref 0.40–1.50)
GFR: 102.52 mL/min (ref >60.00)
Glucose, Bld: 95 mg/dL (ref 70–99)
Potassium: 4.3 meq/L (ref 3.5–5.1)
Sodium: 140 meq/L (ref 135–145)
Total Bilirubin: 0.5 mg/dL (ref 0.2–1.2)
Total Protein: 6.9 g/dL (ref 6.0–8.3)

## 2024-01-20 LAB — VITAMIN D 25 HYDROXY (VIT D DEFICIENCY, FRACTURES): VITD: 45.7 ng/mL (ref 30.00–100.00)

## 2024-01-20 LAB — HEMOGLOBIN A1C: Hgb A1c MFr Bld: 5.8 % (ref 4.6–6.5)

## 2024-01-20 LAB — MICROALBUMIN / CREATININE URINE RATIO
Creatinine,U: 110.8 mg/dL
Microalb Creat Ratio: UNDETERMINED mg/g (ref 0.0–30.0)
Microalb, Ur: <0.7 mg/dL

## 2024-01-20 MED ORDER — ONDANSETRON HCL 4 MG PO TABS: 4.0000 mg | ORAL_TABLET | Freq: Three times a day (TID) | ORAL | 3 refills | Status: AC | PRN

## 2024-01-20 MED ORDER — AMPHETAMINE-DEXTROAMPHETAMINE 20 MG PO TABS: 10.0000 mg | ORAL_TABLET | Freq: Two times a day (BID) | ORAL | 0 refills | Status: DC

## 2024-01-20 MED ORDER — SEMAGLUTIDE (1 MG/DOSE) 4 MG/3ML ~~LOC~~ SOPN: 1.0000 mg | PEN_INJECTOR | SUBCUTANEOUS | 1 refills | Status: DC

## 2024-01-20 MED ORDER — ALBUTEROL SULFATE HFA 108 (90 BASE) MCG/ACT IN AERS: INHALATION_SPRAY | RESPIRATORY_TRACT | 5 refills | Status: DC

## 2024-01-20 MED ORDER — OMEPRAZOLE 20 MG PO CPDR: 20.0000 mg | DELAYED_RELEASE_CAPSULE | Freq: Every day | ORAL | 1 refills | Status: DC

## 2024-01-20 MED ORDER — IRBESARTAN 300 MG PO TABS: 300.0000 mg | ORAL_TABLET | Freq: Every day | ORAL | 1 refills | Status: DC

## 2024-01-20 NOTE — Progress Notes (Signed)
 Patient ID: Nicholas Anthony, male  DOB: [birth year], 43 y.o.   MRN: [id number] Patient Care Team    Relationship Specialty Notifications Start End  Catherine Charlies LABOR, DO PCP - General Family Medicine  09/12/17   Donna Lamar, DPM  Podiatry  01/20/24   [facility]    01/20/24     Chief Complaint  Patient presents with   Diabetes    Chronic condition management Eye exam records- rr Influenza vaccine- given    Subjective: Nicholas Anthony is a 43 y.o. male present for chronic Conditions/illness Management All past medical history, surgical history, allergies, family history, immunizations, medications and social history were updated in the electronic medical record today. All recent labs, ED visits and hospitalizations within the last year were reviewed.  Hypertension/morbid obesity/ophthalmological abnormality:  Patient reports compliance with   irbesartan  300mg  qd.  Patient denies chest pain, shortness of breath, dizziness or lower extremity edema.   Pt does not take a  daily baby ASA. Pt is is not  prescribed statin. Diet: low sodium Exercise: encouraged routinely.  RF: HTN, obesity, Fhx heart disease  GERD: Symptoms well-controlled with omeprazole  20 mg daily.  ADHD: Patient reports compliance with Adderall 10 mg twice daily, he feels this medication is working well and would like to continue.  He has not experienced any negative side effects.  It is helping focus.  Type 2 diabetes mellitus with other specified complication, without long-term current use of insulin (HCC) (Primary) Diagnosis 2025 A1c 6.7 Ozempic  started and he is tolerating- just started 0.5 mg dose.  Patient denies dizziness, hyperglycemic or hypoglycemic events. Patient denies numbness, tingling in the extremities or nonhealing wounds of feet.   Mild intermittent asthma without complication Asthma has been stable.  Requiring albuterol . .     10/03/2023    9:08 AM 04/14/2023    8:45 AM  09/27/2022    8:30 AM 10/20/2021    1:08 PM 01/02/2021    8:01 AM  Depression screen PHQ 2/9  Decreased Interest 0 0 0 0 0  Down, Depressed, Hopeless 0 0 0 0 0  PHQ - 2 Score 0 0 0 0 0  Altered sleeping 0  0    Tired, decreased energy 0  0    Change in appetite 0  0    Feeling bad or failure about yourself  0  0    Trouble concentrating 0  0    Moving slowly or fidgety/restless 0  0    Suicidal thoughts 0  0    PHQ-9 Score 0  0    Difficult doing work/chores Not difficult at all  Not difficult at all        10/03/2023    9:08 AM 09/27/2022    8:30 AM 07/25/2020    1:40 PM 07/24/2019    3:02 PM  GAD 7 : Generalized Anxiety Score  Nervous, Anxious, on Edge 0 0 0 1  Control/stop worrying 0 0 0 0  Worry too much - different things 0 0 0 1  Trouble relaxing 0 0 0 3  Restless 0 0 0 3  Easily annoyed or irritable 0 0 0 0  Afraid - awful might happen 0 0 0 0  Total GAD 7 Score 0 0 0 8  Anxiety Difficulty Not difficult at all Not difficult at all  Somewhat difficult           10/03/2023    9:08 AM 04/14/2023  8:45 AM 09/27/2022    8:29 AM 09/12/2017    9:37 AM  Fall Risk   Falls in the past year? 0 0 0 No   Number falls in past yr:  0    Injury with Fall?  0 0   Risk for fall due to :  No Fall Risks No Fall Risks   Follow up Falls evaluation completed Falls evaluation completed Falls evaluation completed      Data saved with a previous flowsheet row definition    Immunization History  Administered Date(s) Administered   DTaP 09/01/1981, 11/03/1981, 12/29/1981, 07/19/1984, 07/17/1986   IPV 09/01/1981, 11/03/1981, 03/02/1982, 07/17/1986   Influenza Split 03/30/2014   Influenza, Seasonal, Injecte, Preservative Fre 04/14/2023, 01/20/2024   Influenza,inj,Quad PF,6+ Mos 11/25/2017, 12/14/2018, 01/02/2021, 04/12/2022   MMR 10/12/1982   PNEUMOCOCCAL CONJUGATE-20 04/14/2023   PPD Test 10/20/2021   Tdap 03/30/2014    Past Medical History:  Diagnosis Date   Allergy    Anal fissure     Asthma    Attention deficit 11/25/2017   Bilateral inguinal hernia (BIH) 05/21/2019   Calcaneal spur of foot, right 06/17/2022   Chicken pox    Diabetes mellitus (HCC) 12/07/2023   GERD (gastroesophageal reflux disease)    HTN (hypertension)    Mild anxiety    Plantar fasciitis, chronic 08/27/2020   Electing to have surgery after failure of non-surgical options     Prediabetes 10/03/2017   Retinal hemorrhage 09/02/2017   Tick bite 12/14/2018   Allergies  Allergen Reactions   Penicillins Rash    Rash only-as a child    Past Surgical History:  Procedure Laterality Date   HERNIA REPAIR     PLANTAR FASCIA RELEASE     SPHINCTEROTOMY  2020   Family History  Problem Relation Age of Onset   Diabetes Father    Heart disease Father    Hyperlipidemia Father    Hypertension Father    Drug abuse Sister    Alcohol abuse Sister    Arthritis Paternal Grandmother    Depression Paternal Grandmother    Diabetes Paternal Grandmother    Hyperlipidemia Paternal Grandmother    Heart disease Paternal Grandmother    Hypertension Paternal Grandmother    Arthritis Paternal Grandfather    Hearing loss Paternal Grandfather    Heart disease Paternal Grandfather    Hyperlipidemia Paternal Grandfather    Hypertension Paternal Grandfather    Kidney disease Paternal Grandfather    Drug abuse Brother    Social History   Social History Narrative   Married, employed,    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    Drinks caffeine.    Smoke alarm in the home. Wears his seatbelt.    Feels safe in his relationship.     Allergies as of 01/20/2024       Reactions   Penicillins Rash   Rash only-as a child        Medication List        Accurate as of January 20, 2024 10:35 AM. If you have any questions, ask your nurse or doctor.          STOP taking these medications    Ozempic  (0.25 or 0.5 MG/DOSE) 2 MG/3ML Sopn Generic drug: Semaglutide (0.25 or  0.5MG /DOS) Replaced by: Semaglutide  (1 MG/DOSE) 4 MG/3ML Sopn You also have another medication with the same name that you need to continue taking as instructed. Stopped by: Charlies Bellini       TAKE these  medications    albuterol  108 (90 Base) MCG/ACT inhaler Commonly known as: VENTOLIN  HFA INHALE 2 PUFFS BY MOUTH EVERY 4 HOURS AS NEEDED FOR WHEEZING OR SHORTNESS OF BREATH   amphetamine -dextroamphetamine  20 MG tablet Commonly known as: ADDERALL Take 0.5 tablets (10 mg total) by mouth 2 (two) times daily. Start taking on: February 15, 2024 What changed:  These instructions start on February 15, 2024. If you are unsure what to do until then, ask your doctor or other care provider. Another medication with the same name was removed. Continue taking this medication, and follow the directions you see here. Changed by: Charlies Bellini   amphetamine -dextroamphetamine  20 MG tablet Commonly known as: ADDERALL Take 0.5 tablets (10 mg total) by mouth 2 (two) times daily. Start taking on: March 30, 2024 What changed:  These instructions start on March 30, 2024. If you are unsure what to do until then, ask your doctor or other care provider. Another medication with the same name was removed. Continue taking this medication, and follow the directions you see here. Changed by: Charlies Bellini   fluticasone  50 MCG/ACT nasal spray Commonly known as: FLONASE  USE 2 SPRAY IN EACH NOSTRIL DAILY   irbesartan  300 MG tablet Commonly known as: Avapro  Take 1 tablet (300 mg total) by mouth daily.   omeprazole  20 MG capsule Commonly known as: PRILOSEC Take 1 capsule (20 mg total) by mouth daily.   ondansetron  4 MG tablet Commonly known as: ZOFRAN  Take 1 tablet (4 mg total) by mouth every 8 (eight) hours as needed for nausea or vomiting.   Ozempic  (0.25 or 0.5 MG/DOSE) 2 MG/3ML Sopn Generic drug: Semaglutide (0.25 or 0.5MG /DOS) Inject 0.5 mg into the skin once a week. What changed:  Another  medication with the same name was added. Make sure you understand how and when to take each. Another medication with the same name was removed. Continue taking this medication, and follow the directions you see here. Changed by: Charlies Bellini   Semaglutide  (1 MG/DOSE) 4 MG/3ML Sopn Inject 1 mg as directed once a week. Start taking on: February 18, 2024 What changed: You were already taking a medication with the same name, and this prescription was added. Make sure you understand how and when to take each. Replaces: Ozempic  (0.25 or 0.5 MG/DOSE) 2 MG/3ML Sopn Changed by: Charlies Bellini       All past medical history, surgical history, allergies, family history, immunizations andmedications were updated in the EMR today and reviewed under the history and medication portions of their EMR.       No results found.  ROS: 14 pt review of systems performed and negative (unless mentioned in an HPI)  Objective: BP 124/75   Pulse 70   Temp 98.1 F (36.7 C)   Wt (!) 373 lb 6.4 oz (169.4 kg)   SpO2 97%   BMI 47.42 kg/m  Physical Exam Vitals and nursing note reviewed. Exam conducted with a chaperone present.  Constitutional:      General: He is not in acute distress.    Appearance: Normal appearance. He is obese. He is not ill-appearing, toxic-appearing or diaphoretic.  HENT:     Head: Normocephalic and atraumatic.  Eyes:     General: No scleral icterus.       Right eye: No discharge.        Left eye: No discharge.     Extraocular Movements: Extraocular movements intact.     Conjunctiva/sclera: Conjunctivae normal.     Pupils: Pupils are  equal, round, and reactive to light.  Cardiovascular:     Rate and Rhythm: Normal rate and regular rhythm.     Heart sounds: No murmur heard. Pulmonary:     Effort: Pulmonary effort is normal. No respiratory distress.     Breath sounds: Normal breath sounds. No wheezing, rhonchi or rales.  Musculoskeletal:     Cervical back: Neck supple.     Right  lower leg: No edema.     Left lower leg: No edema.  Skin:    General: Skin is warm.     Findings: Lesion (4mm raised-domed hard scaley flesh toned left lateral ankle) present. No rash.  Neurological:     Mental Status: He is alert and oriented to person, place, and time. Mental status is at baseline.  Psychiatric:        Mood and Affect: Mood normal.        Behavior: Behavior normal.        Thought Content: Thought content normal.        Judgment: Judgment normal.    Diabetic Foot Exam - Simple   Simple Foot Form Diabetic Foot exam was performed with the following findings: Yes 01/20/2024 10:35 AM  Visual Inspection No deformities, no ulcerations, no other skin breakdown bilaterally: Yes Sensation Testing Intact to touch and monofilament testing bilaterally: Yes Pulse Check Posterior Tibialis and Dorsalis pulse intact bilaterally: Yes Comments     Assessment/plan: Nicholas Anthony is a 43 y.o. male present for Chronic Conditions/illness Management Hypertension/obesity:  Stable Continue Avapro  to 300 mg qd - Continue diet and weight loss regimen, routine exercise.  -Low sodium diet.   Gastroesophageal reflux disease without esophagitis/long-term proton pump use Stable Continue protonix B12, vitamin D  and magnesium levels UTD 03/2023 with extremely low vitamin D .  Vitamin D  deficiency Patient had been encouraged to start vitamin D  3 2000 units daily OTC-taking Vitamin D  collected today  Diabetes type 2-morbid obesity Last A1c 6.5 >6.7 LOW Glycemic diet encouraged. A1c collected today Continue to taper Ozempic -0.5 mg weekly injection x 5 more weeks, then increase to Ozempic  1 mg weekly injection. Microalbumin: Collected 01/20/2024 Eye exam: UTD-[facility] in [place]-records requested Prevnar: Completed 03/2023 Influenza:provided today  Elevated LFTs Mildly elevated LFTs, most likely fatty liver disease. CMP collected today  ADHD: Stable Continue  Adderall 10 mg twice daily.  Adderall 20 mg tab half tab p.o. twice daily#30 x3. 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 Not changed much that he can recall. No prior injury to area Derm referral placed.   Influenza vaccine provided today  Return in about 14 weeks (around 04/27/2024) for Routine chronic condition follow-up.  Orders Placed This Encounter  Procedures   Flu vaccine trivalent PF, 6mos and older(Flulaval,Afluria,Fluarix,Fluzone)   Urine Microalbumin w/creat. ratio   Comp Met (CMET)   Hemoglobin A1c   Vitamin D  (25 hydroxy)   Ambulatory referral to Dermatology   Meds ordered this encounter  Medications   amphetamine -dextroamphetamine  (ADDERALL) 20 MG tablet    Sig: Take 0.5 tablets (10 mg total) by mouth 2 (two) times daily.    Dispense:  90 tablet    Refill:  0   amphetamine -dextroamphetamine  (ADDERALL) 20 MG tablet    Sig: Take 0.5 tablets (10 mg total) by mouth 2 (two) times daily.    Dispense:  90 tablet    Refill:  0  albuterol  (VENTOLIN  HFA) 108 (90 Base) MCG/ACT inhaler    Sig: INHALE 2 PUFFS BY MOUTH EVERY 4 HOURS AS NEEDED FOR WHEEZING OR SHORTNESS OF BREATH    Dispense:  8.5 g    Refill:  5   omeprazole  (PRILOSEC) 20 MG capsule    Sig: Take 1 capsule (20 mg total) by mouth daily.    Dispense:  90 capsule    Refill:  1   Semaglutide , 1 MG/DOSE, 4 MG/3ML SOPN    Sig: Inject 1 mg as directed once a week.    Dispense:  3 mL    Refill:  1   ondansetron  (ZOFRAN ) 4 MG tablet    Sig: Take 1 tablet (4 mg total) by mouth every 8 (eight) hours as needed for nausea or vomiting.    Dispense:  20 tablet    Refill:  3   irbesartan  (AVAPRO ) 300 MG tablet    Sig: Take 1 tablet (300 mg total) by mouth daily.    Dispense:  90 tablet    Refill:  1   Referral Orders         Ambulatory referral to Dermatology        Note is dictated utilizing voice recognition software. Although note has been proof read prior to signing, occasional typographical errors still can be missed. If any questions arise, please do not hesitate to call for verification.  Electronically signed by: Charlies Bellini, DO [facility] [place]

## 2024-01-20 NOTE — Patient Instructions (Addendum)
 Return in about 14 weeks (around 04/27/2024) for Routine chronic condition follow-up.        Great to see you today.  I have refilled the medication(s) we provide.   If labs were collected or images ordered, we will inform you of  results once we have received them and reviewed. We will contact you either by echart message, or telephone call.  Please give ample time to the testing facility, and our office to run,  receive and review results. Please do not call inquiring of results, even if you can see them in your chart. We will contact you as soon as we are able. If it has been over 1 week since the test was completed, and you have not yet heard from us , then please call us .    - echart message- for normal results that have been seen by the patient already.   - telephone call: abnormal results or if patient has not viewed results in their echart.  If a referral to a specialist was entered for you, please call us  in 2 weeks if you have not heard from the specialist office to schedule.

## 2024-01-23 ENCOUNTER — Ambulatory Visit: Payer: Self-pay | Admitting: Family Medicine

## 2024-03-14 ENCOUNTER — Ambulatory Visit
Admission: EM | Admit: 2024-03-14 | Discharge: 2024-03-14 | Disposition: A | Discharge status: Home / Self Care | Attending: Nurse Practitioner | Admitting: Nurse Practitioner

## 2024-03-14 DIAGNOSIS — S29012A Strain of muscle and tendon of back wall of thorax, initial encounter: Secondary | ICD-10-CM

## 2024-03-14 DIAGNOSIS — B9689 Other specified bacterial agents as the cause of diseases classified elsewhere: Secondary | ICD-10-CM

## 2024-03-14 DIAGNOSIS — J019 Acute sinusitis, unspecified: Secondary | ICD-10-CM

## 2024-03-14 MED ORDER — CYCLOBENZAPRINE HCL 10 MG PO TABS: 10.0000 mg | ORAL_TABLET | Freq: Two times a day (BID) | ORAL | 0 refills | Status: AC | PRN

## 2024-03-14 MED ORDER — DOXYCYCLINE HYCLATE 100 MG PO CAPS: 100.0000 mg | ORAL_CAPSULE | Freq: Two times a day (BID) | ORAL | 0 refills | Status: AC

## 2024-03-14 NOTE — Discharge Instructions (Signed)
 You have a sinus infection.  Take the doxycycline  twice daily for 7 days as prescribed to treat it.  Also recommend continue Mucinex 600 mg twice daily and nasal saline rinses.  Ensure you are staying well hydrated with water.   The pain in your back is consistent with a muscle strain.  Recommend light range of motion and stretching exercises.  You can take the Flexeril  up to twice daily as needed for muscular pain.  Seek care if symptoms do not improve or if they worsen with treatment.

## 2024-03-14 NOTE — ED Triage Notes (Signed)
 Pt reports he has a headache, facial pressure, and nasal congestion x 1 week   Reports back pain x 3 weeks

## 2024-03-14 NOTE — ED Provider Notes (Signed)
 " [facility]    CSN: [id number] Arrival date & time: 03/14/24  1333      History   Chief Complaint No chief complaint on file.   HPI Nicholas Anthony is a 43 y.o. male.   Patient presents today with 1 week history of nasal congestion, runny nose, sinus pressure in his left cheek, headache, bilateral ear fullness, and fatigue.  He denies fever, body aches or chills, shortness of breath or chest pain, sore throat, abdominal pain, nausea/vomiting, and diarrhea.  No change in appetite.  Has been taking Flonase , using nasal saline, and Mucinex for symptoms without much improvement.  Reports history of bacterial sinus infections around the same time of year.  Typically improve after doxycycline .  Patient also reports vomiting 3 weeks ago after recent dose increase of semaglutide .  Since that time, he has had a sharp pain under his left scapula.  Reports it feels like there is something poking him.  No swelling, bruising, or redness.  He also had some rib cage pain that is now improved.  Denies chest pain or shortness of breath or breathing with inspiration or expiration.  Reports standing and sitting make the pain worse.  Certain movements also make the pain worse.  He has tried IcyHot and topical creams without much improvement.  Has also tried Motrin  and Tylenol  without improvement.    Past Medical History:  Diagnosis Date   Allergy    Anal fissure    Asthma    Attention deficit 11/25/2017   Bilateral inguinal hernia (BIH) 05/21/2019   Calcaneal spur of foot, right 06/17/2022   Chicken pox    Diabetes mellitus (HCC) 12/07/2023   GERD (gastroesophageal reflux disease)    HTN (hypertension)    Mild anxiety    Plantar fasciitis, chronic 08/27/2020   Electing to have surgery after failure of non-surgical options     Prediabetes 10/03/2017   Retinal hemorrhage 09/02/2017   Tick bite 12/14/2018    Patient Active Problem List   Diagnosis Date Noted   Skin lesion 01/20/2024    Diabetes mellitus (HCC) 12/07/2023   Long-term current use of injectable noninsulin antidiabetic medication 12/07/2023   ADHD 04/14/2023   Encounter for monitoring long-term proton pump inhibitor therapy 04/13/2023   GERD (gastroesophageal reflux disease) 10/20/2021   Allergic rhinitis 07/09/2019   Mild intermittent asthma 04/30/2019   Eustachian tube dysfunction, left 04/30/2019   Morbid obesity with BMI of 50.0-59.9, adult (HCC) 09/12/2017   Essential hypertension 09/12/2017    Past Surgical History:  Procedure Laterality Date   HERNIA REPAIR     PLANTAR FASCIA RELEASE     SPHINCTEROTOMY  2020       Home Medications    Prior to Admission medications  Medication Sig Start Date End Date Taking? Authorizing Provider  cyclobenzaprine  (FLEXERIL ) 10 MG tablet Take 1 tablet (10 mg total) by mouth 2 (two) times daily as needed for muscle spasms. Do not take with alcohol or while driving or operating heavy machinery.  May cause drowsiness. 03/14/24  Yes Chandra Harlene LABOR, NP  doxycycline  (VIBRAMYCIN ) 100 MG capsule Take 1 capsule (100 mg total) by mouth 2 (two) times daily for 7 days. 03/14/24 03/21/24 Yes Chandra Harlene LABOR, NP  albuterol  (VENTOLIN  HFA) 108 (90 Base) MCG/ACT inhaler INHALE 2 PUFFS BY MOUTH EVERY 4 HOURS AS NEEDED FOR WHEEZING OR SHORTNESS OF BREATH 01/20/24   Kuneff, Renee A, DO  amphetamine -dextroamphetamine  (ADDERALL) 20 MG tablet Take 0.5 tablets (10 mg total) by mouth  2 (two) times daily. 02/15/24   Kuneff, Renee A, DO  amphetamine -dextroamphetamine  (ADDERALL) 20 MG tablet Take 0.5 tablets (10 mg total) by mouth 2 (two) times daily. 03/30/24   Kuneff, Renee A, DO  fluticasone  (FLONASE ) 50 MCG/ACT nasal spray USE 2 SPRAY IN EACH NOSTRIL DAILY 04/14/23   Kuneff, Renee A, DO  irbesartan  (AVAPRO ) 300 MG tablet Take 1 tablet (300 mg total) by mouth daily. 01/20/24   Kuneff, Renee A, DO  omeprazole  (PRILOSEC) 20 MG capsule Take 1 capsule (20 mg total) by mouth daily.  01/20/24   Kuneff, Renee A, DO  ondansetron  (ZOFRAN ) 4 MG tablet Take 1 tablet (4 mg total) by mouth every 8 (eight) hours as needed for nausea or vomiting. 01/20/24   Kuneff, Renee A, DO  Semaglutide , 1 MG/DOSE, 4 MG/3ML SOPN Inject 1 mg as directed once a week. 02/18/24   Kuneff, Renee A, DO  Semaglutide ,0.25 or 0.5MG /DOS, (OZEMPIC , 0.25 OR 0.5 MG/DOSE,) 2 MG/3ML SOPN Inject 0.5 mg into the skin once a week. 12/29/23   Catherine Charlies LABOR, DO    Family History Family History  Problem Relation Age of Onset   Diabetes Father    Heart disease Father    Hyperlipidemia Father    Hypertension Father    Drug abuse Sister    Alcohol abuse Sister    Arthritis Paternal Grandmother    Depression Paternal Grandmother    Diabetes Paternal Grandmother    Hyperlipidemia Paternal Grandmother    Heart disease Paternal Grandmother    Hypertension Paternal Grandmother    Arthritis Paternal Grandfather    Hearing loss Paternal Grandfather    Heart disease Paternal Grandfather    Hyperlipidemia Paternal Grandfather    Hypertension Paternal Grandfather    Kidney disease Paternal Grandfather    Drug abuse Brother     Social History Social History[1]   Allergies   Penicillins   Review of Systems Review of Systems Per HPI  Physical Exam Triage Vital Signs ED Triage Vitals [03/14/24 1344]  Encounter Vitals Group     BP (!) 156/84     Girls Systolic BP Percentile      Girls Diastolic BP Percentile      Boys Systolic BP Percentile      Boys Diastolic BP Percentile      Pulse Rate 79     Resp 16     Temp 98 F (36.7 C)     Temp Source Oral     SpO2 96 %     Weight      Height      Head Circumference      Peak Flow      Pain Score 5     Pain Loc      Pain Education      Exclude from Growth Chart    No data found.  Updated Vital Signs BP (!) 156/84   Pulse 79   Temp 98 F (36.7 C) (Oral)   Resp 16   SpO2 96%   Visual Acuity Right Eye Distance:   Left Eye Distance:    Bilateral Distance:    Right Eye Near:   Left Eye Near:    Bilateral Near:     Physical Exam Vitals and nursing note reviewed.  Constitutional:      General: He is not in acute distress.    Appearance: Normal appearance. He is not ill-appearing or toxic-appearing.  HENT:     Head: Normocephalic and atraumatic.  Right Ear: Tympanic membrane, ear canal and external ear normal.     Left Ear: Tympanic membrane, ear canal and external ear normal.     Nose: Congestion present. No rhinorrhea.     Right Sinus: Maxillary sinus tenderness present. No frontal sinus tenderness.     Left Sinus: Maxillary sinus tenderness present. No frontal sinus tenderness.     Mouth/Throat:     Mouth: Mucous membranes are moist.     Pharynx: Oropharynx is clear. No oropharyngeal exudate or posterior oropharyngeal erythema.  Eyes:     General: No scleral icterus.    Extraocular Movements: Extraocular movements intact.  Cardiovascular:     Rate and Rhythm: Normal rate and regular rhythm.  Pulmonary:     Effort: Pulmonary effort is normal. No respiratory distress.     Breath sounds: Normal breath sounds. No wheezing, rhonchi or rales.  Musculoskeletal:       Arms:     Cervical back: Normal range of motion and neck supple.     Comments: Pain to palpation in area marked.  No redness, bruising, swelling.  Lungs clear to auscultation. Full range of motion of the left arm.  Normal strength and sensation bilateral upper extremities and bilateral upper extremities are neurovascularly intact distally.  Lymphadenopathy:     Cervical: No cervical adenopathy.  Skin:    General: Skin is warm and dry.     Coloration: Skin is not jaundiced or pale.     Findings: No erythema or rash.  Neurological:     Mental Status: He is alert and oriented to person, place, and time.  Psychiatric:        Behavior: Behavior is cooperative.      UC Treatments / Results  Labs (all labs ordered are listed, but only abnormal  results are displayed) Labs Reviewed - No data to display  EKG   Radiology No results found.  Procedures Procedures (including critical care time)  Medications Ordered in UC Medications - No data to display  Initial Impression / Assessment and Plan / UC Course  I have reviewed the triage vital signs and the nursing notes.  Pertinent labs & imaging results that were available during my care of the patient were reviewed by me and considered in my medical decision making (see chart for details).   Patient is a pleasant, well-appearing 43 year old male presenting today for headache, facial pressure, and acute thoracic back pain.  Patient is mildly hypertensive in triage today, otherwise vital signs are stable.  Examination is consistent with bacterial sinusitis.  Treat with doxycycline  twice daily for 7 days given allergy to penicillins.  Also recommended guaifenesin, saline nasal rinses/spray, and continue Flonase .  Suspect thoracic back strain.  No red flags.  Treat with muscle relaxant, light range of motion and stretching exercises.  ER and return precautions discussed with patient.  The patient was given the opportunity to ask questions.  All questions answered to their satisfaction.  The patient is in agreement to this plan.   Final Clinical Impressions(s) / UC Diagnoses   Final diagnoses:  Acute bacterial sinusitis  Strain of thoracic back region     Discharge Instructions      You have a sinus infection.  Take the doxycycline  twice daily for 7 days as prescribed to treat it.  Also recommend continue Mucinex 600 mg twice daily and nasal saline rinses.  Ensure you are staying well hydrated with water.   The pain in your back is consistent with a  muscle strain.  Recommend light range of motion and stretching exercises.  You can take the Flexeril  up to twice daily as needed for muscular pain.  Seek care if symptoms do not improve or if they worsen with treatment.     ED  Prescriptions     Medication Sig Dispense Auth. Provider   cyclobenzaprine  (FLEXERIL ) 10 MG tablet Take 1 tablet (10 mg total) by mouth 2 (two) times daily as needed for muscle spasms. Do not take with alcohol or while driving or operating heavy machinery.  May cause drowsiness. 20 tablet Chandra Raisin A, NP   doxycycline  (VIBRAMYCIN ) 100 MG capsule Take 1 capsule (100 mg total) by mouth 2 (two) times daily for 7 days. 14 capsule Chandra Raisin LABOR, NP      PDMP not reviewed this encounter.     [1]  Social History Tobacco Use   Smoking status: Never   Smokeless tobacco: Never  Vaping Use   Vaping status: Never Used  Substance Use Topics   Alcohol use: Never   Drug use: Never     Chandra Raisin LABOR, NP 03/14/24 1447  "

## 2024-03-27 ENCOUNTER — Other Ambulatory Visit: Payer: Self-pay | Admitting: Family Medicine

## 2024-03-27 MED ORDER — SEMAGLUTIDE (1 MG/DOSE) 4 MG/3ML ~~LOC~~ SOPN: 1.0000 mg | PEN_INJECTOR | SUBCUTANEOUS | 1 refills | Status: DC

## 2024-03-27 NOTE — Telephone Encounter (Signed)
 Copied from CRM [id number]. Topic: Clinical - Medication Refill >> Mar 27, 2024 11:24 AM Carlyon D wrote: Medication: Semaglutide , 1 MG/DOSE, 4 MG/3ML SOPN  Has the patient contacted their pharmacy? Yes (Agent: If no, request that the patient contact the pharmacy for the refill. If patient does not wish to contact the pharmacy document the reason why and proceed with request.) (Agent: If yes, when and what did the pharmacy advise?)  This is the patient's preferred pharmacy:  [id number] - [place], [place] - [address] [place] [address] Phone: [phone] Fax: [phone]  Is this the correct pharmacy for this prescription? Yes If no, delete pharmacy and type the correct one.   Has the prescription been filled recently? Last month   Is the patient out of the medication? Yes  Has the patient been seen for an appointment in the last year OR does the patient have an upcoming appointment? Yes  Can we respond through MyChart? Yes  Agent: Please be advised that Rx refills may take up to 3 business days. We ask that you follow-up with your pharmacy.

## 2024-04-06 ENCOUNTER — Telehealth: Admitting: Physician Assistant

## 2024-04-06 DIAGNOSIS — J020 Streptococcal pharyngitis: Secondary | ICD-10-CM | POA: Diagnosis not present

## 2024-04-06 MED ORDER — AZITHROMYCIN 250 MG PO TABS: ORAL_TABLET | ORAL | 0 refills | Status: AC

## 2024-04-06 NOTE — Progress Notes (Signed)

## 2024-04-27 ENCOUNTER — Ambulatory Visit (INDEPENDENT_AMBULATORY_CARE_PROVIDER_SITE_OTHER): Admitting: Family Medicine

## 2024-04-27 ENCOUNTER — Encounter: Payer: Self-pay | Admitting: Family Medicine

## 2024-04-27 VITALS — BP 122/78 | HR 75 | Temp 98.0°F | Ht 74.0 in | Wt 358.6 lb

## 2024-04-27 DIAGNOSIS — J4520 Mild intermittent asthma, uncomplicated: Secondary | ICD-10-CM

## 2024-04-27 DIAGNOSIS — F902 Attention-deficit hyperactivity disorder, combined type: Secondary | ICD-10-CM

## 2024-04-27 DIAGNOSIS — I10 Essential (primary) hypertension: Secondary | ICD-10-CM

## 2024-04-27 DIAGNOSIS — K219 Gastro-esophageal reflux disease without esophagitis: Secondary | ICD-10-CM

## 2024-04-27 DIAGNOSIS — Z5181 Encounter for therapeutic drug level monitoring: Secondary | ICD-10-CM

## 2024-04-27 DIAGNOSIS — E1169 Type 2 diabetes mellitus with other specified complication: Secondary | ICD-10-CM

## 2024-04-27 DIAGNOSIS — Z7985 Long-term (current) use of injectable non-insulin antidiabetic drugs: Secondary | ICD-10-CM

## 2024-04-27 DIAGNOSIS — Z23 Encounter for immunization: Secondary | ICD-10-CM

## 2024-04-27 LAB — POCT GLYCOSYLATED HEMOGLOBIN (HGB A1C)
HbA1c POC (<> result, manual entry): 5.4 %
Hemoglobin A1C: 5.4 % (ref 4.0–5.6)

## 2024-04-27 MED ORDER — AMPHETAMINE-DEXTROAMPHETAMINE 20 MG PO TABS: 10.0000 mg | ORAL_TABLET | Freq: Two times a day (BID) | ORAL | 0 refills | Status: AC

## 2024-04-27 MED ORDER — SEMAGLUTIDE (2 MG/DOSE) 8 MG/3ML ~~LOC~~ SOPN: 2.0000 mg | PEN_INJECTOR | SUBCUTANEOUS | 5 refills | Status: AC

## 2024-04-27 MED ORDER — IRBESARTAN 300 MG PO TABS: 300.0000 mg | ORAL_TABLET | Freq: Every day | ORAL | 1 refills | Status: AC

## 2024-04-27 MED ORDER — OMEPRAZOLE 20 MG PO CPDR: 20.0000 mg | DELAYED_RELEASE_CAPSULE | Freq: Every day | ORAL | 1 refills | Status: AC

## 2024-04-27 MED ORDER — ALBUTEROL SULFATE HFA 108 (90 BASE) MCG/ACT IN AERS: INHALATION_SPRAY | RESPIRATORY_TRACT | 5 refills | Status: AC

## 2024-04-27 NOTE — Progress Notes (Signed)
 "     Patient ID: Nicholas Anthony, male  DOB: May 03, 1980, 44 y.o.   MRN: [id number] Patient Care Team    Relationship Specialty Notifications Start End  Catherine Charlies LABOR, DO PCP - General Family Medicine  09/12/17   Donna Lamar, DPM  Podiatry  01/20/24   [facility]    01/20/24     Chief Complaint  Patient presents with   Diabetes    [facility] in [place]    Subjective: Nicholas Anthony is a 44 y.o. male present for chronic Conditions/illness Management All past medical history, surgical history, allergies, family history, immunizations, medications and social history were updated in the electronic medical record today. All recent labs, ED visits and hospitalizations within the last year were reviewed. Medication reconciliation completed today  Hypertension/morbid obesity/ophthalmological abnormality:  Patient reports compliance with   irbesartan  300mg  qd.  Patient denies chest pain, shortness of breath, dizziness or lower extremity edema.   Pt does not take a  daily baby ASA. Pt is is not  prescribed statin. Diet: low sodium Exercise: encouraged routinely.  RF: HTN, obesity, Fhx heart disease  GERD: Symptoms well-controlled with omeprazole  20 mg daily.  ADHD: Patient reports compliance with Adderall 10 mg twice daily, he feels this medication is working well and would like to continue.  He has not experienced any negative side effects.  It is helping focus.  Type 2 diabetes mellitus with other specified complication, without long-term current use of insulin (HCC) (Primary) Diagnosis 2025 A1c 6.7 He reports he is tolerating Ozempic  1 mg weekly injection.  The first dose did not make his stomach upset. Patient denies dizziness, hyperglycemic or hypoglycemic events. Patient denies numbness, tingling in the extremities or nonhealing wounds of feet.    Mild intermittent asthma without complication Asthma has been stable.  She requiring  albuterol . .     04/27/2024    7:51 AM 10/03/2023    9:08 AM 04/14/2023    8:45 AM 09/27/2022    8:30 AM 10/20/2021    1:08 PM  Depression screen PHQ 2/9  Decreased Interest 0 0 0 0 0  Down, Depressed, Hopeless 0 0 0 0 0  PHQ - 2 Score 0 0 0 0 0  Altered sleeping 0 0  0   Tired, decreased energy 0 0  0   Change in appetite 0 0  0   Feeling bad or failure about yourself  0 0  0   Trouble concentrating 0 0  0   Moving slowly or fidgety/restless 0 0  0   Suicidal thoughts 0 0  0   PHQ-9 Score 0 0   0    Difficult doing work/chores Not difficult at all Not difficult at all  Not difficult at all      Data saved with a previous flowsheet row definition      10/03/2023    9:08 AM 09/27/2022    8:30 AM 07/25/2020    1:40 PM 07/24/2019    3:02 PM  GAD 7 : Generalized Anxiety Score  Nervous, Anxious, on Edge 0  0  0  1   Control/stop worrying 0  0  0  0   Worry too much - different things 0  0  0  1   Trouble relaxing 0  0  0  3   Restless 0  0  0  3   Easily annoyed or irritable 0  0  0  0  Afraid - awful might happen 0  0  0  0   Total GAD 7 Score 0 0 0 8  Anxiety Difficulty Not difficult at all Not difficult at all  Somewhat difficult     Data saved with a previous flowsheet row definition           04/27/2024    7:51 AM 10/03/2023    9:08 AM 04/14/2023    8:45 AM 09/27/2022    8:29 AM 09/12/2017    9:37 AM  Fall Risk   Falls in the past year? 0 0 0 0 No   Number falls in past yr: 0  0    Injury with Fall? 0  0  0    Risk for fall due to : No Fall Risks  No Fall Risks No Fall Risks   Follow up Falls evaluation completed Falls evaluation completed Falls evaluation completed Falls evaluation completed      Data saved with a previous flowsheet row definition    Immunization History  Administered Date(s) Administered   DTaP 09/01/1981, 11/03/1981, 12/29/1981, 07/19/1984, 07/17/1986   IPV 09/01/1981, 11/03/1981, 03/02/1982, 07/17/1986   Influenza Split 03/30/2014   Influenza,  Seasonal, Injecte, Preservative Fre 04/14/2023, 01/20/2024   Influenza,inj,Quad PF,6+ Mos 11/25/2017, 12/14/2018, 01/02/2021, 04/12/2022   MMR 10/12/1982   PNEUMOCOCCAL CONJUGATE-20 04/14/2023   PPD Test 10/20/2021   Tdap 03/30/2014, 04/27/2024    Past Medical History:  Diagnosis Date   Allergy    Anal fissure    Asthma    Attention deficit 11/25/2017   Bilateral inguinal hernia (BIH) 05/21/2019   Calcaneal spur of foot, right 06/17/2022   Chicken pox    Diabetes mellitus (HCC) 12/07/2023   GERD (gastroesophageal reflux disease)    HTN (hypertension)    Mild anxiety    Plantar fasciitis, chronic 08/27/2020   Electing to have surgery after failure of non-surgical options     Prediabetes 10/03/2017   Retinal hemorrhage 09/02/2017   Tick bite 12/14/2018   Allergies  Allergen Reactions   Penicillins Rash    Rash only-as a child    Past Surgical History:  Procedure Laterality Date   HERNIA REPAIR     PLANTAR FASCIA RELEASE     SPHINCTEROTOMY  2020   Family History  Problem Relation Age of Onset   Diabetes Father    Heart disease Father    Hyperlipidemia Father    Hypertension Father    Drug abuse Sister    Alcohol abuse Sister    Arthritis Paternal Grandmother    Depression Paternal Grandmother    Diabetes Paternal Grandmother    Hyperlipidemia Paternal Grandmother    Heart disease Paternal Grandmother    Hypertension Paternal Grandmother    Arthritis Paternal Grandfather    Hearing loss Paternal Grandfather    Heart disease Paternal Grandfather    Hyperlipidemia Paternal Grandfather    Hypertension Paternal Grandfather    Kidney disease Paternal Grandfather    Drug abuse Brother    Social History   Social History Narrative   Married, employed,    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    Drinks caffeine.    Smoke alarm in the home. Wears his seatbelt.    Feels safe in his relationship.     Allergies as of 04/27/2024       Reactions    Penicillins Rash   Rash only-as a child        Medication List  Accurate as of April 27, 2024  4:29 PM. If you have any questions, ask your nurse or doctor.          STOP taking these medications    Ozempic  (0.25 or 0.5 MG/DOSE) 2 MG/3ML Sopn Generic drug: Semaglutide (0.25 or 0.5MG /DOS) Replaced by: Semaglutide  (2 MG/DOSE) 8 MG/3ML Sopn Stopped by: Charlies Bellini, DO   Semaglutide  (1 MG/DOSE) 4 MG/3ML Sopn Stopped by: Taegan Haider, DO       TAKE these medications    albuterol  108 (90 Base) MCG/ACT inhaler Commonly known as: VENTOLIN  HFA INHALE 2 PUFFS BY MOUTH EVERY 4 HOURS AS NEEDED FOR WHEEZING OR SHORTNESS OF BREATH   amphetamine -dextroamphetamine  20 MG tablet Commonly known as: ADDERALL Take 0.5 tablets (10 mg total) by mouth 2 (two) times daily. What changed: Another medication with the same name was changed. Make sure you understand how and when to take each. Changed by: Charlies Bellini, DO   amphetamine -dextroamphetamine  20 MG tablet Commonly known as: ADDERALL Take 0.5 tablets (10 mg total) by mouth 2 (two) times daily. Start taking on: July 11, 2024 What changed: These instructions start on July 11, 2024. If you are unsure what to do until then, ask your doctor or other care provider. Changed by: Charlies Bellini, DO   cyclobenzaprine  10 MG tablet Commonly known as: FLEXERIL  Take 1 tablet (10 mg total) by mouth 2 (two) times daily as needed for muscle spasms. Do not take with alcohol or while driving or operating heavy machinery.  May cause drowsiness.   fluticasone  50 MCG/ACT nasal spray Commonly known as: FLONASE  USE 2 SPRAY IN EACH NOSTRIL DAILY   irbesartan  300 MG tablet Commonly known as: Avapro  Take 1 tablet (300 mg total) by mouth daily.   omeprazole  20 MG capsule Commonly known as: PRILOSEC Take 1 capsule (20 mg total) by mouth daily.   ondansetron  4 MG tablet Commonly known as: ZOFRAN  Take 1 tablet (4 mg total) by mouth every 8  (eight) hours as needed for nausea or vomiting.   Semaglutide  (2 MG/DOSE) 8 MG/3ML Sopn Inject 2 mg as directed once a week. Replaces: Ozempic  (0.25 or 0.5 MG/DOSE) 2 MG/3ML Sopn Started by: Charlies Bellini, DO       All past medical history, surgical history, allergies, family history, immunizations andmedications were updated in the EMR today and reviewed under the history and medication portions of their EMR.     No results found.  ROS: 14 pt review of systems performed and negative (unless mentioned in an HPI)  Objective: BP 122/78   Pulse 75   Temp 98 F (36.7 C)   Ht 6' 2 (1.88 m)   Wt (!) 358 lb 9.6 oz (162.7 kg)   SpO2 98%   BMI 46.04 kg/m  Physical Exam Vitals and nursing note reviewed. Exam conducted with a chaperone present.  Constitutional:      General: He is not in acute distress.    Appearance: Normal appearance. He is obese. He is not ill-appearing, toxic-appearing or diaphoretic.  HENT:     Head: Normocephalic and atraumatic.  Eyes:     General: No scleral icterus.       Right eye: No discharge.        Left eye: No discharge.     Extraocular Movements: Extraocular movements intact.     Conjunctiva/sclera: Conjunctivae normal.     Pupils: Pupils are equal, round, and reactive to light.  Cardiovascular:     Rate and Rhythm: Normal rate and regular  rhythm.     Heart sounds: No murmur heard. Pulmonary:     Effort: Pulmonary effort is normal. No respiratory distress.     Breath sounds: Normal breath sounds. No wheezing, rhonchi or rales.  Musculoskeletal:     Cervical back: Neck supple.     Right lower leg: No edema.     Left lower leg: No edema.  Skin:    General: Skin is warm.     Findings: No lesion or rash.  Neurological:     Mental Status: He is alert and oriented to person, place, and time. Mental status is at baseline.  Psychiatric:        Mood and Affect: Mood normal.        Behavior: Behavior normal.        Thought Content: Thought content  normal.        Judgment: Judgment normal.    Diabetic Foot Exam - Simple   Simple Foot Form Diabetic Foot exam was performed with the following findings: Yes 04/27/2024  8:19 AM  Visual Inspection No deformities, no ulcerations, no other skin breakdown bilaterally: Yes Sensation Testing Intact to touch and monofilament testing bilaterally: Yes Pulse Check Posterior Tibialis and Dorsalis pulse intact bilaterally: Yes Comments     Assessment/plan: Nicholas Anthony is a 44 y.o. male present for Chronic Conditions/illness Management Hypertension/obesity:  Stable Continue Avapro  to 300 mg qd - Continue diet and weight loss regimen, routine exercise.  -Low sodium diet.   Gastroesophageal reflux disease without esophagitis/long-term proton pump use Stable Continue protonix B12, vitamin D  and magnesium levels UTD 03/2023 with extremely low vitamin D .  Vitamin D  deficiency Patient had been encouraged to start vitamin D  3 2000 units daily OTC-taking Vitamin D  collected  UTD  Diabetes type 2-morbid obesity Last A1c 6.5 >6.7>5.4 collected today LOW Glycemic diet encouraged. Increase  Ozempic  1> 1.7 mg weekly Microalbumin: Collected 01/20/2024 Eye exam: UTD-[facility] in [place]-records requested for end of 2025 Prevnar: Completed 03/2023 Influenza: Completed 2025  Elevated LFTs Mildly elevated LFTs, most likely fatty liver disease. CMP UTD  ADHD: Stable Continue Adderall 10 mg twice daily.  Adderall 20 mg tab half tab p.o. twice daily#90 x2. [place]  controlled substance database reviewed today 04/27/24  Mild intermittent asthma: Stable Continue albuterol  as needed Need for diphtheria-tetanus-pertussis (Tdap) vaccine - Tdap vaccine greater than or equal to 7yo IM    Return in about 15 weeks (around 08/10/2024) for cpe (20 min), Routine chronic condition follow-up.  Orders Placed This Encounter  Procedures   Tdap vaccine greater than or equal to 7yo IM    POCT glycosylated hemoglobin (Hb A1C)   Meds ordered this encounter  Medications   irbesartan  (AVAPRO ) 300 MG tablet    Sig: Take 1 tablet (300 mg total) by mouth daily.    Dispense:  90 tablet    Refill:  1   omeprazole  (PRILOSEC) 20 MG capsule    Sig: Take 1 capsule (20 mg total) by mouth daily.    Dispense:  90 capsule    Refill:  1   albuterol  (VENTOLIN  HFA) 108 (90 Base) MCG/ACT inhaler    Sig: INHALE 2 PUFFS BY MOUTH EVERY 4 HOURS AS NEEDED FOR WHEEZING OR SHORTNESS OF BREATH    Dispense:  8.5 g    Refill:  5   amphetamine -dextroamphetamine  (ADDERALL) 20 MG tablet    Sig: Take 0.5 tablets (10 mg total) by mouth 2 (two) times daily.    Dispense:  90  tablet    Refill:  0   amphetamine -dextroamphetamine  (ADDERALL) 20 MG tablet    Sig: Take 0.5 tablets (10 mg total) by mouth 2 (two) times daily.    Dispense:  90 tablet    Refill:  0   Semaglutide , 2 MG/DOSE, 8 MG/3ML SOPN    Sig: Inject 2 mg as directed once a week.    Dispense:  3 mL    Refill:  5   Referral Orders  No referral(s) requested today     Note is dictated utilizing voice recognition software. Although note has been proof read prior to signing, occasional typographical errors still can be missed. If any questions arise, please do not hesitate to call for verification.  Electronically signed by: Charlies Bellini, DO [facility]  "

## 2024-04-27 NOTE — Patient Instructions (Signed)

## 2024-08-17 ENCOUNTER — Encounter: Admitting: Family Medicine
